# Patient Record
Sex: Male | Born: 1950
Health system: Southern US, Community
[De-identification: ages and names within clinical notes are randomized; demographics above are authoritative.]

## PROBLEM LIST (undated history)

## (undated) DIAGNOSIS — I499 Cardiac arrhythmia, unspecified: Secondary | ICD-10-CM

## (undated) DIAGNOSIS — E7800 Pure hypercholesterolemia, unspecified: Secondary | ICD-10-CM

## (undated) DIAGNOSIS — Z87442 Personal history of urinary calculi: Secondary | ICD-10-CM

## (undated) DIAGNOSIS — R112 Nausea with vomiting, unspecified: Secondary | ICD-10-CM

## (undated) DIAGNOSIS — I10 Essential (primary) hypertension: Secondary | ICD-10-CM

## (undated) DIAGNOSIS — K219 Gastro-esophageal reflux disease without esophagitis: Secondary | ICD-10-CM

## (undated) DIAGNOSIS — Z9889 Other specified postprocedural states: Secondary | ICD-10-CM

## (undated) DIAGNOSIS — K759 Inflammatory liver disease, unspecified: Secondary | ICD-10-CM

## (undated) DIAGNOSIS — I4891 Unspecified atrial fibrillation: Secondary | ICD-10-CM

## (undated) HISTORY — PX: LITHOTRIPSY: SUR834

## (undated) HISTORY — PX: HYDROCELE EXCISION / REPAIR: SUR1145

## (undated) HISTORY — PX: STONE EXTRACTION WITH BASKET: SHX5318

## (undated) HISTORY — PX: CHOLECYSTECTOMY: SHX55

## (undated) HISTORY — DX: Unspecified atrial fibrillation: I48.91

## (undated) HISTORY — PX: TONSILLECTOMY: SUR1361

## (undated) HISTORY — DX: Essential (primary) hypertension: I10

## (undated) HISTORY — PX: APPENDECTOMY: SHX54

## (undated) HISTORY — DX: Pure hypercholesterolemia, unspecified: E78.00

---

## 1999-11-13 DIAGNOSIS — I499 Cardiac arrhythmia, unspecified: Secondary | ICD-10-CM

## 1999-11-13 HISTORY — DX: Cardiac arrhythmia, unspecified: I49.9

## 2009-03-28 ENCOUNTER — Encounter: Admission: RE | Admit: 2009-03-28 | Discharge: 2009-03-28 | Payer: Self-pay | Admitting: Family Medicine

## 2009-10-19 ENCOUNTER — Encounter
Admission: RE | Admit: 2009-10-19 | Discharge: 2009-10-19 | Payer: Self-pay | Admitting: Physical Medicine and Rehabilitation

## 2010-12-03 ENCOUNTER — Encounter: Payer: Self-pay | Admitting: Physical Medicine and Rehabilitation

## 2014-12-20 DIAGNOSIS — E782 Mixed hyperlipidemia: Secondary | ICD-10-CM | POA: Diagnosis not present

## 2014-12-31 DIAGNOSIS — Z6827 Body mass index (BMI) 27.0-27.9, adult: Secondary | ICD-10-CM | POA: Diagnosis not present

## 2015-01-05 DIAGNOSIS — Z6827 Body mass index (BMI) 27.0-27.9, adult: Secondary | ICD-10-CM | POA: Diagnosis not present

## 2015-01-05 DIAGNOSIS — R05 Cough: Secondary | ICD-10-CM | POA: Diagnosis not present

## 2015-01-05 DIAGNOSIS — J0110 Acute frontal sinusitis, unspecified: Secondary | ICD-10-CM | POA: Diagnosis not present

## 2015-08-03 ENCOUNTER — Ambulatory Visit: Payer: Self-pay | Admitting: Orthopedic Surgery

## 2015-08-03 NOTE — Patient Instructions (Addendum)
Bernie Ransford  08/03/2015   Your procedure is scheduled on: 08-05-15 Friday  Report to [facility]  Entrance take [facility]  elevators to 3rd floor to  [place] at    0830 AM.  Call this number if you have problems the morning of surgery [phone]   Remember: ONLY 1 PERSON MAY GO WITH YOU TO SHORT STAY TO GET  READY MORNING OF [place].  Do not eat food or drink liquids :After Midnight.     Take these medicines the morning of surgery with A SIP OF WATER: none  DO NOT TAKE ANY DIABETIC MEDICATIONS DAY OF YOUR SURGERY                               You may not have any metal on your body including hair pins and              piercings  Do not wear jewelry, make-up, lotions, powders or perfumes, deodorant             Do not wear nail polish.  Do not shave  48 hours prior to surgery.              Men may shave face and neck.   Do not bring valuables to the hospital. [place].  Contacts, dentures or bridgework may not be worn into surgery.  Leave suitcase in the car. After surgery it may be brought to your room.     Patients discharged the day of surgery will not be allowed to drive home.  Name and phone number of your driver:  Special Instructions: N/[facility] - Preparing for Surgery Before surgery, you can play an important role.  Because skin is not sterile, your skin needs to be as free of germs as possible.  You can reduce the number of germs on your skin by washing with CHG (chlorahexidine gluconate) soap before surgery.  CHG is an antiseptic cleaner which kills germs and bonds with the skin to continue killing germs even after washing. Please DO NOT use if you have an allergy to CHG or antibacterial soaps.  If your skin becomes reddened/irritated stop using the CHG and inform your nurse when you arrive at Short Stay. Do not shave (including legs and underarms) for at least 48 hours prior to  the first CHG shower.  You may shave your face/neck. Please follow these instructions carefully:  1.  Shower with CHG Soap the night before surgery and the  morning of Surgery.  2.  If you choose to wash your hair, wash your hair first as usual with your  normal  shampoo.  3.  After you shampoo, rinse your hair and body thoroughly to remove the  shampoo.                           4.  Use CHG as you would any other liquid soap.  You can apply chg directly  to the skin and wash                       Gently with a scrungie or clean washcloth.  5.  Apply the CHG  Soap to your body ONLY FROM THE NECK DOWN.   Do not use on face/ open                           Wound or open sores. Avoid contact with eyes, ears mouth and genitals (private parts).                       Wash face,  Genitals (private parts) with your normal soap.             6.  Wash thoroughly, paying special attention to the area where your surgery  will be performed.  7.  Thoroughly rinse your body with warm water from the neck down.  8.  DO NOT shower/wash with your normal soap after using and rinsing off  the CHG Soap.                9.  Pat yourself dry with a clean towel.            10.  Wear clean pajamas.            11.  Place clean sheets on your bed the night of your first shower and do not  sleep with pets. Day of Surgery : Do not apply any lotions/deodorants the morning of surgery.  Please wear clean clothes to the hospital/surgery center.  FAILURE TO FOLLOW THESE INSTRUCTIONS MAY RESULT IN THE CANCELLATION OF YOUR SURGERY PATIENT SIGNATURE_________________________________  NURSE SIGNATURE__________________________________  ________________________________________________________________________               Please read over the following fact sheets you were given: _____________________________________________________________________

## 2015-08-03 NOTE — Progress Notes (Signed)
Please put orders in Epic surgery 08-05-15 pre op 08-04-15 Thanks

## 2015-08-03 NOTE — H&P (Signed)
Alexander Bowen is an 64 y.o. male.   Chief Complaint: left knee pain HPI: The patient is a 64 year old male who presents to the practice today for a transition into care. The patient is transitioning into care from another physician and a summary of care was reviewed .  The patient reports left knee symptoms including: swelling which began 8 day(s) ago following a specific injury. The injury occurred 07/20/2015 due to a fall while the patient was at work. The patient describes the severity of the symptoms as 0 / 10 on an analog pain scale. Prior to being seen today the patient was previously evaluated in urgent care [facility] Previous work-up for this problem has included knee x-rays. Past treatment for this problem has included application of ice, knee immobilizer and nonsteroidal anti-inflammatory drugs (Ibuprofen). Current treatment includes application of ice and knee immobilizer (and crutches). Note for "Knee pain": The patient is out of work.  No past medical history on file.  No past surgical history on file.  No family history on file. Social History:  has no tobacco, alcohol, and drug history on file.  Allergies: Allergies not on file   (Not in a hospital admission)  No results found for this or any previous visit (from the past 48 hour(s)). No results found.  Review of Systems  Constitutional: Negative.   HENT: Negative.   Eyes: Negative.   Respiratory: Negative.   Cardiovascular: Negative.   Gastrointestinal: Negative.   Genitourinary: Negative.   Musculoskeletal: Positive for joint pain.  Skin: Negative.   Neurological: Negative.   Psychiatric/Behavioral: Negative.     There were no vitals taken for this visit. Physical Exam  Constitutional: He is oriented to person, place, and time. He appears well-developed. He appears distressed.  HENT:  Head: Normocephalic.  Eyes: Pupils are equal, round, and reactive to light.  Neck: Normal range of motion.  Cardiovascular:  Normal rate.   Respiratory: Effort normal.  GI: Soft.  Musculoskeletal:  On examination of the knee, he has moderate effusion, prepatellar swelling is noted as well. There is a healing abrasion over the anterior aspect of the knee. He is neurovascularly intact without evidence of DVT. The ipsilateral hip and ankle exam is unremarkable.  Outside medical records were reviewed. He had a fall onto his right hand, he had fallen at work onto his knee and then fracture comminuted of the patella was noted. He has been placed in a knee immobilizer.  He is tender to palpation over the patella, nontender posteriorly. No evidence of DVT.  Compartments are soft.  Neurological: He is alert and oriented to person, place, and time.    AP and lateral radiographs today demonstrate a comminuted displaced patella fracture, minimum of four part with incongruity within the joint itself.  Assessment/Plan One week status post fall at work with a closed patella fracture with the residual soft tissue swelling, anterior abrasion, neurologically intact.  Extensive discussion with Mr. Baetz concerning his current pathology, relevant anatomy and treatment options. At this point in time, we discussed the patellar fracture and displacement and the requirement for a surgical reconstruction, to reapproximate the patella, to reconstitute the quadriceps tendon mechanism. He does have residual soft tissue swelling, we discussed elevation, isometric quads. We will have him set up for an open reduction and internal fixation next week to get some continued reduction into the soft tissue swelling and healing of the abrasion, which is right over the anterior aspect of the knee in the region  of the proposed incision. Out of work. He is having minimal pain, he is taking minimal analgesics. Aspirin for DVT prophylaxis. He is otherwise healthy. No chest pain, shortness of breath, history of heart attack, MRSA infection, clots, diabetes or  stroke. He is here with a friend of his. We discussed obtaining a brace afterwards. He will be weightbearing in an extended brace. We will begin protected range of motion. We did discuss the probable three to four months until the maximum medical improvement and the probable residual restriction in flexion, the possibility of manipulation. 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 I would recommend tobacco cessation indicating deleterious side effects upon healing. He understands. Any change in the interim, he is to call, we will get approval from his workmen's compensation facility.  Plan left patella ORIF  BISSELL, JACLYN M. PA-C for Dr. Tonita Cong 08/03/2015, 5:05 PM

## 2015-08-04 ENCOUNTER — Ambulatory Visit (HOSPITAL_COMMUNITY)
Admission: RE | Admit: 2015-08-04 | Discharge: 2015-08-04 | Disposition: A | Discharge status: Home / Self Care | Payer: Worker's Compensation | Source: Ambulatory Visit | Attending: Orthopedic Surgery | Admitting: Orthopedic Surgery

## 2015-08-04 ENCOUNTER — Encounter (HOSPITAL_COMMUNITY): Payer: Self-pay

## 2015-08-04 ENCOUNTER — Encounter (HOSPITAL_COMMUNITY)
Admission: RE | Admit: 2015-08-04 | Discharge: 2015-08-04 | Disposition: A | Discharge status: Home / Self Care | Payer: Worker's Compensation | Source: Ambulatory Visit | Attending: Specialist | Admitting: Specialist

## 2015-08-04 DIAGNOSIS — Y990 Civilian activity done for income or pay: Secondary | ICD-10-CM | POA: Diagnosis not present

## 2015-08-04 DIAGNOSIS — K219 Gastro-esophageal reflux disease without esophagitis: Secondary | ICD-10-CM | POA: Diagnosis not present

## 2015-08-04 DIAGNOSIS — Z01811 Encounter for preprocedural respiratory examination: Secondary | ICD-10-CM

## 2015-08-04 DIAGNOSIS — M25562 Pain in left knee: Secondary | ICD-10-CM | POA: Diagnosis present

## 2015-08-04 DIAGNOSIS — S82042A Displaced comminuted fracture of left patella, initial encounter for closed fracture: Secondary | ICD-10-CM | POA: Diagnosis not present

## 2015-08-04 DIAGNOSIS — W19XXXA Unspecified fall, initial encounter: Secondary | ICD-10-CM | POA: Diagnosis not present

## 2015-08-04 HISTORY — DX: Gastro-esophageal reflux disease without esophagitis: K21.9

## 2015-08-04 HISTORY — DX: Other specified postprocedural states: Z98.890

## 2015-08-04 HISTORY — DX: Nausea with vomiting, unspecified: R11.2

## 2015-08-04 HISTORY — DX: Cardiac arrhythmia, unspecified: I49.9

## 2015-08-04 LAB — BASIC METABOLIC PANEL
Anion gap: 7 (ref 5–15)
BUN: 15 mg/dL (ref 6–20)
CO2: 27 mmol/L (ref 22–32)
Calcium: 9.3 mg/dL (ref 8.9–10.3)
Chloride: 104 mmol/L (ref 101–111)
Creatinine, Ser: 0.91 mg/dL (ref 0.61–1.24)
GFR calc Af Amer: >60 mL/min (ref >60)
GFR calc non Af Amer: >60 mL/min (ref >60)
Glucose, Bld: 92 mg/dL (ref 65–99)
Potassium: 4.3 mmol/L (ref 3.5–5.1)
Sodium: 138 mmol/L (ref 135–145)

## 2015-08-04 LAB — CBC
HCT: 43.4 % (ref 39.0–52.0)
Hemoglobin: 15.3 g/dL (ref 13.0–17.0)
MCH: 31.5 pg (ref 26.0–34.0)
MCHC: 35.3 g/dL (ref 30.0–36.0)
MCV: 89.5 fL (ref 78.0–100.0)
Platelets: 266 10*3/uL (ref 150–400)
RBC: 4.85 MIL/uL (ref 4.22–5.81)
RDW: 13.8 % (ref 11.5–15.5)
WBC: 8.6 10*3/uL (ref 4.0–10.5)

## 2015-08-05 ENCOUNTER — Encounter (HOSPITAL_COMMUNITY)
Admission: RE | Disposition: A | Discharge status: Home / Self Care | Payer: Self-pay | Source: Ambulatory Visit | Attending: Specialist

## 2015-08-05 ENCOUNTER — Ambulatory Visit (HOSPITAL_COMMUNITY)
Admission: RE | Admit: 2015-08-05 | Discharge: 2015-08-06 | Disposition: A | Discharge status: Home / Self Care | Payer: Worker's Compensation | Source: Ambulatory Visit | Attending: Specialist | Admitting: Specialist

## 2015-08-05 ENCOUNTER — Ambulatory Visit (HOSPITAL_COMMUNITY): Payer: Worker's Compensation | Admitting: Anesthesiology

## 2015-08-05 ENCOUNTER — Encounter (HOSPITAL_COMMUNITY): Payer: Self-pay | Admitting: *Deleted

## 2015-08-05 ENCOUNTER — Ambulatory Visit (HOSPITAL_COMMUNITY): Payer: Worker's Compensation

## 2015-08-05 DIAGNOSIS — Y990 Civilian activity done for income or pay: Secondary | ICD-10-CM | POA: Insufficient documentation

## 2015-08-05 DIAGNOSIS — K219 Gastro-esophageal reflux disease without esophagitis: Secondary | ICD-10-CM | POA: Diagnosis not present

## 2015-08-05 DIAGNOSIS — S82009A Unspecified fracture of unspecified patella, initial encounter for closed fracture: Secondary | ICD-10-CM | POA: Diagnosis present

## 2015-08-05 DIAGNOSIS — W19XXXA Unspecified fall, initial encounter: Secondary | ICD-10-CM | POA: Insufficient documentation

## 2015-08-05 DIAGNOSIS — T148XXA Other injury of unspecified body region, initial encounter: Secondary | ICD-10-CM

## 2015-08-05 DIAGNOSIS — S82042A Displaced comminuted fracture of left patella, initial encounter for closed fracture: Secondary | ICD-10-CM | POA: Diagnosis not present

## 2015-08-05 HISTORY — DX: Unspecified fracture of unspecified patella, initial encounter for closed fracture: S82.009A

## 2015-08-05 HISTORY — PX: ORIF PATELLA: SHX5033

## 2015-08-05 SURGERY — OPEN REDUCTION INTERNAL FIXATION (ORIF) PATELLA
Anesthesia: General | Site: Knee | Laterality: Left

## 2015-08-05 MED ORDER — ONDANSETRON HCL 4 MG PO TABS: 4.0000 mg | ORAL_TABLET | Freq: Four times a day (QID) | ORAL | Status: DC | PRN

## 2015-08-05 MED ORDER — ACETAMINOPHEN 325 MG PO TABS: 650.0000 mg | ORAL_TABLET | Freq: Four times a day (QID) | ORAL | Status: DC | PRN

## 2015-08-05 MED ORDER — DEXAMETHASONE SODIUM PHOSPHATE 10 MG/ML IJ SOLN
INTRAMUSCULAR | Status: DC | PRN
  Administered 2015-08-05: 10 mg via INTRAVENOUS

## 2015-08-05 MED ORDER — FENTANYL CITRATE (PF) 250 MCG/5ML IJ SOLN
INTRAMUSCULAR | Status: AC
  Filled 2015-08-05: qty 25

## 2015-08-05 MED ORDER — METOCLOPRAMIDE HCL 5 MG PO TABS: 5.0000 mg | ORAL_TABLET | Freq: Three times a day (TID) | ORAL | Status: DC | PRN

## 2015-08-05 MED ORDER — BISACODYL 5 MG PO TBEC: 5.0000 mg | DELAYED_RELEASE_TABLET | Freq: Every day | ORAL | Status: DC | PRN

## 2015-08-05 MED ORDER — BUPIVACAINE-EPINEPHRINE (PF) 0.5% -1:200000 IJ SOLN
INTRAMUSCULAR | Status: AC
  Filled 2015-08-05: qty 30

## 2015-08-05 MED ORDER — MIDAZOLAM HCL 5 MG/5ML IJ SOLN
INTRAMUSCULAR | Status: DC | PRN
Start: 2015-08-05 — End: 2015-08-05
  Administered 2015-08-05: 2 mg via INTRAVENOUS

## 2015-08-05 MED ORDER — PROPOFOL 10 MG/ML IV BOLUS
INTRAVENOUS | Status: DC | PRN
  Administered 2015-08-05: 200 mg via INTRAVENOUS

## 2015-08-05 MED ORDER — LACTATED RINGERS IV SOLN: INTRAVENOUS | Status: DC

## 2015-08-05 MED ORDER — ONDANSETRON HCL 4 MG/2ML IJ SOLN: 4.0000 mg | Freq: Four times a day (QID) | INTRAMUSCULAR | Status: DC | PRN

## 2015-08-05 MED ORDER — HYDROMORPHONE HCL 1 MG/ML IJ SOLN
INTRAMUSCULAR | Status: AC
  Filled 2015-08-05: qty 1

## 2015-08-05 MED ORDER — EPHEDRINE SULFATE 50 MG/ML IJ SOLN
INTRAMUSCULAR | Status: DC | PRN
  Administered 2015-08-05: 10 mg via INTRAVENOUS

## 2015-08-05 MED ORDER — SODIUM CHLORIDE 0.9 % IR SOLN
Status: AC
  Filled 2015-08-05: qty 1

## 2015-08-05 MED ORDER — OXYCODONE HCL 5 MG PO TABS
5.0000 mg | ORAL_TABLET | ORAL | Status: DC | PRN
  Administered 2015-08-05 – 2015-08-06 (×4): 10 mg via ORAL
  Filled 2015-08-05 (×4): qty 2

## 2015-08-05 MED ORDER — ONDANSETRON HCL 4 MG/2ML IJ SOLN
INTRAMUSCULAR | Status: AC
  Filled 2015-08-05: qty 2

## 2015-08-05 MED ORDER — POTASSIUM CHLORIDE IN NACL 20-0.45 MEQ/L-% IV SOLN
INTRAVENOUS | Status: DC
  Administered 2015-08-05: 17:00:00 via INTRAVENOUS
  Filled 2015-08-05 (×2): qty 1000

## 2015-08-05 MED ORDER — BUPIVACAINE-EPINEPHRINE (PF) 0.25% -1:200000 IJ SOLN
INTRAMUSCULAR | Status: AC
  Filled 2015-08-05: qty 30

## 2015-08-05 MED ORDER — LACTATED RINGERS IV SOLN
INTRAVENOUS | Status: DC
  Administered 2015-08-05: 1000 mL via INTRAVENOUS
  Administered 2015-08-05: 13:00:00 via INTRAVENOUS

## 2015-08-05 MED ORDER — METOCLOPRAMIDE HCL 5 MG/ML IJ SOLN: 5.0000 mg | Freq: Three times a day (TID) | INTRAMUSCULAR | Status: DC | PRN

## 2015-08-05 MED ORDER — CEFAZOLIN SODIUM-DEXTROSE 2-3 GM-% IV SOLR
INTRAVENOUS | Status: AC
  Filled 2015-08-05: qty 50

## 2015-08-05 MED ORDER — MIDAZOLAM HCL 2 MG/2ML IJ SOLN
INTRAMUSCULAR | Status: AC
  Filled 2015-08-05: qty 4

## 2015-08-05 MED ORDER — LIDOCAINE HCL (CARDIAC) 20 MG/ML IV SOLN
INTRAVENOUS | Status: DC | PRN
  Administered 2015-08-05: 100 mg via INTRAVENOUS

## 2015-08-05 MED ORDER — DEXAMETHASONE SODIUM PHOSPHATE 10 MG/ML IJ SOLN
INTRAMUSCULAR | Status: AC
  Filled 2015-08-05: qty 1

## 2015-08-05 MED ORDER — ASPIRIN EC 81 MG PO TBEC: 325.0000 mg | DELAYED_RELEASE_TABLET | Freq: Two times a day (BID) | ORAL | Status: DC

## 2015-08-05 MED ORDER — SENNOSIDES-DOCUSATE SODIUM 8.6-50 MG PO TABS: 1.0000 | ORAL_TABLET | Freq: Every evening | ORAL | Status: DC | PRN

## 2015-08-05 MED ORDER — ONDANSETRON HCL 4 MG/2ML IJ SOLN
INTRAMUSCULAR | Status: DC | PRN
  Administered 2015-08-05: 4 mg via INTRAVENOUS

## 2015-08-05 MED ORDER — HYDROMORPHONE HCL 1 MG/ML IJ SOLN
0.5000 mg | INTRAMUSCULAR | Status: DC | PRN
  Administered 2015-08-05 (×3): 0.5 mg via INTRAVENOUS

## 2015-08-05 MED ORDER — ACETAMINOPHEN 650 MG RE SUPP: 650.0000 mg | Freq: Four times a day (QID) | RECTAL | Status: DC | PRN

## 2015-08-05 MED ORDER — CEFAZOLIN SODIUM-DEXTROSE 2-3 GM-% IV SOLR
2.0000 g | Freq: Four times a day (QID) | INTRAVENOUS | Status: AC
  Administered 2015-08-05 – 2015-08-06 (×3): 2 g via INTRAVENOUS
  Filled 2015-08-05 (×3): qty 50

## 2015-08-05 MED ORDER — OXYCODONE-ACETAMINOPHEN 5-325 MG PO TABS: 1.0000 | ORAL_TABLET | ORAL | Status: DC | PRN

## 2015-08-05 MED ORDER — ENOXAPARIN SODIUM 40 MG/0.4ML ~~LOC~~ SOLN
40.0000 mg | SUBCUTANEOUS | Status: DC
  Administered 2015-08-06: 40 mg via SUBCUTANEOUS
  Filled 2015-08-05 (×2): qty 0.4

## 2015-08-05 MED ORDER — HYDROMORPHONE HCL 1 MG/ML IJ SOLN: 1.0000 mg | INTRAMUSCULAR | Status: DC | PRN

## 2015-08-05 MED ORDER — MAGNESIUM CITRATE PO SOLN: 1.0000 | Freq: Once | ORAL | Status: DC | PRN

## 2015-08-05 MED ORDER — CEFAZOLIN SODIUM-DEXTROSE 2-3 GM-% IV SOLR
2.0000 g | INTRAVENOUS | Status: AC
  Administered 2015-08-05: 2 g via INTRAVENOUS

## 2015-08-05 MED ORDER — DOCUSATE SODIUM 100 MG PO CAPS
100.0000 mg | ORAL_CAPSULE | Freq: Two times a day (BID) | ORAL | Status: DC
  Administered 2015-08-05 – 2015-08-06 (×2): 100 mg via ORAL

## 2015-08-05 MED ORDER — PROPOFOL 10 MG/ML IV BOLUS
INTRAVENOUS | Status: AC
  Filled 2015-08-05: qty 20

## 2015-08-05 MED ORDER — DOCUSATE SODIUM 100 MG PO CAPS: 100.0000 mg | ORAL_CAPSULE | Freq: Two times a day (BID) | ORAL | Status: DC | PRN

## 2015-08-05 MED ORDER — FENTANYL CITRATE (PF) 100 MCG/2ML IJ SOLN
INTRAMUSCULAR | Status: DC | PRN
  Administered 2015-08-05 (×5): 50 ug via INTRAVENOUS

## 2015-08-05 MED ORDER — RISAQUAD PO CAPS
1.0000 | ORAL_CAPSULE | Freq: Every day | ORAL | Status: DC
  Filled 2015-08-05 (×2): qty 1

## 2015-08-05 MED ORDER — SODIUM CHLORIDE 0.9 % IR SOLN
Status: DC | PRN
  Administered 2015-08-05: 500 mL

## 2015-08-05 MED ORDER — LIDOCAINE HCL (CARDIAC) 20 MG/ML IV SOLN
INTRAVENOUS | Status: AC
  Filled 2015-08-05: qty 5

## 2015-08-05 MED ORDER — BUPIVACAINE-EPINEPHRINE (PF) 0.5% -1:200000 IJ SOLN
INTRAMUSCULAR | Status: DC | PRN
Start: 2015-08-05 — End: 2015-08-05
  Administered 2015-08-05: 20 mL

## 2015-08-05 SURGICAL SUPPLY — 54 items
ANCHOR SUT KEITH ABD SZ2 STR (Anchor) IMPLANT
BAG ZIPLOCK 12X15 (MISCELLANEOUS) IMPLANT
BANDAGE ELASTIC 6 VELCRO ST LF (GAUZE/BANDAGES/DRESSINGS) ×2 IMPLANT
BANDAGE ESMARK 6X9 LF (GAUZE/BANDAGES/DRESSINGS) ×1 IMPLANT
BIT DRILL 2.8X128 (BIT) ×2 IMPLANT
BIT DRILL GOLD JC 2.5X95 (BIT) ×2 IMPLANT
BNDG ESMARK 6X9 LF (GAUZE/BANDAGES/DRESSINGS) ×2
CLOTH 2% CHLOROHEXIDINE 3PK (PERSONAL CARE ITEMS) ×2 IMPLANT
CUFF TOURN SGL QUICK 34 (TOURNIQUET CUFF) ×1
CUFF TRNQT CYL 34X4X40X1 (TOURNIQUET CUFF) ×1 IMPLANT
DRAPE C-ARM 42X120 X-RAY (DRAPES) IMPLANT
DRAPE ORTHO SPLIT 77X108 STRL (DRAPES) ×2
DRAPE SURG ORHT 6 SPLT 77X108 (DRAPES) ×2 IMPLANT
DRAPE U-SHAPE 47X51 STRL (DRAPES) ×2 IMPLANT
DRSG AQUACEL AG ADV 3.5X10 (GAUZE/BANDAGES/DRESSINGS) ×2 IMPLANT
DRSG PAD ABDOMINAL 8X10 ST (GAUZE/BANDAGES/DRESSINGS) IMPLANT
DURAPREP 26ML APPLICATOR (WOUND CARE) ×2 IMPLANT
ELECT REM PT RETURN 9FT ADLT (ELECTROSURGICAL) ×2
ELECTRODE REM PT RTRN 9FT ADLT (ELECTROSURGICAL) ×1 IMPLANT
GAUZE SPONGE 4X4 12PLY STRL (GAUZE/BANDAGES/DRESSINGS) IMPLANT
GLOVE BIOGEL PI IND STRL 7.0 (GLOVE) ×1 IMPLANT
GLOVE BIOGEL PI IND STRL 8 (GLOVE) ×1 IMPLANT
GLOVE BIOGEL PI INDICATOR 7.0 (GLOVE) ×1
GLOVE BIOGEL PI INDICATOR 8 (GLOVE) ×1
GLOVE SURG SS PI 7.0 STRL IVOR (GLOVE) ×2 IMPLANT
GLOVE SURG SS PI 7.5 STRL IVOR (GLOVE) ×2 IMPLANT
GLOVE SURG SS PI 8.0 STRL IVOR (GLOVE) ×2 IMPLANT
GOWN STRL REUS W/TWL XL LVL3 (GOWN DISPOSABLE) ×4 IMPLANT
KIT BASIN OR (CUSTOM PROCEDURE TRAY) ×2 IMPLANT
MANIFOLD NEPTUNE II (INSTRUMENTS) ×2 IMPLANT
NEEDLE HYPO 22GX1.5 SAFETY (NEEDLE) ×2 IMPLANT
NS IRRIG 1000ML POUR BTL (IV SOLUTION) ×2 IMPLANT
PACK TOTAL JOINT (CUSTOM PROCEDURE TRAY) ×2 IMPLANT
PAD ABD 8X10 STRL (GAUZE/BANDAGES/DRESSINGS) ×2 IMPLANT
PADDING CAST COTTON 6X4 STRL (CAST SUPPLIES) IMPLANT
PASSER SUT SWANSON 36MM LOOP (INSTRUMENTS) ×2 IMPLANT
POSITIONER SURGICAL ARM (MISCELLANEOUS) ×2 IMPLANT
SCREW CANC PT 4.0X35 (Screw) ×2 IMPLANT
SCREW CANC PT 4.0X40 (Screw) ×1 IMPLANT
SCREW CANC PT 4.0X50 (Screw) ×4 IMPLANT
SCREW CANC PT 40X14X4X6X (Screw) ×1 IMPLANT
SUT BONE WAX W31G (SUTURE) ×2 IMPLANT
SUT ETHIBOND NAB CT1 #1 30IN (SUTURE) IMPLANT
SUT FIBERWIRE #2 38 T-5 BLUE (SUTURE) ×4
SUT VIC AB 0 CT1 27 (SUTURE)
SUT VIC AB 0 CT1 27XBRD ANTBC (SUTURE) IMPLANT
SUT VIC AB 1 CT1 27 (SUTURE) ×2
SUT VIC AB 1 CT1 27XBRD ANTBC (SUTURE) ×2 IMPLANT
SUT VIC AB 2-0 CT1 27 (SUTURE) ×2
SUT VIC AB 2-0 CT1 TAPERPNT 27 (SUTURE) ×2 IMPLANT
SUTURE FIBERWR #2 38 T-5 BLUE (SUTURE) ×2 IMPLANT
SYRINGE 20CC LL (MISCELLANEOUS) ×2 IMPLANT
TOWEL OR 17X26 10 PK STRL BLUE (TOWEL DISPOSABLE) ×2 IMPLANT
TOWEL OR NON WOVEN STRL DISP B (DISPOSABLE) IMPLANT

## 2015-08-05 NOTE — Op Note (Signed)
NAMEMAHDI, FRYE NO.:  [id number]  MEDICAL RECORD NO.:  [id number]  LOCATION:  [facility]                         FACILITY:  [facility]  PHYSICIAN:  Susa Day, M.D.    DATE OF BIRTH:  May 27, 1951  DATE OF PROCEDURE:  08/05/2015 DATE OF DISCHARGE:                              OPERATIVE REPORT   PREOPERATIVE DIAGNOSIS:  Comminuted left patella fracture.  POSTOPERATIVE DIAGNOSIS:  Comminuted left patella fracture.  PROCEDURE PERFORMED: 1. Open reduction and internal fixation left patella. 2. Allograft and autograft bone graft.  ANESTHESIA:  General.  ASSISTANT:  Cleophas Dunker, PA.  HISTORY:  This is a pleasant 64 year old male fell on his knee sustaining comminuted fracture, displaced, indicated for open reduction and internal fixation.  Risks and benefits were discussed including bleeding, infection, damage to vascular structures, malunion, nonunion, suboptimal range of motion, DVT, PE, anesthetic complications, etc.  TECHNIQUE:  The patient in supine position, after induction of adequate anesthesia, 2 g Kefzol, left lower extremity was prepped and draped and exsanguinated in usual sterile fashion.  Thigh tourniquet inflated to 250 mmHg.  Midline incision was made over the patella.  Subcutaneous tissue was dissected.  Electrocautery utilized to achieve hemostasis. We incised from an inferior to the tibial tubercle to the suprapatellar region.  Noted was comminution of the patella, breaching of the anterior cortex defect centrally.  We copiously irrigated the wound and curetted hematoma.  I then used a tenaculum to reduce the fracture in the horizontal and vertical planes.  Given the comminution, we performed multiple configurations to obtain optimal reconfiguration.  Then, placed a cannulated guide pin from cephalad to caudad.  For fixation, I also used a #1 Vicryl for ligamentotaxis through the periosteum anteriorly to bring together and provide  indirect reduction.  There was still a defect centrally with cortical disruption.  Comminution of the anterior course as I removed that and morselized this.  Then, we placed 2 horizontal screws, lateral to medial to hold the longitudinal fragments.  There was a quadrant for generalized fracture fragments.  The partially threaded cancellous screws were drilled with a 2.5 and measured the length and inserted the appropriate length screws.  We placed 40s, appropriate length partially threaded cancellous screws with excellent purchase noted.  Then placed a second guidewire from inferior to superior parallel with the patellofemoral articular surface, then the patella itself further adding fixation.  Following this, with the knee in extension, we do the defect centrally.  We bone grafted with [employer] bone graft putty, impacted into the defect centrally then we placed some cortical fragments on top of that.  I then took FiberWire and in a figure-of-eight tension band technique, threaded underneath the quadriceps tendon and patellar tendon, and then crossed it over the anterior cortex and then secured it with sliding surgical knot with excellent reinforcement and holding of the anterior cortical fragments. Copiously irrigated the wound in the AP and lateral plane, we had an optimal reduction of the fracture fragments in the AP and lateral plane. I was able to flex and extend to 45 degrees with continuity of the fixation.  Therefore, placed him back in extension, copiously irrigated the wound.  Tourniquet was deflated.  We cauterized any bleeders.  I then repaired some of the retinaculum that was disrupted as well and small incisions that were used to thread the guidewire inferiorly and superiorly.  We closed the small incisions with 2-0 Vicryl, subcu with 2- 0, and skin with staples.  Wound was dressed sterilely, secured with an Ace.  Placed a knee immobilizer, good capillary refill  distally, extubated and transported to the recovery room in satisfactory condition.  The patient tolerated the procedure well.  No complications. Minimal blood loss.  Tourniquet time 70 minutes.     Susa Day, M.D.     Geralynn Rile  D:  08/05/2015  T:  08/05/2015  Job:  [id number]

## 2015-08-05 NOTE — Anesthesia Postprocedure Evaluation (Signed)
  Anesthesia Post-op Note  Patient: Alexander Bowen  Procedure(s) Performed: Procedure(s): OPEN REDUCTION INTERNAL (ORIF) LEFT  FIXATION PATELLA (Left)  Patient Location: PACU  Anesthesia Type:General  Level of Consciousness: awake and alert   Airway and Oxygen Therapy: Patient Spontanous Breathing  Post-op Pain: moderate  Post-op Assessment: Post-op Vital signs reviewed and Patient's Cardiovascular Status Stable LLE Motor Response: Purposeful movement LLE Sensation: Full sensation          Post-op Vital Signs: Reviewed and stable  Last Vitals:  Filed Vitals:   08/05/15 1521  BP: 145/74  Pulse: 80  Temp: 37.3 C  Resp: 12    Complications: No apparent anesthesia complications

## 2015-08-05 NOTE — Brief Op Note (Signed)
08/05/2015  1:07 PM  PATIENT:  Alexander Bowen  64 y.o. male  PRE-OPERATIVE DIAGNOSIS:  PATELLA FRACTURE ON LEFT  POST-OPERATIVE DIAGNOSIS:  PATELLA FRACTURE ON LEFT  PROCEDURE:  Procedure(s): OPEN REDUCTION INTERNAL (ORIF) LEFT  FIXATION PATELLA (Left)  SURGEON:  Surgeon(s) and Role:    * Susa Day, MD - Primary  PHYSICIAN ASSISTANT:   ASSISTANTS: Bissell   ANESTHESIA:   general  EBL:  Total I/O In: 1000 [I.V.:1000] Out: -   BLOOD ADMINISTERED:none  DRAINS: none   LOCAL MEDICATIONS USED:  NONE  SPECIMEN:  No Specimen  DISPOSITION OF SPECIMEN:  N/A  COUNTS:  YES  TOURNIQUET:   Total Tourniquet Time Documented: Thigh (Left) - 71 minutes Total: Thigh (Left) - 71 minutes   DICTATION: .Other Dictation: Dictation Number  [id number]  PLAN OF CARE: Admit for overnight observation  PATIENT DISPOSITION:  PACU - hemodynamically stable.   Delay start of Pharmacological VTE agent (>24hrs) due to surgical blood loss or risk of bleeding: no

## 2015-08-05 NOTE — Anesthesia Procedure Notes (Signed)
Procedure Name: LMA Insertion Date/Time: 08/05/2015 11:26 AM Performed by: Lind Covert Pre-anesthesia Checklist: Patient identified, Emergency Drugs available, Suction available, Patient being monitored and Timeout performed Patient Re-evaluated:Patient Re-evaluated prior to inductionOxygen Delivery Method: Circle system utilized Preoxygenation: Pre-oxygenation with 100% oxygen Intubation Type: IV induction LMA: LMA inserted LMA Size: 5.0 Number of attempts: 1 Placement Confirmation: positive ETCO2 and breath sounds checked- equal and bilateral Tube secured with: Tape Dental Injury: Teeth and Oropharynx as per pre-operative assessment

## 2015-08-05 NOTE — Transfer of Care (Signed)
Immediate Anesthesia Transfer of Care Note  Patient: Alexander Bowen  Procedure(s) Performed: Procedure(s): OPEN REDUCTION INTERNAL (ORIF) LEFT  FIXATION PATELLA (Left)  Patient Location: PACU  Anesthesia Type:General  Level of Consciousness: sedated  Airway & Oxygen Therapy: Patient Spontanous Breathing and Patient connected to face mask oxygen  Post-op Assessment: Report given to RN and Post -op Vital signs reviewed and stable  Post vital signs: Reviewed and stable  Last Vitals:  Filed Vitals:   08/05/15 0825  BP: 156/86  Pulse: 73  Temp: 36.7 C  Resp: 16    Complications: No apparent anesthesia complications

## 2015-08-05 NOTE — Anesthesia Preprocedure Evaluation (Addendum)
Anesthesia Evaluation  Patient identified by MRN, date of birth, ID band Patient awake    Reviewed: Allergy & Precautions, H&P , Patient's Chart, lab work & pertinent test results, reviewed documented beta blocker date and time   Airway Mallampati: II  TM Distance: >3 FB Neck ROM: full    Dental no notable dental hx.    Pulmonary former smoker,    Pulmonary exam normal breath sounds clear to auscultation       Cardiovascular  Rhythm:regular Rate:Normal     Neuro/Psych    GI/Hepatic   Endo/Other    Renal/GU      Musculoskeletal   Abdominal   Peds  Hematology   Anesthesia Other Findings PONV Hx  Reproductive/Obstetrics                            Anesthesia Physical Anesthesia Plan  ASA: II  Anesthesia Plan: General   Post-op Pain Management:    Induction: Intravenous  Airway Management Planned: LMA  Additional Equipment:   Intra-op Plan:   Post-operative Plan: Extubation in OR  Informed Consent: I have reviewed the patients History and Physical, chart, labs and discussed the procedure including the risks, benefits and alternatives for the proposed anesthesia with the patient or authorized representative who has indicated his/her understanding and acceptance.   Dental Advisory Given and Dental advisory given  Plan Discussed with: CRNA and Surgeon  Anesthesia Plan Comments: (  Discussed general anesthesia, including possible nausea, instrumentation of airway, sore throat,pulmonary aspiration, etc. I asked if the were any outstanding questions, or  concerns before we proceeded. )       Anesthesia Quick Evaluation

## 2015-08-06 DIAGNOSIS — S82042A Displaced comminuted fracture of left patella, initial encounter for closed fracture: Secondary | ICD-10-CM | POA: Diagnosis not present

## 2015-08-06 LAB — BASIC METABOLIC PANEL
Anion gap: 9 (ref 5–15)
BUN: 13 mg/dL (ref 6–20)
CO2: 23 mmol/L (ref 22–32)
Calcium: 9 mg/dL (ref 8.9–10.3)
Chloride: 104 mmol/L (ref 101–111)
Creatinine, Ser: 0.98 mg/dL (ref 0.61–1.24)
GFR calc Af Amer: >60 mL/min (ref >60)
GFR calc non Af Amer: >60 mL/min (ref >60)
Glucose, Bld: 120 mg/dL — ABNORMAL HIGH (ref 65–99)
Potassium: 4.6 mmol/L (ref 3.5–5.1)
Sodium: 136 mmol/L (ref 135–145)

## 2015-08-06 NOTE — Evaluation (Signed)
Physical Therapy Evaluation Patient Details Name: Alexander Bowen MRN: [id number] DOB: [birth year] Today's Date: 08/06/2015   History of Present Illness  s/p ORIF patella fx;  Clinical Impression  Pt doing well; will need second session for stair training/safety; called [employer] regarding Bledsoe brace    Follow Up Recommendations No PT follow up    Equipment Recommendations       Recommendations for Other Services       Precautions / Restrictions Precautions Precautions: Other (comment) Required Braces or Orthoses: Knee Immobilizer - Left Restrictions Weight Bearing Restrictions: Yes LLE Weight Bearing: Partial weight bearing      Mobility  Bed Mobility Overal bed mobility: Needs Assistance Bed Mobility: Supine to Sit     Supine to sit: Min assist     General bed mobility comments: assist with LLE  Transfers Overall transfer level: Needs assistance Equipment used: Rolling walker (2 wheeled) Transfers: Sit to/from Stand Sit to Stand: Modified independent (Device/Increase time)            Ambulation/Gait Ambulation/Gait assistance: Supervision Ambulation Distance (Feet): 100 Feet Assistive device: Rolling walker (2 wheeled) Gait Pattern/deviations: Step-to pattern     General Gait Details: PWB L heel per orders, pt maintains well, occasional cue for RW safety  Stairs            Wheelchair Mobility    Modified Rankin (Stroke Patients Only)       Balance Overall balance assessment: Needs assistance   Sitting balance-Leahy Scale: Good       Standing balance-Leahy Scale: Fair Standing balance comment: briefly able to maintain static stand without walker/UE support                             Pertinent Vitals/Pain Pain Assessment: 0-10 Pain Location: L knee incision Pain Intervention(s): Limited activity within patient's tolerance;Monitored during session;Premedicated before session    Home Living Family/patient expects  to be discharged to:: Private residence Living Arrangements: Spouse/significant other   Type of Home: House Home Access: Stairs to enter   Technical brewer of Steps: 4 Home Layout: One level Home Equipment: Crutches;Walker - 2 wheels      Prior Function Level of Independence: Independent;Independent with assistive device(s)         Comments: crutches for 2 wks     Hand Dominance        Extremity/Trunk Assessment   Upper Extremity Assessment: Defer to OT evaluation           Lower Extremity Assessment: LLE deficits/detail   LLE Deficits / Details: ankle WFL; able to do SLR with brace in place     Communication   Communication: No difficulties  Cognition Arousal/Alertness: Awake/alert Behavior During Therapy: WFL for tasks assessed/performed Overall Cognitive Status: Within Functional Limits for tasks assessed                      General Comments      Exercises        Assessment/Plan    PT Assessment Patient needs continued PT services  PT Diagnosis Difficulty walking   PT Problem List Decreased mobility;Decreased knowledge of use of DME;Decreased knowledge of precautions  PT Treatment Interventions DME instruction;Gait training;Stair training   PT Goals (Current goals can be found in the Care Plan section) Acute Rehab PT Goals Patient Stated Goal: home soon PT Goal Formulation: With patient Time For Goal Achievement: 08/07/15 Potential to Achieve Goals: Good  Frequency Min 6X/week   Barriers to discharge        Co-evaluation               End of Session Equipment Utilized During Treatment: Gait belt Activity Tolerance: No increased pain;Patient tolerated treatment well Patient left: in chair;with call bell/phone within reach;with family/visitor present Nurse Communication: Mobility status    Functional Assessment Tool Used: clinical judgement Functional Limitation: Mobility: Walking and moving around Mobility:  Walking and Moving Around Current Status (E2800): At least 1 percent but less than 20 percent impaired, limited or restricted Mobility: Walking and Moving Around Goal Status [phone]): At least 1 percent but less than 20 percent impaired, limited or restricted    Time: 0935-1007 PT Time Calculation (min) (ACUTE ONLY): 32 min   Charges:   PT Evaluation $Initial PT Evaluation Tier I: 1 Procedure PT Treatments $Gait Training: 8-22 mins   PT G Codes:   PT G-Codes **NOT FOR INPATIENT CLASS** Functional Assessment Tool Used: clinical judgement Functional Limitation: Mobility: Walking and moving around Mobility: Walking and Moving Around Current Status (H1505): At least 1 percent but less than 20 percent impaired, limited or restricted Mobility: Walking and Moving Around Goal Status [phone]): At least 1 percent but less than 20 percent impaired, limited or restricted    [facility] 08/06/2015, 10:10 AM

## 2015-08-06 NOTE — Progress Notes (Signed)
Subjective: 1 Day Post-Op Procedure(s) (LRB): OPEN REDUCTION INTERNAL (ORIF) LEFT  FIXATION PATELLA (Left) Patient reports pain as 2 on 0-10 scale.    Objective: Vital signs in last 24 hours: Temp:  [98.1 F (36.7 C)-99.2 F (37.3 C)] 98.3 F (36.8 C) (09/24 0658) Pulse Rate:  [69-84] 69 (09/24 0658) Resp:  [12-19] 14 (09/24 0658) BP: (121-173)/(56-88) 123/56 mmHg (09/24 0658) SpO2:  [94 %-100 %] 100 % (09/24 0658) Weight:  [83.717 kg (184 lb 9 oz)-84 kg (185 lb 3 oz)] 84 kg (185 lb 3 oz) (09/23 1521)  Intake/Output from previous day: 09/23 0701 - 09/24 0700 In: 1980 [P.O.:480; I.V.:1500] Out: 1400 [Urine:1400] Intake/Output this shift:     Recent Labs  08/04/15 1135  HGB 15.3    Recent Labs  08/04/15 1135  WBC 8.6  RBC 4.85  HCT 43.4  PLT 266    Recent Labs  08/04/15 1135 08/06/15 0500  NA 138 136  K 4.3 4.6  CL 104 104  CO2 27 23  BUN 15 13  CREATININE 0.91 0.98  GLUCOSE 92 120*  CALCIUM 9.3 9.0   No results for input(s): LABPT, INR in the last 72 hours.  Neurologically intact Intact pulses distally Dorsiflexion/Plantar flexion intact Compartment soft  Assessment/Plan: 1 Day Post-Op Procedure(s) (LRB): OPEN REDUCTION INTERNAL (ORIF) LEFT  FIXATION PATELLA (Left) Advance diet Up with therapy Discharge home with home health  Brace. Discussed with patient.  Roanna Reaves C 08/06/2015, 8:19 AM

## 2015-08-06 NOTE — Progress Notes (Signed)
   08/06/15 1200  PT Visit Information  Last PT Received On 08/06/15  Assistance Needed +1  PT Time Calculation  PT Start Time (ACUTE ONLY) 1246  PT Stop Time (ACUTE ONLY) 1256  PT Time Calculation (min) (ACUTE ONLY) 10 min  Subjective Data  Patient Stated Goal home soon  Precautions  Precautions Other (comment)  Precaution Comments Bledsoe brace at all times, no ROM R knee  Required Braces or Orthoses Other Brace/Splint  Other Brace/Splint bledsoe  Restrictions  Weight Bearing Restrictions Yes  LLE Weight Bearing PWB  LLE Partial Weight Bearing Percentage or Pounds 50% on heel  Pain Assessment  Pain Assessment 0-10  Pain Score 5  Pain Location L knee  Pain Descriptors / Indicators Grimacing;Guarding;Burning  Pain Intervention(s) Limited activity within patient's tolerance;Monitored during session  Cognition  Arousal/Alertness Awake/alert  Behavior During Therapy WFL for tasks assessed/performed  Overall Cognitive Status Within Functional Limits for tasks assessed  Bed Mobility  General bed mobility comments in chair  Transfers  Overall transfer level Needs assistance  Equipment used Crutches  Transfers Sit to/from Stand  Sit to Stand Supervision  General transfer comment cues for safety and technique when using crutches  Ambulation/Gait  Ambulation/Gait assistance Min guard;Supervision  Ambulation Distance (Feet) 60 Feet  Assistive device Crutches  Gait Pattern/deviations Step-to pattern;Trunk flexed;Wide base of support  General Gait Details cues to keep both feet inside crutches  Stairs Yes  Stairs assistance Min guard  Stair Management Forwards;With crutches  Number of Stairs 5  General stair comments cues for technique  Balance  Standing balance-Leahy Scale Fair  PT - End of Session  Equipment Utilized During Treatment Gait belt  Activity Tolerance No increased pain;Patient tolerated treatment well  Patient left in chair;with call bell/phone within reach;with  family/visitor present  Nurse Communication Mobility status  PT - Assessment/Plan  PT Plan Current plan remains appropriate  Acute Rehab PT Goals  PT Goal Formulation With patient  Time For Goal Achievement 08/07/15  Potential to Achieve Goals Good  PT G-Codes **NOT FOR INPATIENT CLASS**  Functional Assessment Tool Used clinical judgement  Mobility: Walking and Moving Around Current Status (O8786) CI  Mobility: Walking and Moving Around Goal Status (V6720) CI  PT General Charges  $$ ACUTE PT VISIT 1 Procedure  PT Treatments  $Gait Training 8-22 mins

## 2015-08-06 NOTE — Discharge Instructions (Signed)
Take 325mg  aspirin twice a day with a meal. Knee immobilizer on all times with knee in full extension (straight) Partial Weight bear on heel only left leg with walker. See Dr. Tonita Cong in 2 weeks Ice and elevate left leg toes above the nose 5-6x/day 20-30 min each to reduce swelling

## 2015-08-06 NOTE — Discharge Summary (Signed)
Patient ID: Alexander Bowen MRN: [id number] DOB/AGE: Dec 23, 1950 64 y.o.  Admit date: 08/05/2015 Discharge date: 08/06/2015  Admission Diagnoses:  Active Problems:   Patella fracture   Discharge Diagnoses:  Same  Past Medical History  Diagnosis Date  . PONV (postoperative nausea and vomiting)   . Dysrhythmia 2001    palpitations -checked out by Cardiologist  . GERD (gastroesophageal reflux disease)     Surgeries: Procedure(s): OPEN REDUCTION INTERNAL (ORIF) LEFT  FIXATION PATELLA on 08/05/2015   Consultants:    Discharged Condition: Improved  Hospital Course: Alexander Bowen is an 64 y.o. male who was admitted 08/05/2015 for operative treatment of L patella fracture. Patient has severe unremitting pain that affects sleep, daily activities, and work/hobbies. After pre-op clearance the patient was taken to the operating room on 08/05/2015 and underwent  Procedure(s): OPEN REDUCTION INTERNAL (ORIF) LEFT  FIXATION PATELLA.    Patient was given perioperative antibiotics: Anti-infectives    Start     Dose/Rate Route Frequency Ordered Stop   08/05/15 1800  ceFAZolin (ANCEF) IVPB 2 g/50 mL premix     2 g 100 mL/hr over 30 Minutes Intravenous Every 6 hours 08/05/15 1526 08/06/15 0729   08/05/15 1240  polymyxin B 500,000 Units, bacitracin 50,000 Units in sodium chloride irrigation 0.9 % 500 mL irrigation  Status:  Discontinued       As needed 08/05/15 1240 08/05/15 1333   08/05/15 0845  ceFAZolin (ANCEF) IVPB 2 g/50 mL premix     2 g 100 mL/hr over 30 Minutes Intravenous On call to O.R. 08/05/15 2353 08/05/15 1127       Patient was given sequential compression devices, early ambulation, and chemoprophylaxis to prevent DVT.  Patient benefited maximally from hospital stay and there were no complications.    Recent vital signs: Patient Vitals for the past 24 hrs:  BP Temp Pulse Resp SpO2 Height Weight  08/06/15 0658 (!) 123/56 mmHg 98.3 F (36.8 C) 69 14 100 % - -  08/06/15 0155  121/63 mmHg 98.4 F (36.9 C) 75 14 98 % - -  08/05/15 2210 (!) 141/70 mmHg 98.3 F (36.8 C) 75 14 98 % - -  08/05/15 1817 138/73 mmHg 98.9 F (37.2 C) 71 14 100 % - -  08/05/15 1700 (!) 156/77 mmHg 98.7 F (37.1 C) 75 14 98 % - -  08/05/15 1616 (!) 153/78 mmHg 98.5 F (36.9 C) 75 12 98 % - -  08/05/15 1521 (!) 145/74 mmHg 99.2 F (37.3 C) 80 12 97 % 5\' 10"  (1.778 m) 84 kg (185 lb 3 oz)  08/05/15 1500 (!) 146/76 mmHg 98.9 F (37.2 C) 71 12 100 % - -  08/05/15 1445 (!) 149/72 mmHg - 71 12 99 % - -  08/05/15 1430 (!) 151/74 mmHg 99 F (37.2 C) 76 14 94 % - -  08/05/15 1420 - - 79 14 99 % - -  08/05/15 1415 (!) 158/78 mmHg - 82 19 99 % - -  08/05/15 1404 - - 81 14 99 % - -  08/05/15 1400 (!) 157/80 mmHg - 79 17 100 % - -  08/05/15 1349 - - 81 16 100 % - -  08/05/15 1345 (!) 162/79 mmHg - 84 13 100 % - -  08/05/15 1330 (!) 170/88 mmHg - 78 16 100 % - -  08/05/15 1326 (!) 173/64 mmHg 99.2 F (37.3 C) 78 12 98 % - -  08/05/15 6144 - - - - - 5\' 10"  (  1.778 m) 83.717 kg (184 lb 9 oz)     Recent laboratory studies:  Recent Labs  08/04/15 1135 08/06/15 0500  WBC 8.6  --   HGB 15.3  --   HCT 43.4  --   PLT 266  --   NA 138 136  K 4.3 4.6  CL 104 104  CO2 27 23  BUN 15 13  CREATININE 0.91 0.98  GLUCOSE 92 120*  CALCIUM 9.3 9.0     Discharge Medications:     Medication List    TAKE these medications        aspirin EC 81 MG tablet  Take 4 tablets (325 mg total) by mouth 2 (two) times daily.     docusate sodium 100 MG capsule  Commonly known as:  COLACE  Take 1 capsule (100 mg total) by mouth 2 (two) times daily as needed for mild constipation.     oxyCODONE-acetaminophen 5-325 MG per tablet  Commonly known as:  PERCOCET  Take 1 tablet by mouth every 4 (four) hours as needed.     simvastatin 40 MG tablet  Commonly known as:  ZOCOR  Take 40 mg by mouth at bedtime.        Diagnostic Studies: Dg Chest 2 View  08/04/2015   CLINICAL DATA:  Patellar surgery.  EXAM:  CHEST  2 VIEW  COMPARISON:  None.  FINDINGS: Mediastinum and hilar structures normal. Lungs are clear. Heart size normal. No pleural effusion or pneumothorax.  IMPRESSION: No acute cardiopulmonary disease.   Electronically Signed   By: Marcello Moores  Register   On: 08/04/2015 14:04   Dg Knee 4 Views W/patella Left  08/05/2015   CLINICAL DATA:  Fracture fixation.  EXAM: DG C-ARM 1-60 MIN-NO REPORT; LEFT KNEE - COMPLETE 4+ VIEW  COMPARISON:  None.  FINDINGS: Fluoroscopy time is recorded as 1 minutes 29 seconds.  To fluoroscopy images of the left knee demonstrate multiple screw fixation of patellar fracture. The alignment is anatomic.  IMPRESSION: Intraoperative fluoroscopic images from patellar fracture fixation.   Electronically Signed   By: Fidela Salisbury M.D.   On: 08/05/2015 13:31   Dg C-arm 1-60 Min-no Report  08/05/2015   CLINICAL DATA: surgery   C-ARM 1-60 MINUTES  Fluoroscopy was utilized by the requesting physician.  No radiographic  interpretation.     Disposition: Final discharge disposition not confirmed      Discharge Instructions    Call MD / Call 911    Complete by:  As directed   If you experience chest pain or shortness of breath, CALL 911 and be transported to the hospital emergency room.  If you develope a fever above 101 F, pus (white drainage) or increased drainage or redness at the wound, or calf pain, call your surgeon's office.     Constipation Prevention    Complete by:  As directed   Drink plenty of fluids.  Prune juice may be helpful.  You may use a stool softener, such as Colace (over the counter) 100 mg twice a day.  Use MiraLax (over the counter) for constipation as needed.     Diet - low sodium heart healthy    Complete by:  As directed      Increase activity slowly as tolerated    Complete by:  As directed            Follow-up Information    Follow up with BEANE,JEFFREY C, MD In 2 weeks.   Specialty:  Orthopedic Surgery  Why:  For suture removal   Contact  information:   [address] [place] [address] [phone]        Signed: Cecilie Kicks. 08/06/2015, 8:47 AM

## 2015-09-27 DIAGNOSIS — H6590 Unspecified nonsuppurative otitis media, unspecified ear: Secondary | ICD-10-CM | POA: Diagnosis not present

## 2015-09-27 DIAGNOSIS — Z6826 Body mass index (BMI) 26.0-26.9, adult: Secondary | ICD-10-CM | POA: Diagnosis not present

## 2015-10-26 DIAGNOSIS — E782 Mixed hyperlipidemia: Secondary | ICD-10-CM | POA: Diagnosis not present

## 2015-11-03 DIAGNOSIS — E782 Mixed hyperlipidemia: Secondary | ICD-10-CM | POA: Diagnosis not present

## 2015-11-03 DIAGNOSIS — Z23 Encounter for immunization: Secondary | ICD-10-CM | POA: Diagnosis not present

## 2015-11-03 DIAGNOSIS — Z1389 Encounter for screening for other disorder: Secondary | ICD-10-CM | POA: Diagnosis not present

## 2015-11-03 DIAGNOSIS — M67439 Ganglion, unspecified wrist: Secondary | ICD-10-CM | POA: Diagnosis not present

## 2015-12-27 DIAGNOSIS — K1120 Sialoadenitis, unspecified: Secondary | ICD-10-CM | POA: Diagnosis not present

## 2016-02-14 DIAGNOSIS — M1812 Unilateral primary osteoarthritis of first carpometacarpal joint, left hand: Secondary | ICD-10-CM | POA: Diagnosis not present

## 2016-02-14 DIAGNOSIS — M67432 Ganglion, left wrist: Secondary | ICD-10-CM | POA: Diagnosis not present

## 2016-04-25 DIAGNOSIS — Z125 Encounter for screening for malignant neoplasm of prostate: Secondary | ICD-10-CM | POA: Diagnosis not present

## 2016-04-25 DIAGNOSIS — E782 Mixed hyperlipidemia: Secondary | ICD-10-CM | POA: Diagnosis not present

## 2016-05-03 DIAGNOSIS — Z1211 Encounter for screening for malignant neoplasm of colon: Secondary | ICD-10-CM | POA: Diagnosis not present

## 2016-05-03 DIAGNOSIS — Z6826 Body mass index (BMI) 26.0-26.9, adult: Secondary | ICD-10-CM | POA: Diagnosis not present

## 2016-05-03 DIAGNOSIS — Z1389 Encounter for screening for other disorder: Secondary | ICD-10-CM | POA: Diagnosis not present

## 2016-05-03 DIAGNOSIS — Z0000 Encounter for general adult medical examination without abnormal findings: Secondary | ICD-10-CM | POA: Diagnosis not present

## 2016-07-06 ENCOUNTER — Other Ambulatory Visit: Payer: Self-pay

## 2016-07-10 DIAGNOSIS — L570 Actinic keratosis: Secondary | ICD-10-CM | POA: Diagnosis not present

## 2016-07-10 DIAGNOSIS — L821 Other seborrheic keratosis: Secondary | ICD-10-CM | POA: Diagnosis not present

## 2016-07-10 DIAGNOSIS — L820 Inflamed seborrheic keratosis: Secondary | ICD-10-CM | POA: Diagnosis not present

## 2016-07-10 DIAGNOSIS — D485 Neoplasm of uncertain behavior of skin: Secondary | ICD-10-CM | POA: Diagnosis not present

## 2016-08-22 DIAGNOSIS — I499 Cardiac arrhythmia, unspecified: Secondary | ICD-10-CM | POA: Diagnosis not present

## 2016-08-22 DIAGNOSIS — I480 Paroxysmal atrial fibrillation: Secondary | ICD-10-CM | POA: Diagnosis not present

## 2016-08-22 DIAGNOSIS — I4892 Unspecified atrial flutter: Secondary | ICD-10-CM | POA: Diagnosis not present

## 2016-08-23 DIAGNOSIS — E785 Hyperlipidemia, unspecified: Secondary | ICD-10-CM | POA: Diagnosis not present

## 2016-08-23 DIAGNOSIS — Z79899 Other long term (current) drug therapy: Secondary | ICD-10-CM | POA: Diagnosis not present

## 2016-08-23 DIAGNOSIS — I4892 Unspecified atrial flutter: Secondary | ICD-10-CM | POA: Diagnosis present

## 2016-08-23 DIAGNOSIS — G4733 Obstructive sleep apnea (adult) (pediatric): Secondary | ICD-10-CM | POA: Diagnosis not present

## 2016-08-23 DIAGNOSIS — I499 Cardiac arrhythmia, unspecified: Secondary | ICD-10-CM | POA: Diagnosis not present

## 2016-08-23 DIAGNOSIS — I480 Paroxysmal atrial fibrillation: Secondary | ICD-10-CM | POA: Diagnosis not present

## 2016-08-23 DIAGNOSIS — Z87891 Personal history of nicotine dependence: Secondary | ICD-10-CM | POA: Diagnosis not present

## 2016-08-23 DIAGNOSIS — E7800 Pure hypercholesterolemia, unspecified: Secondary | ICD-10-CM | POA: Diagnosis present

## 2016-08-23 DIAGNOSIS — Z87442 Personal history of urinary calculi: Secondary | ICD-10-CM | POA: Diagnosis not present

## 2016-08-23 DIAGNOSIS — Z7982 Long term (current) use of aspirin: Secondary | ICD-10-CM | POA: Diagnosis not present

## 2016-08-23 DIAGNOSIS — F17211 Nicotine dependence, cigarettes, in remission: Secondary | ICD-10-CM | POA: Diagnosis not present

## 2016-08-23 DIAGNOSIS — I4891 Unspecified atrial fibrillation: Secondary | ICD-10-CM | POA: Diagnosis not present

## 2016-08-23 DIAGNOSIS — R002 Palpitations: Secondary | ICD-10-CM | POA: Diagnosis not present

## 2016-08-23 DIAGNOSIS — Z8249 Family history of ischemic heart disease and other diseases of the circulatory system: Secondary | ICD-10-CM | POA: Diagnosis not present

## 2016-09-14 DIAGNOSIS — R310 Gross hematuria: Secondary | ICD-10-CM | POA: Diagnosis not present

## 2016-09-14 DIAGNOSIS — N3021 Other chronic cystitis with hematuria: Secondary | ICD-10-CM | POA: Diagnosis not present

## 2016-09-14 DIAGNOSIS — R100 Acute abdomen: Secondary | ICD-10-CM | POA: Diagnosis not present

## 2016-09-14 DIAGNOSIS — N400 Enlarged prostate without lower urinary tract symptoms: Secondary | ICD-10-CM | POA: Diagnosis not present

## 2016-09-20 DIAGNOSIS — R310 Gross hematuria: Secondary | ICD-10-CM | POA: Diagnosis not present

## 2016-09-20 DIAGNOSIS — N281 Cyst of kidney, acquired: Secondary | ICD-10-CM | POA: Diagnosis not present

## 2016-09-20 DIAGNOSIS — R911 Solitary pulmonary nodule: Secondary | ICD-10-CM | POA: Diagnosis not present

## 2016-09-20 DIAGNOSIS — R109 Unspecified abdominal pain: Secondary | ICD-10-CM | POA: Diagnosis not present

## 2016-09-21 DIAGNOSIS — K8010 Calculus of gallbladder with chronic cholecystitis without obstruction: Secondary | ICD-10-CM | POA: Diagnosis not present

## 2016-09-21 DIAGNOSIS — R310 Gross hematuria: Secondary | ICD-10-CM | POA: Diagnosis not present

## 2016-09-21 DIAGNOSIS — R100 Acute abdomen: Secondary | ICD-10-CM | POA: Diagnosis not present

## 2016-09-21 DIAGNOSIS — N400 Enlarged prostate without lower urinary tract symptoms: Secondary | ICD-10-CM | POA: Diagnosis not present

## 2016-09-27 DIAGNOSIS — I483 Typical atrial flutter: Secondary | ICD-10-CM | POA: Diagnosis not present

## 2016-09-27 DIAGNOSIS — I471 Supraventricular tachycardia, unspecified: Secondary | ICD-10-CM

## 2016-09-27 DIAGNOSIS — I4892 Unspecified atrial flutter: Secondary | ICD-10-CM | POA: Insufficient documentation

## 2016-09-27 DIAGNOSIS — R002 Palpitations: Secondary | ICD-10-CM | POA: Insufficient documentation

## 2016-09-27 HISTORY — DX: Supraventricular tachycardia: I47.1

## 2016-09-27 HISTORY — DX: Supraventricular tachycardia, unspecified: I47.10

## 2016-09-27 HISTORY — DX: Unspecified atrial flutter: I48.92

## 2016-09-27 HISTORY — DX: Palpitations: R00.2

## 2016-10-19 DIAGNOSIS — J302 Other seasonal allergic rhinitis: Secondary | ICD-10-CM | POA: Diagnosis not present

## 2016-10-25 DIAGNOSIS — J320 Chronic maxillary sinusitis: Secondary | ICD-10-CM | POA: Diagnosis not present

## 2016-10-25 DIAGNOSIS — Z6827 Body mass index (BMI) 27.0-27.9, adult: Secondary | ICD-10-CM | POA: Diagnosis not present

## 2016-11-23 DIAGNOSIS — J329 Chronic sinusitis, unspecified: Secondary | ICD-10-CM | POA: Diagnosis not present

## 2016-11-27 DIAGNOSIS — I471 Supraventricular tachycardia: Secondary | ICD-10-CM | POA: Diagnosis not present

## 2016-11-27 DIAGNOSIS — R002 Palpitations: Secondary | ICD-10-CM | POA: Diagnosis not present

## 2016-11-27 DIAGNOSIS — I483 Typical atrial flutter: Secondary | ICD-10-CM | POA: Diagnosis not present

## 2016-12-28 DIAGNOSIS — N522 Drug-induced erectile dysfunction: Secondary | ICD-10-CM | POA: Insufficient documentation

## 2016-12-28 DIAGNOSIS — I483 Typical atrial flutter: Secondary | ICD-10-CM | POA: Diagnosis not present

## 2016-12-28 DIAGNOSIS — I471 Supraventricular tachycardia: Secondary | ICD-10-CM | POA: Diagnosis not present

## 2016-12-28 HISTORY — DX: Drug-induced erectile dysfunction: N52.2

## 2017-01-01 DIAGNOSIS — E782 Mixed hyperlipidemia: Secondary | ICD-10-CM | POA: Diagnosis not present

## 2017-01-01 DIAGNOSIS — Z0000 Encounter for general adult medical examination without abnormal findings: Secondary | ICD-10-CM | POA: Diagnosis not present

## 2017-01-01 DIAGNOSIS — I4891 Unspecified atrial fibrillation: Secondary | ICD-10-CM | POA: Diagnosis not present

## 2017-01-01 DIAGNOSIS — R911 Solitary pulmonary nodule: Secondary | ICD-10-CM | POA: Diagnosis not present

## 2017-01-31 DIAGNOSIS — I4891 Unspecified atrial fibrillation: Secondary | ICD-10-CM | POA: Diagnosis not present

## 2017-01-31 DIAGNOSIS — M791 Myalgia: Secondary | ICD-10-CM | POA: Diagnosis not present

## 2017-01-31 DIAGNOSIS — E782 Mixed hyperlipidemia: Secondary | ICD-10-CM | POA: Diagnosis not present

## 2017-01-31 DIAGNOSIS — R911 Solitary pulmonary nodule: Secondary | ICD-10-CM | POA: Diagnosis not present

## 2017-02-06 DIAGNOSIS — N522 Drug-induced erectile dysfunction: Secondary | ICD-10-CM | POA: Diagnosis not present

## 2017-02-06 DIAGNOSIS — I483 Typical atrial flutter: Secondary | ICD-10-CM | POA: Diagnosis not present

## 2017-02-06 DIAGNOSIS — R002 Palpitations: Secondary | ICD-10-CM | POA: Diagnosis not present

## 2017-02-06 DIAGNOSIS — I471 Supraventricular tachycardia: Secondary | ICD-10-CM | POA: Diagnosis not present

## 2017-06-11 ENCOUNTER — Ambulatory Visit (INDEPENDENT_AMBULATORY_CARE_PROVIDER_SITE_OTHER): Payer: Medicare Other | Admitting: Cardiology

## 2017-06-11 ENCOUNTER — Encounter: Payer: Self-pay | Admitting: Cardiology

## 2017-06-11 VITALS — BP 134/72 | HR 60 | Resp 10 | Ht 70.0 in | Wt 191.8 lb

## 2017-06-11 DIAGNOSIS — E785 Hyperlipidemia, unspecified: Secondary | ICD-10-CM | POA: Insufficient documentation

## 2017-06-11 DIAGNOSIS — I483 Typical atrial flutter: Secondary | ICD-10-CM | POA: Diagnosis not present

## 2017-06-11 DIAGNOSIS — I471 Supraventricular tachycardia: Secondary | ICD-10-CM | POA: Diagnosis not present

## 2017-06-11 DIAGNOSIS — R002 Palpitations: Secondary | ICD-10-CM

## 2017-06-11 HISTORY — DX: Hyperlipidemia, unspecified: E78.5

## 2017-06-11 NOTE — Progress Notes (Signed)
Cardiology Office Note:    Date:  06/11/2017   ID:  Alexander Bowen, DOB 1951/09/30, MRN [id number]  PCP:  Maryella Shivers, MD  Cardiologist:  Jenne Campus, MD    Referring MD: Maryella Shivers, MD   Chief Complaint  Patient presents with  . Follow-up  Palpitations  History of Present Illness:    Alexander Bowen is a 66 y.o. male  report having rare palpitations, no sustained arrhythmia. Otherwise cardiac-wise doing well denies having any chest pain, tightness, squeezing, pressure, burning in the chest. He is very active he does have a placed on [place] when he walks a lot of alert, he cut grass and work in the garden have no difficulty doing this.  Past Medical History:  Diagnosis Date  . Dysrhythmia 2001   palpitations -checked out by Cardiologist  . GERD (gastroesophageal reflux disease)   . PONV (postoperative nausea and vomiting)     Past Surgical History:  Procedure Laterality Date  . APPENDECTOMY    . LITHOTRIPSY     x 2  . ORIF PATELLA Left 08/05/2015   Procedure: OPEN REDUCTION INTERNAL (ORIF) LEFT  FIXATION PATELLA;  Surgeon: Susa Day, MD;  Location: [facility];  Service: Orthopedics;  Laterality: Left;  . STONE EXTRACTION WITH BASKET     x2  . TONSILLECTOMY      Current Medications: Current Meds  Medication Sig  . aspirin EC 325 MG tablet Take 325 mg by mouth daily.  . metoprolol tartrate (LOPRESSOR) 25 MG tablet Take 1.5 tablets by mouth 2 (two) times daily.  . simvastatin (ZOCOR) 40 MG tablet Take 40 mg by mouth at bedtime.     Allergies:   Patient has no known allergies.   Social History   Social History  . Marital status: Married    Spouse name: N/A  . Number of children: N/A  . Years of education: N/A   Social History Main Topics  . Smoking status: Former Smoker    Packs/day: 1.50    Years: 20.00    Types: Cigarettes    Quit date: 03/23/1989  . Smokeless tobacco: Never Used  . Alcohol use Yes     Comment: occassionally  .  Drug use: No  . Sexual activity: Not Asked   Other Topics Concern  . None   Social History Narrative  . None     Family History: The patient's family history includes Diabetes in his father; Heart disease in his mother; Heart failure in his mother. ROS:   Please see the history of present illness.    All 14 point review of systems negative except as described per history of present illness  EKGs/Labs/Other Studies Reviewed:      Recent Labs: No results found for requested labs within last 8760 hours.  Recent Lipid Panel No results found for: CHOL, TRIG, HDL, CHOLHDL, VLDL, LDLCALC, LDLDIRECT  Physical Exam:    VS:  BP 134/72   Pulse 60   Resp 10   Ht 5\' 10"  (1.778 m)   Wt 191 lb 12.8 oz (87 kg)   BMI 27.52 kg/m     Wt Readings from Last 3 Encounters:  06/11/17 191 lb 12.8 oz (87 kg)  08/05/15 185 lb 3 oz (84 kg)  08/04/15 184 lb 9.6 oz (83.7 kg)     GEN:  Well nourished, well developed in no acute distress HEENT: Normal NECK: No JVD; No carotid bruits LYMPHATICS: No lymphadenopathy CARDIAC: RRR, no murmurs, no rubs, no gallops  RESPIRATORY:  Clear to auscultation without rales, wheezing or rhonchi  ABDOMEN: Soft, non-tender, non-distended MUSCULOSKELETAL:  No edema; No deformity  SKIN: Warm and dry LOWER EXTREMITIES: no swelling NEUROLOGIC:  Alert and oriented x 3 PSYCHIATRIC:  Normal affect   ASSESSMENT:    1. Typical atrial flutter ([facility])   2. Supraventricular tachycardia (HCC)   3. Palpitations   4. Dyslipidemia    PLAN:    In order of problems listed above:  1. Typical atrial flutter: Denies having any episodes. Chads score is 0, child fast is only 1. There are episodes, continue with aspirin. 2. Supraventricular tachycardia: Denies having any. 3. Dyslipidemia: We'll call primary care physician Report.   Medication Adjustments/Labs and Tests Ordered: Current medicines are reviewed at length with the patient today.  Concerns regarding medicines  are outlined above.  No orders of the defined types were placed in this encounter.  Medication changes: No orders of the defined types were placed in this encounter.   Signed, Park Liter, MD, [facility] 06/11/2017 11:30 AM    [place]

## 2017-06-11 NOTE — Patient Instructions (Signed)

## 2017-06-17 ENCOUNTER — Telehealth: Payer: Self-pay

## 2017-06-17 MED ORDER — METOPROLOL TARTRATE 25 MG PO TABS: 37.5000 mg | ORAL_TABLET | Freq: Two times a day (BID) | ORAL | 2 refills | Status: DC

## 2017-06-17 NOTE — Telephone Encounter (Signed)
Patient contacted office for refill on Metoprolol. Refill sent to [facility] as requested.

## 2017-08-01 DIAGNOSIS — Z125 Encounter for screening for malignant neoplasm of prostate: Secondary | ICD-10-CM | POA: Diagnosis not present

## 2017-08-01 DIAGNOSIS — E782 Mixed hyperlipidemia: Secondary | ICD-10-CM | POA: Diagnosis not present

## 2017-08-12 DIAGNOSIS — I471 Supraventricular tachycardia: Secondary | ICD-10-CM | POA: Diagnosis not present

## 2017-08-12 DIAGNOSIS — Z23 Encounter for immunization: Secondary | ICD-10-CM | POA: Diagnosis not present

## 2017-08-12 DIAGNOSIS — Z6828 Body mass index (BMI) 28.0-28.9, adult: Secondary | ICD-10-CM | POA: Diagnosis not present

## 2017-08-12 DIAGNOSIS — E782 Mixed hyperlipidemia: Secondary | ICD-10-CM | POA: Diagnosis not present

## 2017-08-30 DIAGNOSIS — H43393 Other vitreous opacities, bilateral: Secondary | ICD-10-CM | POA: Diagnosis not present

## 2017-08-30 DIAGNOSIS — H11153 Pinguecula, bilateral: Secondary | ICD-10-CM | POA: Diagnosis not present

## 2017-08-30 DIAGNOSIS — H25813 Combined forms of age-related cataract, bilateral: Secondary | ICD-10-CM | POA: Diagnosis not present

## 2017-09-27 DIAGNOSIS — Z1211 Encounter for screening for malignant neoplasm of colon: Secondary | ICD-10-CM | POA: Diagnosis not present

## 2017-09-27 DIAGNOSIS — K219 Gastro-esophageal reflux disease without esophagitis: Secondary | ICD-10-CM | POA: Diagnosis not present

## 2017-09-27 DIAGNOSIS — K625 Hemorrhage of anus and rectum: Secondary | ICD-10-CM | POA: Diagnosis not present

## 2017-10-12 DIAGNOSIS — R040 Epistaxis: Secondary | ICD-10-CM | POA: Diagnosis not present

## 2017-10-15 DIAGNOSIS — R040 Epistaxis: Secondary | ICD-10-CM | POA: Diagnosis not present

## 2017-10-15 DIAGNOSIS — I10 Essential (primary) hypertension: Secondary | ICD-10-CM | POA: Diagnosis not present

## 2017-10-15 DIAGNOSIS — Z7901 Long term (current) use of anticoagulants: Secondary | ICD-10-CM | POA: Diagnosis not present

## 2017-10-23 DIAGNOSIS — L820 Inflamed seborrheic keratosis: Secondary | ICD-10-CM | POA: Diagnosis not present

## 2017-10-23 DIAGNOSIS — D229 Melanocytic nevi, unspecified: Secondary | ICD-10-CM | POA: Diagnosis not present

## 2017-10-23 DIAGNOSIS — L821 Other seborrheic keratosis: Secondary | ICD-10-CM | POA: Diagnosis not present

## 2017-10-23 DIAGNOSIS — D485 Neoplasm of uncertain behavior of skin: Secondary | ICD-10-CM | POA: Diagnosis not present

## 2017-10-23 DIAGNOSIS — L570 Actinic keratosis: Secondary | ICD-10-CM | POA: Diagnosis not present

## 2017-10-25 DIAGNOSIS — I10 Essential (primary) hypertension: Secondary | ICD-10-CM | POA: Diagnosis not present

## 2017-10-25 DIAGNOSIS — R040 Epistaxis: Secondary | ICD-10-CM | POA: Diagnosis not present

## 2017-10-28 DIAGNOSIS — I10 Essential (primary) hypertension: Secondary | ICD-10-CM | POA: Diagnosis not present

## 2017-10-28 DIAGNOSIS — L905 Scar conditions and fibrosis of skin: Secondary | ICD-10-CM | POA: Diagnosis not present

## 2017-11-01 DIAGNOSIS — L905 Scar conditions and fibrosis of skin: Secondary | ICD-10-CM | POA: Diagnosis not present

## 2017-11-01 DIAGNOSIS — D8989 Other specified disorders involving the immune mechanism, not elsewhere classified: Secondary | ICD-10-CM | POA: Diagnosis not present

## 2017-11-07 DIAGNOSIS — L905 Scar conditions and fibrosis of skin: Secondary | ICD-10-CM | POA: Diagnosis not present

## 2017-11-07 DIAGNOSIS — I10 Essential (primary) hypertension: Secondary | ICD-10-CM | POA: Diagnosis not present

## 2017-11-25 DIAGNOSIS — R040 Epistaxis: Secondary | ICD-10-CM | POA: Diagnosis not present

## 2017-11-25 DIAGNOSIS — I10 Essential (primary) hypertension: Secondary | ICD-10-CM | POA: Diagnosis not present

## 2017-12-11 DIAGNOSIS — E785 Hyperlipidemia, unspecified: Secondary | ICD-10-CM | POA: Diagnosis not present

## 2017-12-11 DIAGNOSIS — Z7982 Long term (current) use of aspirin: Secondary | ICD-10-CM | POA: Diagnosis not present

## 2017-12-11 DIAGNOSIS — K649 Unspecified hemorrhoids: Secondary | ICD-10-CM | POA: Diagnosis not present

## 2017-12-11 DIAGNOSIS — K5730 Diverticulosis of large intestine without perforation or abscess without bleeding: Secondary | ICD-10-CM | POA: Diagnosis not present

## 2017-12-11 DIAGNOSIS — Z79899 Other long term (current) drug therapy: Secondary | ICD-10-CM | POA: Diagnosis not present

## 2017-12-11 DIAGNOSIS — K648 Other hemorrhoids: Secondary | ICD-10-CM | POA: Diagnosis not present

## 2017-12-11 DIAGNOSIS — Z1211 Encounter for screening for malignant neoplasm of colon: Secondary | ICD-10-CM | POA: Diagnosis not present

## 2017-12-11 DIAGNOSIS — K219 Gastro-esophageal reflux disease without esophagitis: Secondary | ICD-10-CM | POA: Diagnosis not present

## 2017-12-26 DIAGNOSIS — R040 Epistaxis: Secondary | ICD-10-CM | POA: Diagnosis not present

## 2018-01-07 ENCOUNTER — Encounter: Payer: Self-pay | Admitting: Cardiology

## 2018-01-07 ENCOUNTER — Ambulatory Visit (INDEPENDENT_AMBULATORY_CARE_PROVIDER_SITE_OTHER): Payer: Medicare Other | Admitting: Cardiology

## 2018-01-07 VITALS — BP 134/74 | HR 57 | Ht 70.0 in | Wt 187.1 lb

## 2018-01-07 DIAGNOSIS — I471 Supraventricular tachycardia, unspecified: Secondary | ICD-10-CM

## 2018-01-07 DIAGNOSIS — E785 Hyperlipidemia, unspecified: Secondary | ICD-10-CM | POA: Diagnosis not present

## 2018-01-07 DIAGNOSIS — I483 Typical atrial flutter: Secondary | ICD-10-CM

## 2018-01-07 DIAGNOSIS — R002 Palpitations: Secondary | ICD-10-CM | POA: Diagnosis not present

## 2018-01-07 NOTE — Progress Notes (Signed)
Cardiology Office Note:    Date:  01/07/2018   ID:  Alexander Bowen, DOB 09/29/1951, MRN [id number]  PCP:  Maryella Shivers, MD  Cardiologist:  Jenne Campus, MD    Referring MD: Maryella Shivers, MD   Chief Complaint  Patient presents with  . Follow-up  I am doing well  History of Present Illness:    Alexander Bowen is a 67 y.o. male with palpitations atrial flutter.  Also history of SVT.  He is doing very well.  Denies have any chest pain tightness squeezing pressure burning chest.  Very rare palpitation lasting only for a few seconds no sustained arrhythmias.  Apparently when he was taking aspirin he developed significant nosebleed got scared and stop aspirin.  His chads 2 Vascor equals 1.  And I told him that we get no hard data telling us that aspirin is helpful in this situation but I will still recommend at least baby aspirin for now.  If he exercise very aggressively he goes to gym on the regular basis 4-5 times a week and have no difficulty doing exercises.  Past Medical History:  Diagnosis Date  . Dysrhythmia 2001   palpitations -checked out by Cardiologist  . GERD (gastroesophageal reflux disease)   . PONV (postoperative nausea and vomiting)     Past Surgical History:  Procedure Laterality Date  . APPENDECTOMY    . LITHOTRIPSY     x 2  . ORIF PATELLA Left 08/05/2015   Procedure: OPEN REDUCTION INTERNAL (ORIF) LEFT  FIXATION PATELLA;  Surgeon: Susa Day, MD;  Location: [facility];  Service: Orthopedics;  Laterality: Left;  . STONE EXTRACTION WITH BASKET     x2  . TONSILLECTOMY      Current Medications: Current Meds  Medication Sig  . metoprolol tartrate (LOPRESSOR) 25 MG tablet Take 1.5 tablets (37.5 mg total) by mouth 2 (two) times daily.  . simvastatin (ZOCOR) 40 MG tablet Take 40 mg by mouth at bedtime.     Allergies:   Patient has no known allergies.   Social History   Socioeconomic History  . Marital status: Married    Spouse name: None  .  Number of children: None  . Years of education: None  . Highest education level: None  Social Needs  . Financial resource strain: None  . Food insecurity - worry: None  . Food insecurity - inability: None  . Transportation needs - medical: None  . Transportation needs - non-medical: None  Occupational History  . None  Tobacco Use  . Smoking status: Former Smoker    Packs/day: 1.50    Years: 20.00    Pack years: 30.00    Types: Cigarettes    Last attempt to quit: 03/23/1989    Years since quitting: 28.8  . Smokeless tobacco: Never Used  Substance and Sexual Activity  . Alcohol use: Yes    Comment: occassionally  . Drug use: No  . Sexual activity: None  Other Topics Concern  . None  Social History Narrative  . None     Family History: The patient's family history includes Diabetes in his father; Heart disease in his mother; Heart failure in his mother. ROS:   Please see the history of present illness.    All 14 point review of systems negative except as described per history of present illness  EKGs/Labs/Other Studies Reviewed:      Recent Labs: No results found for requested labs within last 8760 hours.  Recent Lipid Panel No  results found for: CHOL, TRIG, HDL, CHOLHDL, VLDL, LDLCALC, LDLDIRECT  Physical Exam:    VS:  BP 134/74   Pulse (!) 57   Ht 5\' 10"  (1.778 m)   Wt 187 lb 1.9 oz (84.9 kg)   SpO2 98%   BMI 26.85 kg/m     Wt Readings from Last 3 Encounters:  01/07/18 187 lb 1.9 oz (84.9 kg)  06/11/17 191 lb 12.8 oz (87 kg)  08/05/15 185 lb 3 oz (84 kg)     GEN:  Well nourished, well developed in no acute distress HEENT: Normal NECK: No JVD; No carotid bruits LYMPHATICS: No lymphadenopathy CARDIAC: RRR, no murmurs, no rubs, no gallops RESPIRATORY:  Clear to auscultation without rales, wheezing or rhonchi  ABDOMEN: Soft, non-tender, non-distended MUSCULOSKELETAL:  No edema; No deformity  SKIN: Warm and dry LOWER EXTREMITIES: no  swelling NEUROLOGIC:  Alert and oriented x 3 PSYCHIATRIC:  Normal affect   ASSESSMENT:    1. Typical atrial flutter ([place])   2. Supraventricular tachycardia ([place])   3. Dyslipidemia   4. Palpitations    PLAN:    In order of problems listed above:  1. Typical atrial flutter: Denies having any sustained arrhythmia.  Continue present medications. 2. Supraventricular tachycardia: Very rare episode of short lasting palpitations. 3. Dyslipidemia: We will call primary care physician to get his fasting lipid profile 4. Palpitations: Rare continue conservative approach   Medication Adjustments/Labs and Tests Ordered: Current medicines are reviewed at length with the patient today.  Concerns regarding medicines are outlined above.  No orders of the defined types were placed in this encounter.  Medication changes: No orders of the defined types were placed in this encounter.   Signed, Park Liter, MD, [facility] 01/07/2018 10:49 AM    [place]

## 2018-01-07 NOTE — Patient Instructions (Signed)
Medication Instructions:  Your physician recommends that you continue on your current medications as directed. Please refer to the Current Medication list given to you today.  Labwork: None    Testing/Procedures: None   Follow-Up: Your physician wants you to follow-up in: 6 months. You will receive a reminder letter in the mail two months in advance. If you don't receive a letter, please call our office to schedule the follow-up appointment.  Any Other Special Instructions Will Be Listed Below (If Applicable).  Please note that any paperwork needing to be filled out by the provider will need to be addressed at the front desk prior to seeing the provider. Please note that any paperwork FMLA, Disability or other documents regarding health condition is subject to a $25.00 charge that must be received prior to completion of paperwork in the form of a money order or check.    If you need a refill on your cardiac medications before your next appointment, please call your pharmacy.  

## 2018-01-16 DIAGNOSIS — J342 Deviated nasal septum: Secondary | ICD-10-CM | POA: Diagnosis not present

## 2018-01-16 DIAGNOSIS — J310 Chronic rhinitis: Secondary | ICD-10-CM | POA: Diagnosis not present

## 2018-01-16 DIAGNOSIS — Z87898 Personal history of other specified conditions: Secondary | ICD-10-CM | POA: Diagnosis not present

## 2018-02-11 DIAGNOSIS — E782 Mixed hyperlipidemia: Secondary | ICD-10-CM | POA: Diagnosis not present

## 2018-02-17 DIAGNOSIS — Z1331 Encounter for screening for depression: Secondary | ICD-10-CM | POA: Diagnosis not present

## 2018-02-17 DIAGNOSIS — E782 Mixed hyperlipidemia: Secondary | ICD-10-CM | POA: Diagnosis not present

## 2018-02-17 DIAGNOSIS — Z9181 History of falling: Secondary | ICD-10-CM | POA: Diagnosis not present

## 2018-02-17 DIAGNOSIS — Z6827 Body mass index (BMI) 27.0-27.9, adult: Secondary | ICD-10-CM | POA: Diagnosis not present

## 2018-02-17 DIAGNOSIS — Z0000 Encounter for general adult medical examination without abnormal findings: Secondary | ICD-10-CM | POA: Diagnosis not present

## 2018-02-17 DIAGNOSIS — I471 Supraventricular tachycardia: Secondary | ICD-10-CM | POA: Diagnosis not present

## 2018-02-17 DIAGNOSIS — Z139 Encounter for screening, unspecified: Secondary | ICD-10-CM | POA: Diagnosis not present

## 2018-03-26 ENCOUNTER — Other Ambulatory Visit: Payer: Self-pay | Admitting: *Deleted

## 2018-03-26 MED ORDER — METOPROLOL TARTRATE 25 MG PO TABS: 37.5000 mg | ORAL_TABLET | Freq: Two times a day (BID) | ORAL | 2 refills | Status: DC

## 2018-03-26 NOTE — Telephone Encounter (Signed)
Refill for metoprolol tartrate sent to [facility] in [place].

## 2018-04-30 ENCOUNTER — Ambulatory Visit: Payer: Medicare Other | Admitting: Cardiology

## 2018-05-16 ENCOUNTER — Other Ambulatory Visit: Payer: Self-pay

## 2018-05-16 ENCOUNTER — Telehealth: Payer: Self-pay | Admitting: Cardiology

## 2018-05-16 DIAGNOSIS — I483 Typical atrial flutter: Secondary | ICD-10-CM

## 2018-05-16 NOTE — Telephone Encounter (Signed)
Left word with wife for the patient to call the office back.

## 2018-05-16 NOTE — Telephone Encounter (Signed)
Patient states that he has been having palpitations on and off for 30 days. Pulse has not been rapid only lowering to 49 bpm. Patient states that he becomes slightly short of breath when the palpitations occurs. Per Dr. Agustin Cree the patient is to wear 30 day monitor; message has been sent to precert and adrienne for scheduling.

## 2018-05-16 NOTE — Telephone Encounter (Signed)
Having some atrial flutter

## 2018-05-19 NOTE — Telephone Encounter (Signed)
Scheduled for 07/19.

## 2018-05-30 ENCOUNTER — Ambulatory Visit (INDEPENDENT_AMBULATORY_CARE_PROVIDER_SITE_OTHER): Payer: Medicare Other

## 2018-05-30 DIAGNOSIS — I483 Typical atrial flutter: Secondary | ICD-10-CM

## 2018-06-17 DIAGNOSIS — R079 Chest pain, unspecified: Secondary | ICD-10-CM | POA: Diagnosis not present

## 2018-06-17 DIAGNOSIS — I4891 Unspecified atrial fibrillation: Secondary | ICD-10-CM | POA: Diagnosis not present

## 2018-06-17 DIAGNOSIS — I471 Supraventricular tachycardia: Secondary | ICD-10-CM | POA: Diagnosis not present

## 2018-06-17 DIAGNOSIS — Z139 Encounter for screening, unspecified: Secondary | ICD-10-CM | POA: Diagnosis not present

## 2018-06-21 ENCOUNTER — Encounter: Payer: Self-pay | Admitting: Physician Assistant

## 2018-06-21 DIAGNOSIS — I158 Other secondary hypertension: Secondary | ICD-10-CM | POA: Diagnosis not present

## 2018-06-21 DIAGNOSIS — R0789 Other chest pain: Secondary | ICD-10-CM | POA: Diagnosis not present

## 2018-06-21 DIAGNOSIS — I10 Essential (primary) hypertension: Secondary | ICD-10-CM | POA: Diagnosis not present

## 2018-06-21 DIAGNOSIS — R0602 Shortness of breath: Secondary | ICD-10-CM | POA: Diagnosis not present

## 2018-06-21 NOTE — Progress Notes (Signed)
Received a request from Dr. Radford Pax to arrange early outpatient follow-up for this patient.  She had spoken with urgent care.  I sent a message to scheduling to arrange early outpatient follow-up this week and to call patient with appointment.

## 2018-06-30 ENCOUNTER — Encounter: Payer: Self-pay | Admitting: Cardiology

## 2018-06-30 ENCOUNTER — Ambulatory Visit (INDEPENDENT_AMBULATORY_CARE_PROVIDER_SITE_OTHER): Payer: Medicare Other | Admitting: Cardiology

## 2018-06-30 VITALS — BP 120/72 | HR 76 | Resp 10 | Ht 70.0 in | Wt 187.0 lb

## 2018-06-30 DIAGNOSIS — I471 Supraventricular tachycardia: Secondary | ICD-10-CM | POA: Diagnosis not present

## 2018-06-30 DIAGNOSIS — I483 Typical atrial flutter: Secondary | ICD-10-CM

## 2018-06-30 DIAGNOSIS — R002 Palpitations: Secondary | ICD-10-CM | POA: Diagnosis not present

## 2018-06-30 DIAGNOSIS — R0789 Other chest pain: Secondary | ICD-10-CM | POA: Insufficient documentation

## 2018-06-30 HISTORY — DX: Other chest pain: R07.89

## 2018-06-30 LAB — BASIC METABOLIC PANEL
BUN/Creatinine Ratio: 13 (ref 10–24)
BUN: 15 mg/dL (ref 8–27)
CO2: 23 mmol/L (ref 20–29)
Calcium: 9.5 mg/dL (ref 8.6–10.2)
Chloride: 100 mmol/L (ref 96–106)
Creatinine, Ser: 1.17 mg/dL (ref 0.76–1.27)
GFR calc Af Amer: 74 mL/min/{1.73_m2} (ref >59)
GFR calc non Af Amer: 64 mL/min/{1.73_m2} (ref >59)
Glucose: 105 mg/dL — ABNORMAL HIGH (ref 65–99)
Potassium: 5 mmol/L (ref 3.5–5.2)
Sodium: 140 mmol/L (ref 134–144)

## 2018-06-30 NOTE — Patient Instructions (Addendum)
Medication Instructions:  Your physician recommends that you continue on your current medications as directed. Please refer to the Current Medication list given to you today.  Labwork: Your physician recommends that you have the following labs drawn: BMP today  Testing/Procedures: Your physician has requested that you have a stress echocardiogram. For further information please visit [email or web address]. Please follow instruction sheet as given.  Follow-Up: Your physician recommends that you schedule a follow-up appointment in: 1 month  Any Other Special Instructions Will Be Listed Below (If Applicable).     If you need a refill on your cardiac medications before your next appointment, please call your pharmacy.   [place], RN, BSN   Cardiopulmonary Exercise Stress Test Cardiopulmonary exercise testing (CPET) is a test that checks how your heart and lungs react to exercise. This is called your exercise capacity. During this test, you will walk or run on a treadmill or pedal on a stationary bike while tests are done on your heart and lungs. You may have this test to:  See why you are short of breath.  Check for exercise intolerance.  See how your lungs work.  See how your heart works.  Check for how you are responding to a heart or lung rehabilitation program.  See if you have a heart or lung problem.  See if you are healthy enough to have surgery.  What happens before the procedure?  Follow instructions from your doctor about what you cannot eat or drink.  Ask your doctor about changing or stopping your normal medicines. This is important if you take diabetes medicines or blood thinners.  Wear loose, comfortable clothing and shoes.  If you use an inhaler, bring it with you to the test. What happens during the procedure?  A blood pressure cuff will be placed on your arm.  Several stick-on patches (electrodes) will be placed on your chest. They  will be attached to an electrocardiogram (EKG) machine.  A clip-on monitor that measures the amount of oxygen in your blood will be placed on your finger (pulse oximeter).  A clip will be placed on your nose and a mouthpiece will be placed in your mouth. This may be held in place with a headpiece. You will breathe through the mouthpiece during the test.  You will be asked to start exercising. You will be closely watched while you exercise.  The amount of effort for your exercise will be gradually increased.  During exercise, the test will measure: ? Your heart rate. ? Your heart rhythm. ? Your oxygen blood level. ? The amount of oxygen and carbon dioxide that you breathe out.  The test will end when: ? You have finished the test. ? You have reached your maximum ability to exercise. ? You have chest or leg pain, dizziness, or shortness of breath. The procedure may vary among doctors and hospitals. What happens after the procedure?  Your blood pressure and EKG will be checked to watch how you recover from the test. This information is not intended to replace advice given to you by your health care provider. Make sure you discuss any questions you have with your health care provider. Document Released: 10/17/2009 Document Revised: 03/20/2016 Document Reviewed: 09/12/2015 Elsevier Interactive Patient Education  2018 [employer].

## 2018-06-30 NOTE — Addendum Note (Signed)
Addended by: Mattie Marlin on: 06/30/2018 09:01 AM   Modules accepted: Orders

## 2018-06-30 NOTE — Progress Notes (Signed)
Cardiology Office Note:    Date:  06/30/2018   ID:  Alexander Bowen, DOB Nov 29, 1950, MRN [id number]  PCP:  Maryella Shivers, MD  Cardiologist:  Jenne Campus, MD    Referring MD: Maryella Shivers, MD   Chief Complaint  Patient presents with  . Follow-up  . Chest Pain  . Palpitations  . Hypertension  I have chest pain and palpitations  History of Present Illness:    Alexander Bowen is a 67 y.o. male with supraventricular arrhythmia.,  Also complaining of having some atypical chest pain that happened with no exercise lasting for couple minutes like a fullness sensation in the middle of the chest he also does have a problem with his gallbladder as well as hiatal hernia.  He goes to gym on the regular basis in the abdomen to exercise with no symptoms however very worried and concerned about his chest tightness that he had [facility] EKG was done all were normal he was sent to [place].  Past Medical History:  Diagnosis Date  . Dysrhythmia 2001   palpitations -checked out by Cardiologist  . GERD (gastroesophageal reflux disease)   . PONV (postoperative nausea and vomiting)     Past Surgical History:  Procedure Laterality Date  . APPENDECTOMY    . LITHOTRIPSY     x 2  . ORIF PATELLA Left 08/05/2015   Procedure: OPEN REDUCTION INTERNAL (ORIF) LEFT  FIXATION PATELLA;  Surgeon: Susa Day, MD;  Location: [facility];  Service: Orthopedics;  Laterality: Left;  . STONE EXTRACTION WITH BASKET     x2  . TONSILLECTOMY      Current Medications: Current Meds  Medication Sig  . aspirin EC 325 MG tablet Take 325 mg by mouth daily.  Marland Kitchen lisinopril (PRINIVIL,ZESTRIL) 5 MG tablet Take 1 tablet by mouth daily.  . metoprolol tartrate (LOPRESSOR) 25 MG tablet Take 1.5 tablets (37.5 mg total) by mouth 2 (two) times daily. (Patient taking differently: Take 50 mg by mouth 2 (two) times daily. )  . simvastatin (ZOCOR) 40 MG tablet Take 40 mg by mouth at  bedtime.     Allergies:   Patient has no known allergies.   Social History   Socioeconomic History  . Marital status: Married    Spouse name: Not on file  . Number of children: Not on file  . Years of education: Not on file  . Highest education level: Not on file  Occupational History  . Not on file  Social Needs  . Financial resource strain: Not on file  . Food insecurity:    Worry: Not on file    Inability: Not on file  . Transportation needs:    Medical: Not on file    Non-medical: Not on file  Tobacco Use  . Smoking status: Former Smoker    Packs/day: 1.50    Years: 20.00    Pack years: 30.00    Types: Cigarettes    Last attempt to quit: 03/23/1989    Years since quitting: 29.2  . Smokeless tobacco: Never Used  Substance and Sexual Activity  . Alcohol use: Yes    Comment: occassionally  . Drug use: No  . Sexual activity: Not on file  Lifestyle  . Physical activity:    Days per week: Not on file    Minutes per session: Not on file  . Stress: Not on file  Relationships  . Social connections:    Talks on  phone: Not on file    Gets together: Not on file    Attends religious service: Not on file    Active member of club or organization: Not on file    Attends meetings of clubs or organizations: Not on file    Relationship status: Not on file  Other Topics Concern  . Not on file  Social History Narrative  . Not on file     Family History: The patient's family history includes Diabetes in his father; Heart disease in his mother; Heart failure in his mother. ROS:   Please see the history of present illness.    All 14 point review of systems negative except as described per history of present illness  EKGs/Labs/Other Studies Reviewed:      Recent Labs: No results found for requested labs within last 8760 hours.  Recent Lipid Panel No results found for: CHOL, TRIG, HDL, CHOLHDL, VLDL, LDLCALC, LDLDIRECT  Physical Exam:    VS:  BP 120/72   Pulse 76    Resp 10   Ht 5\' 10"  (1.778 m)   Wt 187 lb (84.8 kg)   BMI 26.83 kg/m     Wt Readings from Last 3 Encounters:  06/30/18 187 lb (84.8 kg)  01/07/18 187 lb 1.9 oz (84.9 kg)  06/11/17 191 lb 12.8 oz (87 kg)     GEN:  Well nourished, well developed in no acute distress HEENT: Normal NECK: No JVD; No carotid bruits LYMPHATICS: No lymphadenopathy CARDIAC: RRR, no murmurs, no rubs, no gallops RESPIRATORY:  Clear to auscultation without rales, wheezing or rhonchi  ABDOMEN: Soft, non-tender, non-distended MUSCULOSKELETAL:  No edema; No deformity  SKIN: Warm and dry LOWER EXTREMITIES: no swelling NEUROLOGIC:  Alert and oriented x 3 PSYCHIATRIC:  Normal affect   ASSESSMENT:    1. Palpitations   2. Atypical chest pain   3. Supraventricular tachycardia ([place])   4. Typical atrial flutter (H[facility])    PLAN:    In order of problems listed above:  1. Palpitations he just finished wearing event recorder in the matter-of-fact he returns to [place] today via mail.  Will wait for results of it.  [facility] dose of metoprolol to 50 twice daily and he is feeling better with it however noted his heart rate being slow also slow while exercising. 2. Atypical chest pain denies having any.  I will ask him to have a stress test we will do stress echocardiogram.  I want him to take beta-blocker the day of the test since I would like to see we will see chronotropic response will be. 3. Supraventricular tachycardia that seems to be controlled with some limited success with beta-blocker however beta-blocker has been recently increased.  Will wait for results of his event recorder. 4. Typical atrial flutter his chads 2 Vascor equals 1 he is not anticoagulated yet but will wait for results of his event recorder to decide about therapy.   Medication Adjustments/Labs and Tests Ordered: Current medicines are reviewed at length with the patient today.  Concerns regarding medicines are  outlined above.  No orders of the defined types were placed in this encounter.  Medication changes: No orders of the defined types were placed in this encounter.   Signed, Park Liter, MD, [facility] 06/30/2018 8:50 AM    [place]

## 2018-07-02 ENCOUNTER — Ambulatory Visit (INDEPENDENT_AMBULATORY_CARE_PROVIDER_SITE_OTHER): Payer: Medicare Other

## 2018-07-02 DIAGNOSIS — R002 Palpitations: Secondary | ICD-10-CM

## 2018-07-02 DIAGNOSIS — R0789 Other chest pain: Secondary | ICD-10-CM

## 2018-07-02 NOTE — Progress Notes (Signed)
Stress echocardiogram with limited exam has been performed.  [place]

## 2018-07-07 ENCOUNTER — Ambulatory Visit: Payer: Medicare Other | Admitting: Cardiology

## 2018-08-05 ENCOUNTER — Ambulatory Visit (INDEPENDENT_AMBULATORY_CARE_PROVIDER_SITE_OTHER): Payer: Medicare Other | Admitting: Cardiology

## 2018-08-05 ENCOUNTER — Encounter: Payer: Self-pay | Admitting: Cardiology

## 2018-08-05 VITALS — BP 140/70 | HR 71 | Ht 70.0 in | Wt 189.4 lb

## 2018-08-05 DIAGNOSIS — E785 Hyperlipidemia, unspecified: Secondary | ICD-10-CM

## 2018-08-05 DIAGNOSIS — I471 Supraventricular tachycardia: Secondary | ICD-10-CM

## 2018-08-05 DIAGNOSIS — R002 Palpitations: Secondary | ICD-10-CM | POA: Diagnosis not present

## 2018-08-05 DIAGNOSIS — R0789 Other chest pain: Secondary | ICD-10-CM

## 2018-08-05 NOTE — Patient Instructions (Signed)
Medication Instructions:  Your physician recommends that you continue on your current medications as directed. Please refer to the Current Medication list given to you today.  Labwork: None  Testing/Procedures: None  Follow-Up: Your physician wants you to follow-up in: 5 months. You will receive a reminder letter in the mail two months in advance. If you don't receive a letter, please call our office to schedule the follow-up appointment.  Any Other Special Instructions Will Be Listed Below (If Applicable).     If you need a refill on your cardiac medications before your next appointment, please call your pharmacy.   

## 2018-08-05 NOTE — Progress Notes (Signed)
Cardiology Office Note:    Date:  08/05/2018   ID:  Alexander Bowen, DOB [birth year], MRN [id number]  PCP:  Maryella Shivers, MD  Cardiologist:  Jenne Campus, MD    Referring MD: Maryella Shivers, MD   No chief complaint on file. Doing well  History of Present Illness:    Alexander Bowen is a 67 y.o. male with palpitations as well as atypical chest pain.  He did have a stress test which showed no evidence of ischemia ventricular ectopy were shown some evidence of PVCs and APCs that were symptomatic he is on beta-blocker and seems to be doing well denies have any chest pain tightness squeezing pressure burning chest now.  Past Medical History:  Diagnosis Date  . Dysrhythmia 2001   palpitations -checked out by Cardiologist  . GERD (gastroesophageal reflux disease)   . PONV (postoperative nausea and vomiting)     Past Surgical History:  Procedure Laterality Date  . APPENDECTOMY    . LITHOTRIPSY     x 2  . ORIF PATELLA Left 08/05/2015   Procedure: OPEN REDUCTION INTERNAL (ORIF) LEFT  FIXATION PATELLA;  Surgeon: Susa Day, MD;  Location: [facility];  Service: Orthopedics;  Laterality: Left;  . STONE EXTRACTION WITH BASKET     x2  . TONSILLECTOMY      Current Medications: Current Meds  Medication Sig  . aspirin EC 325 MG tablet Take 325 mg by mouth daily.  . metoprolol tartrate (LOPRESSOR) 25 MG tablet Take 1.5 tablets (37.5 mg total) by mouth 2 (two) times daily. (Patient taking differently: Take 50 mg by mouth 2 (two) times daily. )  . simvastatin (ZOCOR) 40 MG tablet Take 40 mg by mouth at bedtime.     Allergies:   Patient has no known allergies.   Social History   Socioeconomic History  . Marital status: Married    Spouse name: Not on file  . Number of children: Not on file  . Years of education: Not on file  . Highest education level: Not on file  Occupational History  . Not on file  Social Needs  . Financial resource strain: Not on file  . Food  insecurity:    Worry: Not on file    Inability: Not on file  . Transportation needs:    Medical: Not on file    Non-medical: Not on file  Tobacco Use  . Smoking status: Former Smoker    Packs/day: 1.50    Years: 20.00    Pack years: 30.00    Types: Cigarettes    Last attempt to quit: 03/23/1989    Years since quitting: 29.3  . Smokeless tobacco: Never Used  Substance and Sexual Activity  . Alcohol use: Yes    Comment: occassionally  . Drug use: No  . Sexual activity: Not on file  Lifestyle  . Physical activity:    Days per week: Not on file    Minutes per session: Not on file  . Stress: Not on file  Relationships  . Social connections:    Talks on phone: Not on file    Gets together: Not on file    Attends religious service: Not on file    Active member of club or organization: Not on file    Attends meetings of clubs or organizations: Not on file    Relationship status: Not on file  Other Topics Concern  . Not on file  Social History Narrative  . Not on file  Family History: The patient's family history includes Diabetes in his father; Heart disease in his mother; Heart failure in his mother. ROS:   Please see the history of present illness.    All 14 point review of systems negative except as described per history of present illness  EKGs/Labs/Other Studies Reviewed:      Recent Labs: 06/30/2018: BUN 15; Creatinine, Ser 1.17; Potassium 5.0; Sodium 140  Recent Lipid Panel No results found for: CHOL, TRIG, HDL, CHOLHDL, VLDL, LDLCALC, LDLDIRECT  Physical Exam:    VS:  BP 140/70   Pulse 71   Ht 5\' 10"  (1.778 m)   Wt 189 lb 6.4 oz (85.9 kg)   SpO2 97%   BMI 27.18 kg/m     Wt Readings from Last 3 Encounters:  08/05/18 189 lb 6.4 oz (85.9 kg)  06/30/18 187 lb (84.8 kg)  01/07/18 187 lb 1.9 oz (84.9 kg)     GEN:  Well nourished, well developed in no acute distress HEENT: Normal NECK: No JVD; No carotid bruits LYMPHATICS: No  lymphadenopathy CARDIAC: RRR, no murmurs, no rubs, no gallops RESPIRATORY:  Clear to auscultation without rales, wheezing or rhonchi  ABDOMEN: Soft, non-tender, non-distended MUSCULOSKELETAL:  No edema; No deformity  SKIN: Warm and dry LOWER EXTREMITIES: no swelling NEUROLOGIC:  Alert and oriented x 3 PSYCHIATRIC:  Normal affect   ASSESSMENT:    1. Supraventricular tachycardia ([place])   2. Dyslipidemia   3. Palpitations   4. Atypical chest pain    PLAN:    In order of problems listed above:  1. Supraventricular tachycardia denies having any on beta-blocker which I will continue. 2. Dyslipidemia on statin which I will continue for now. 3. Palpitations very mild and do not bother him at all. 4. Atypical chest pain: Recent stress test negative.  We will continue present management.   Medication Adjustments/Labs and Tests Ordered: Current medicines are reviewed at length with the patient today.  Concerns regarding medicines are outlined above.  No orders of the defined types were placed in this encounter.  Medication changes: No orders of the defined types were placed in this encounter.   Signed, Park Liter, MD, [facility] [address] 08/05/2018 3:41 PM    [place]

## 2018-08-12 DIAGNOSIS — N433 Hydrocele, unspecified: Secondary | ICD-10-CM | POA: Diagnosis not present

## 2018-08-12 DIAGNOSIS — N3000 Acute cystitis without hematuria: Secondary | ICD-10-CM | POA: Diagnosis not present

## 2018-09-09 DIAGNOSIS — E782 Mixed hyperlipidemia: Secondary | ICD-10-CM | POA: Diagnosis not present

## 2018-09-11 DIAGNOSIS — N433 Hydrocele, unspecified: Secondary | ICD-10-CM | POA: Diagnosis not present

## 2018-09-11 DIAGNOSIS — N3020 Other chronic cystitis without hematuria: Secondary | ICD-10-CM | POA: Diagnosis not present

## 2018-09-15 DIAGNOSIS — R1013 Epigastric pain: Secondary | ICD-10-CM | POA: Diagnosis not present

## 2018-09-15 DIAGNOSIS — Z6827 Body mass index (BMI) 27.0-27.9, adult: Secondary | ICD-10-CM | POA: Diagnosis not present

## 2018-09-15 DIAGNOSIS — Z23 Encounter for immunization: Secondary | ICD-10-CM | POA: Diagnosis not present

## 2018-09-15 DIAGNOSIS — R002 Palpitations: Secondary | ICD-10-CM | POA: Diagnosis not present

## 2018-09-15 DIAGNOSIS — E782 Mixed hyperlipidemia: Secondary | ICD-10-CM | POA: Diagnosis not present

## 2018-10-13 DIAGNOSIS — N3020 Other chronic cystitis without hematuria: Secondary | ICD-10-CM | POA: Diagnosis not present

## 2018-10-13 DIAGNOSIS — N433 Hydrocele, unspecified: Secondary | ICD-10-CM | POA: Diagnosis not present

## 2018-10-13 DIAGNOSIS — N401 Enlarged prostate with lower urinary tract symptoms: Secondary | ICD-10-CM | POA: Diagnosis not present

## 2018-10-16 DIAGNOSIS — K219 Gastro-esophageal reflux disease without esophagitis: Secondary | ICD-10-CM | POA: Diagnosis not present

## 2018-10-16 DIAGNOSIS — N433 Hydrocele, unspecified: Secondary | ICD-10-CM | POA: Diagnosis not present

## 2018-10-16 DIAGNOSIS — Z01818 Encounter for other preprocedural examination: Secondary | ICD-10-CM | POA: Diagnosis not present

## 2018-10-16 DIAGNOSIS — N503 Cyst of epididymis: Secondary | ICD-10-CM | POA: Diagnosis not present

## 2018-10-16 DIAGNOSIS — Z87891 Personal history of nicotine dependence: Secondary | ICD-10-CM | POA: Diagnosis not present

## 2018-10-16 DIAGNOSIS — R001 Bradycardia, unspecified: Secondary | ICD-10-CM | POA: Diagnosis not present

## 2018-10-16 DIAGNOSIS — Z01810 Encounter for preprocedural cardiovascular examination: Secondary | ICD-10-CM | POA: Diagnosis not present

## 2018-10-16 DIAGNOSIS — Z7982 Long term (current) use of aspirin: Secondary | ICD-10-CM | POA: Diagnosis not present

## 2018-10-16 DIAGNOSIS — Z79899 Other long term (current) drug therapy: Secondary | ICD-10-CM | POA: Diagnosis not present

## 2018-10-16 DIAGNOSIS — I10 Essential (primary) hypertension: Secondary | ICD-10-CM | POA: Diagnosis not present

## 2018-10-16 DIAGNOSIS — E785 Hyperlipidemia, unspecified: Secondary | ICD-10-CM | POA: Diagnosis not present

## 2018-10-23 DIAGNOSIS — N433 Hydrocele, unspecified: Secondary | ICD-10-CM | POA: Diagnosis not present

## 2018-10-23 DIAGNOSIS — N3020 Other chronic cystitis without hematuria: Secondary | ICD-10-CM | POA: Diagnosis not present

## 2018-11-02 DIAGNOSIS — J0140 Acute pansinusitis, unspecified: Secondary | ICD-10-CM | POA: Diagnosis not present

## 2018-11-24 DIAGNOSIS — N433 Hydrocele, unspecified: Secondary | ICD-10-CM | POA: Diagnosis not present

## 2018-12-05 ENCOUNTER — Other Ambulatory Visit: Payer: Self-pay | Admitting: Cardiology

## 2019-01-05 DIAGNOSIS — R1011 Right upper quadrant pain: Secondary | ICD-10-CM | POA: Diagnosis not present

## 2019-01-12 DIAGNOSIS — R1013 Epigastric pain: Secondary | ICD-10-CM | POA: Diagnosis not present

## 2019-01-12 DIAGNOSIS — R1011 Right upper quadrant pain: Secondary | ICD-10-CM | POA: Diagnosis not present

## 2019-01-13 ENCOUNTER — Ambulatory Visit (INDEPENDENT_AMBULATORY_CARE_PROVIDER_SITE_OTHER): Payer: Medicare Other | Admitting: Cardiology

## 2019-01-13 ENCOUNTER — Encounter: Payer: Self-pay | Admitting: Cardiology

## 2019-01-13 VITALS — BP 150/62 | HR 64 | Ht 70.0 in | Wt 191.8 lb

## 2019-01-13 DIAGNOSIS — R002 Palpitations: Secondary | ICD-10-CM | POA: Diagnosis not present

## 2019-01-13 DIAGNOSIS — I471 Supraventricular tachycardia: Secondary | ICD-10-CM | POA: Diagnosis not present

## 2019-01-13 DIAGNOSIS — R0789 Other chest pain: Secondary | ICD-10-CM

## 2019-01-13 DIAGNOSIS — E785 Hyperlipidemia, unspecified: Secondary | ICD-10-CM | POA: Diagnosis not present

## 2019-01-13 MED ORDER — ASPIRIN EC 81 MG PO TBEC: 81.0000 mg | DELAYED_RELEASE_TABLET | Freq: Every day | ORAL | 3 refills | Status: DC

## 2019-01-13 MED ORDER — METOPROLOL TARTRATE 50 MG PO TABS: ORAL_TABLET | ORAL | 3 refills | Status: DC

## 2019-01-13 NOTE — Patient Instructions (Addendum)
Medication Instructions:  START taking 50 mg of metoprolol (1 tablet) twice daily  STOP taking aspirin 325 mg START  Taking aspirin 81 mg once daily  If you need a refill on your cardiac medications before your next appointment, please call your pharmacy.   Lab work: NONE If you have labs (blood work) drawn today and your tests are completely normal, you will receive your results only by: Marland Kitchen MyChart Message (if you have MyChart) OR . A paper copy in the mail If you have any lab test that is abnormal or we need to change your treatment, we will call you to review the results.  Testing/Procedures: An EKG was performed on you today  Follow-Up: At [facility], you and your health needs are our priority.  As part of our continuing mission to provide you with exceptional heart care, we have created designated Provider Care Teams.  These Care Teams include your primary Cardiologist (physician) and Advanced Practice Providers (APPs -  Physician Assistants and Nurse Practitioners) who all work together to provide you with the care you need, when you need it. You will need a follow up appointment in 1 months.

## 2019-01-13 NOTE — Progress Notes (Signed)
Cardiology Office Note:    Date:  01/13/2019   ID:  Alexander Bowen, DOB [birth year], MRN [id number]  PCP:  Maryella Shivers, MD  Cardiologist:  Jenne Campus, MD    Referring MD: Maryella Shivers, MD   Chief Complaint  Patient presents with  . Follow-up  I have palpitations again  History of Present Illness:    Alexander Bowen is a 68 y.o. male with supraventricular tachycardia comes today to my office in follow-up for last few weeks he reports to have more palpitations he feels his heart rate skipping there is no sustained arrhythmia he described also to have episode of one epigastric pain that happened few days ago he was awakened by the sensation in the middle of the night has been followed by his primary care physician and plans are already made to schedule him to have ultrasounds of his gallbladder which I think is an excellent idea.  He goes to gym on the regular basis he stay and therefore about hour he does elliptical stationary bike and weightlifting.  Have no difficulty doing it noticed that his heart rate does not increase as much as previously usually stays below 100 bpm.  We had a long discussion about what to do with his palpitations advised him to temporarily increase dose of metoprolol from 37.5 twice daily to 50 twice a day on as-needed basis meaning if he get a good day he need to take 37.5 in bad days it is fine for me to take 50 mg twice daily morning obviously a bit but his bradycardia his EKG today show heart rate 52 which is sinus bradycardia normal QS complex duration morphology otherwise everything looks fine.  I would like to see him back in my office within a month to revisit dose issue Concern is the fact that he will may need to start him on different antiarrhythmic medication to separate extrasystole.  Flecainide comes to mind.  Past Medical History:  Diagnosis Date  . Dysrhythmia 2001   palpitations -checked out by Cardiologist  . GERD (gastroesophageal  reflux disease)   . PONV (postoperative nausea and vomiting)     Past Surgical History:  Procedure Laterality Date  . APPENDECTOMY    . LITHOTRIPSY     x 2  . ORIF PATELLA Left 08/05/2015   Procedure: OPEN REDUCTION INTERNAL (ORIF) LEFT  FIXATION PATELLA;  Surgeon: Susa Day, MD;  Location: [facility];  Service: Orthopedics;  Laterality: Left;  . STONE EXTRACTION WITH BASKET     x2  . TONSILLECTOMY      Current Medications: Current Meds  Medication Sig  . aspirin EC 325 MG tablet Take 325 mg by mouth daily.  . metoprolol tartrate (LOPRESSOR) 25 MG tablet TAKE 1 AND 1/2 TABLETS(37.5 MG) BY MOUTH TWICE DAILY  . omeprazole (PRILOSEC) 20 MG capsule Take 1 capsule by mouth as needed.  . simvastatin (ZOCOR) 40 MG tablet Take 40 mg by mouth at bedtime.     Allergies:   Patient has no known allergies.   Social History   Socioeconomic History  . Marital status: Married    Spouse name: Not on file  . Number of children: Not on file  . Years of education: Not on file  . Highest education level: Not on file  Occupational History  . Not on file  Social Needs  . Financial resource strain: Not on file  . Food insecurity:    Worry: Not on file    Inability: Not on  file  . Transportation needs:    Medical: Not on file    Non-medical: Not on file  Tobacco Use  . Smoking status: Former Smoker    Packs/day: 1.50    Years: 20.00    Pack years: 30.00    Types: Cigarettes    Last attempt to quit: 03/23/1989    Years since quitting: 29.8  . Smokeless tobacco: Never Used  Substance and Sexual Activity  . Alcohol use: Yes    Comment: occassionally  . Drug use: No  . Sexual activity: Not on file  Lifestyle  . Physical activity:    Days per week: Not on file    Minutes per session: Not on file  . Stress: Not on file  Relationships  . Social connections:    Talks on phone: Not on file    Gets together: Not on file    Attends religious service: Not on file    Active member of  club or organization: Not on file    Attends meetings of clubs or organizations: Not on file    Relationship status: Not on file  Other Topics Concern  . Not on file  Social History Narrative  . Not on file     Family History: The patient's family history includes Diabetes in his father; Heart disease in his mother; Heart failure in his mother. ROS:   Please see the history of present illness.    All 14 point review of systems negative except as described per history of present illness  EKGs/Labs/Other Studies Reviewed:      Recent Labs: 06/30/2018: BUN 15; Creatinine, Ser 1.17; Potassium 5.0; Sodium 140  Recent Lipid Panel No results found for: CHOL, TRIG, HDL, CHOLHDL, VLDL, LDLCALC, LDLDIRECT  Physical Exam:    VS:  BP (!) 150/62   Pulse 64   Ht 5\' 10"  (1.778 m)   Wt 191 lb 12.8 oz (87 kg)   SpO2 98%   BMI 27.52 kg/m     Wt Readings from Last 3 Encounters:  01/13/19 191 lb 12.8 oz (87 kg)  08/05/18 189 lb 6.4 oz (85.9 kg)  06/30/18 187 lb (84.8 kg)     GEN:  Well nourished, well developed in no acute distress HEENT: Normal NECK: No JVD; No carotid bruits LYMPHATICS: No lymphadenopathy CARDIAC: RRR, no murmurs, no rubs, no gallops RESPIRATORY:  Clear to auscultation without rales, wheezing or rhonchi  ABDOMEN: Soft, non-tender, non-distended MUSCULOSKELETAL:  No edema; No deformity  SKIN: Warm and dry LOWER EXTREMITIES: no swelling NEUROLOGIC:  Alert and oriented x 3 PSYCHIATRIC:  Normal affect   ASSESSMENT:    1. Supraventricular tachycardia (H[facility])   2. Palpitations   3. Dyslipidemia   4. Atypical chest pain    PLAN:    In order of problems listed above:  1. Supraventricular tachycardia seems to be suppressed. 2. Palpitations.  Plan as outlined above we will try to increase metoprolol 50 twice daily on as-needed basis.  If that will be unsuccessful consider starting flecainide.  I see him back in 1 month to revisit that issue. 3. Dyslipidemia  followed by primary care physician apparently well controlled. 4. Atypical chest pain last week he woke up in the middle of the night with epigastric pain.  Lasted for about 3 hours that being evaluated by primary care physician and plans already made to schedule him to have abdominal ultrasound to look at the gallbladder.   Medication Adjustments/Labs and Tests Ordered: Current medicines are reviewed at  length with the patient today.  Concerns regarding medicines are outlined above.  No orders of the defined types were placed in this encounter.  Medication changes: No orders of the defined types were placed in this encounter.   Signed, Park Liter, MD, [facility] 01/13/2019 10:01 AM    [place]

## 2019-01-14 DIAGNOSIS — K8020 Calculus of gallbladder without cholecystitis without obstruction: Secondary | ICD-10-CM | POA: Diagnosis not present

## 2019-01-14 DIAGNOSIS — R1011 Right upper quadrant pain: Secondary | ICD-10-CM | POA: Diagnosis not present

## 2019-01-14 DIAGNOSIS — K760 Fatty (change of) liver, not elsewhere classified: Secondary | ICD-10-CM | POA: Diagnosis not present

## 2019-01-16 ENCOUNTER — Telehealth: Payer: Self-pay | Admitting: Cardiology

## 2019-01-16 NOTE — Telephone Encounter (Signed)
Has been having SOB for 2 to 3 hours

## 2019-01-16 NOTE — Telephone Encounter (Signed)
Difficulty breathing at times while sitting all day today. Current BP 153/80, HR 65. No chest pain or symptoms other wise. RN consulted with Dr. Raliegh Ip and he advised patient to go to ED if symptoms persist. Patient has no further questions at this time but RN advised that she would check in on him on Monday morning.

## 2019-01-19 ENCOUNTER — Encounter: Payer: Self-pay | Admitting: Cardiology

## 2019-01-19 DIAGNOSIS — Z139 Encounter for screening, unspecified: Secondary | ICD-10-CM | POA: Diagnosis not present

## 2019-01-19 DIAGNOSIS — Z6827 Body mass index (BMI) 27.0-27.9, adult: Secondary | ICD-10-CM | POA: Diagnosis not present

## 2019-01-19 DIAGNOSIS — K8020 Calculus of gallbladder without cholecystitis without obstruction: Secondary | ICD-10-CM | POA: Diagnosis not present

## 2019-01-19 NOTE — Telephone Encounter (Signed)
RN called patient to check in on his current status. He states he is feeling better but still SOB and palpitations intermittently. Patient already scheduled for 02/17/19 appt but wants an earlier if available. RN consulted with L.Welch to pull patient in earlier.

## 2019-01-20 ENCOUNTER — Ambulatory Visit: Payer: Medicare Other | Admitting: Cardiology

## 2019-01-23 DIAGNOSIS — K8010 Calculus of gallbladder with chronic cholecystitis without obstruction: Secondary | ICD-10-CM | POA: Diagnosis not present

## 2019-02-10 DIAGNOSIS — I471 Supraventricular tachycardia: Secondary | ICD-10-CM | POA: Diagnosis not present

## 2019-02-10 DIAGNOSIS — I4891 Unspecified atrial fibrillation: Secondary | ICD-10-CM | POA: Diagnosis not present

## 2019-02-10 DIAGNOSIS — E782 Mixed hyperlipidemia: Secondary | ICD-10-CM | POA: Diagnosis not present

## 2019-02-10 DIAGNOSIS — R911 Solitary pulmonary nodule: Secondary | ICD-10-CM | POA: Diagnosis not present

## 2019-02-12 ENCOUNTER — Telehealth: Payer: Self-pay | Admitting: Emergency Medicine

## 2019-02-12 ENCOUNTER — Telehealth: Payer: Self-pay | Admitting: Cardiology

## 2019-02-12 NOTE — Telephone Encounter (Signed)
Left message for patient to return call regarding needed consent. Consent sent to mychart patient just needs to respond for upcoming televisit.

## 2019-02-17 ENCOUNTER — Encounter: Payer: Self-pay | Admitting: Cardiology

## 2019-02-17 ENCOUNTER — Ambulatory Visit: Payer: Medicare Other | Admitting: Cardiology

## 2019-02-17 ENCOUNTER — Other Ambulatory Visit: Payer: Self-pay

## 2019-02-17 ENCOUNTER — Telehealth (INDEPENDENT_AMBULATORY_CARE_PROVIDER_SITE_OTHER): Payer: Medicare Other | Admitting: Cardiology

## 2019-02-17 VITALS — BP 116/68 | HR 51 | Wt 183.0 lb

## 2019-02-17 DIAGNOSIS — I483 Typical atrial flutter: Secondary | ICD-10-CM

## 2019-02-17 DIAGNOSIS — K8020 Calculus of gallbladder without cholecystitis without obstruction: Secondary | ICD-10-CM

## 2019-02-17 DIAGNOSIS — E785 Hyperlipidemia, unspecified: Secondary | ICD-10-CM

## 2019-02-17 DIAGNOSIS — I471 Supraventricular tachycardia: Secondary | ICD-10-CM

## 2019-02-17 DIAGNOSIS — R0789 Other chest pain: Secondary | ICD-10-CM

## 2019-02-17 DIAGNOSIS — R002 Palpitations: Secondary | ICD-10-CM | POA: Diagnosis not present

## 2019-02-17 HISTORY — DX: Calculus of gallbladder without cholecystitis without obstruction: K80.20

## 2019-02-17 NOTE — Progress Notes (Signed)
Virtual Visit via Video Note   This visit type was conducted due to national recommendations for restrictions regarding the COVID-19 Pandemic (e.g. social distancing) in an effort to limit this patient's exposure and mitigate transmission in our community.  Due to his co-morbid illnesses, this patient is at least at moderate risk for complications without adequate follow up.  This format is felt to be most appropriate for this patient at this time.  All issues noted in this document were discussed and addressed.  A limited physical exam was performed with this format.  Please refer to the patient's chart for his consent to telehealth for [facility].  Evaluation Performed:  Follow-up visit  This visit type was conducted due to national recommendations for restrictions regarding the COVID-19 Pandemic (e.g. social distancing).  This format is felt to be most appropriate for this patient at this time.  All issues noted in this document were discussed and addressed.  No physical exam was performed (except for noted visual exam findings with Video Visits).  Please refer to the patient's chart (MyChart message for video visits and phone note for telephone visits) for the patient's consent to telehealth for [facility].  Date:  02/17/2019  ID: Alexander Bowen, DOB 01-Nov-1951, MRN [id number]   Patient Location:  [address] [place] [address]   Provider location:   [place] Office  PCP:  Maryella Shivers, MD  Cardiologist:  Jenne Campus, MD     Chief Complaint: I am doing better  History of Present Illness:    Alexander Bowen is a 68 y.o. male  who presents via audio/video conferencing for a telehealth visit today.  With history of supraventricular tachycardia, paroxysmal atrial flutter, palpitations.  Recently started having more palpitations but no sustained arrhythmia we elected to increase dose of metoprolol to 50 mg in the morning and 37.5 at evening time.  He  seems to be doing better.  Actually he told that this palpitation went completely away however lately he started having some few extra beats that happen typically at rest.  Another concern that the heart was his low heart rate when he was in the office was 51 now he tells me when he check his blood pressure he can see heart rate of 45-46.  There is no dizziness no passing out he still try to exercise on a regular basis.  He does not go to gym because it is closed because of coronavirus situation however he walks in a truck around [facility] and has no difficulty doing it.  He did not notice any decrease in his ability to exercise. He was also complained of having some abdominal epigastric pain ultrasound of his gallbladder has been done which showed gallstones.  He was scheduled to have surgery however it was canceled because of coronavirus.  He described to have rare sensation in abdomen.  And surgery still on the waiting list.   The patient does not have symptoms concerning for COVID-19 infection (fever, chills, cough, or new SHORTNESS OF BREATH).    Prior CV studies:   The following studies were reviewed today:  I reviewed stress test from August 2019 which showed no evidence of ischemia good exercise tolerance he walked more than 7 minutes on the treadmill     Past Medical History:  Diagnosis Date  . Dysrhythmia 2001   palpitations -checked out by Cardiologist  . GERD (gastroesophageal reflux disease)   . PONV (postoperative nausea and vomiting)  Past Surgical History:  Procedure Laterality Date  . APPENDECTOMY    . LITHOTRIPSY     x 2  . ORIF PATELLA Left 08/05/2015   Procedure: OPEN REDUCTION INTERNAL (ORIF) LEFT  FIXATION PATELLA;  Surgeon: Susa Day, MD;  Location: [facility];  Service: Orthopedics;  Laterality: Left;  . STONE EXTRACTION WITH BASKET     x2  . TONSILLECTOMY       Current Meds  Medication Sig  . aspirin EC 81 MG tablet Take 1 tablet (81 mg total) by mouth  daily.  . metoprolol tartrate (LOPRESSOR) 50 MG tablet Please take (1 tablet) 50 mg two times per day  . omeprazole (PRILOSEC) 20 MG capsule Take 1 capsule by mouth as needed.  . simvastatin (ZOCOR) 40 MG tablet Take 40 mg by mouth at bedtime.      Family History: The patient's family history includes Diabetes in his father; Heart disease in his mother; Heart failure in his mother.   ROS:   Please see the history of present illness.     All other systems reviewed and are negative.   Labs/Other Tests and Data Reviewed:     Recent Labs: 06/30/2018: BUN 15; Creatinine, Ser 1.17; Potassium 5.0; Sodium 140  Recent Lipid Panel No results found for: CHOL, TRIG, HDL, CHOLHDL, VLDL, LDLCALC, LDLDIRECT    Exam:    Vital Signs:  BP 116/68   Pulse (!) 51   Wt 183 lb (83 kg)   BMI 26.26 kg/m     Wt Readings from Last 3 Encounters:  02/17/19 183 lb (83 kg)  01/13/19 191 lb 12.8 oz (87 kg)  08/05/18 189 lb 6.4 oz (85.9 kg)     Well nourished, well developed male in no acute distress. We talk over a video link however he did have some difficulty with establishing audio link therefore we get hybrid connection I had a video link established with [email or web address] and at the same time we talked on different line.  He is alert awake oriented x3 not in any distress there is no JVD no swelling of lower extremities  Diagnosis for this visit:   1. Palpitations   2. Supraventricular tachycardia ([place])   3. Typical atrial flutter ([place])   4. Dyslipidemia   5. Atypical chest pain   6. Calculus of gallbladder without cholecystitis without obstruction      ASSESSMENT & PLAN:    1.  Palpitations improved with higher dose of beta-blocker which I will continue.  The plan was if that does not work we may be forced to go on renal antiarrhythmic like flecainide however lately he is doing well and he does not have any consequences of slow ventricular rate.  Therefore we will continue present management.  2.  Supraventricular tachycardia denies having any. 3.  Typical atrial flutter.  Denies having any sustained arrhythmia.  He used to be taking aspirin however stopped it because of nosebleed.  His chads 2 Vascor equals only 1. 4.  Dyslipidemia continue present management. 5.  Atypical chest pain most likely related to his gallbladder problem.  He is scheduled to have gallbladder surgery after coronavirus situation will improved  COVID-19 Education: The signs and symptoms of COVID-19 were discussed with the patient and how to seek care for testing (follow up with PCP or arrange E-visit).  The importance of social distancing was discussed today.  Patient Risk:   After full review of this patients clinical status, I feel that they are at least moderate  risk at this time.  Time:   Today, I have spent 18 minutes with the patient with telehealth technology discussing pt health issues. Visit was finished at 10:41 AM.  I spent 10 minutes reviewing his chart before the visit.    Medication Adjustments/Labs and Tests Ordered: Current medicines are reviewed at length with the patient today.  Concerns regarding medicines are outlined above.  No orders of the defined types were placed in this encounter.  Medication changes: No orders of the defined types were placed in this encounter.    Disposition: 3 months follow-up  Signed, Park Liter, MD, [facility] 02/17/2019 10:37 AM    [place]

## 2019-02-17 NOTE — Patient Instructions (Signed)
Medication Instructions:  Your physician recommends that you continue on your current medications as directed. Please refer to the Current Medication list given to you today.  If you need a refill on your cardiac medications before your next appointment, please call your pharmacy.   Lab work: None  If you have labs (blood work) drawn today and your tests are completely normal, you will receive your results only by: Marland Kitchen MyChart Message (if you have MyChart) OR . A paper copy in the mail If you have any lab test that is abnormal or we need to change your treatment, we will call you to review the results.  Testing/Procedures: none  Follow-Up: At [facility], you and your health needs are our priority.  As part of our continuing mission to provide you with exceptional heart care, we have created designated Provider Care Teams.  These Care Teams include your primary Cardiologist (physician) and Advanced Practice Providers (APPs -  Physician Assistants and Nurse Practitioners) who all work together to provide you with the care you need, when you need it. You will need a follow up appointment in 3 months.  Please call our office 2 months in advance to schedule this appointment.  You may see No primary care provider on file. or another member of our [employer] Provider Team in [place]: Shirlee More, MD . Jyl Heinz, MD  Any Other Special Instructions Will Be Listed Below (If Applicable).

## 2019-02-17 NOTE — Telephone Encounter (Signed)
done

## 2019-03-03 DIAGNOSIS — Z0000 Encounter for general adult medical examination without abnormal findings: Secondary | ICD-10-CM | POA: Diagnosis not present

## 2019-03-03 DIAGNOSIS — Z139 Encounter for screening, unspecified: Secondary | ICD-10-CM | POA: Diagnosis not present

## 2019-03-03 DIAGNOSIS — Z1331 Encounter for screening for depression: Secondary | ICD-10-CM | POA: Diagnosis not present

## 2019-03-12 DIAGNOSIS — I471 Supraventricular tachycardia: Secondary | ICD-10-CM | POA: Diagnosis not present

## 2019-03-12 DIAGNOSIS — I4891 Unspecified atrial fibrillation: Secondary | ICD-10-CM | POA: Diagnosis not present

## 2019-03-12 DIAGNOSIS — E782 Mixed hyperlipidemia: Secondary | ICD-10-CM | POA: Diagnosis not present

## 2019-03-24 DIAGNOSIS — K8010 Calculus of gallbladder with chronic cholecystitis without obstruction: Secondary | ICD-10-CM | POA: Diagnosis not present

## 2019-03-25 ENCOUNTER — Telehealth: Payer: Self-pay | Admitting: Family

## 2019-03-25 DIAGNOSIS — R002 Palpitations: Secondary | ICD-10-CM

## 2019-03-25 DIAGNOSIS — R0789 Other chest pain: Secondary | ICD-10-CM

## 2019-03-25 DIAGNOSIS — I471 Supraventricular tachycardia: Secondary | ICD-10-CM

## 2019-03-25 DIAGNOSIS — I483 Typical atrial flutter: Secondary | ICD-10-CM

## 2019-03-25 DIAGNOSIS — E785 Hyperlipidemia, unspecified: Secondary | ICD-10-CM

## 2019-03-25 NOTE — Telephone Encounter (Signed)
Received request from Dr. Orrin Brigham for cardiac clearance for Mr. Alexander Bowen for a laparoscopic cholecystectomy on 04/09/19. Spoke with Mr. Alexander Bowen on the phone.   Mr. Alexander Bowen is a 68yo male last seen by Dr. Agustin Cree on 02/17/2019. PMH SVT, paroxysmal atrial flutter, palpitations, DLD, and GERD. He denies shortness of breath and chest pain. He exercises by walking a couple of miles daily with his wife, no DOE. Reports no change in his palpitations. They occur most days right before he takes his morning medications, are intermittent, last 30 minutes, and do not require him to stop his activity. They are not associated with SOB or angina. They resolve about an hour after he takes his morning medications. He states this is unchanged since his last office visit. His last episode of atrial flutter was in 2017 during a hospitalization, no evidence of recurrence.   BMP August 2019 unremarkable.   Stress echo 07/02/2018: No evidence of stress-induced ischemia. Isolated ventricular ectopy which did not increase during exercise or recovery. Normal functional capacity.   EKG 01/13/19: Sinus bradycardia  CHAsVASC score of 1. Reports compliance with his Aspirin 81mg  daily. He was asked to stop prior to procedure by Dr. Lilia Pro. Dr. Lilia Pro will tell the patient when it is safe to resume aspirin after the procedure.   Patient is cleared for laparoscopic cholecystectomy by this office. Requested records and cardiac clearance will be faxed to [name]'s office. Cardiac clearance to be signed by Dr. Geraldo Pitter.   Loel Dubonnet, NP

## 2019-03-26 NOTE — Telephone Encounter (Signed)
Thank you Caitlin. Good job!

## 2019-04-09 DIAGNOSIS — J309 Allergic rhinitis, unspecified: Secondary | ICD-10-CM | POA: Diagnosis not present

## 2019-04-09 DIAGNOSIS — K8020 Calculus of gallbladder without cholecystitis without obstruction: Secondary | ICD-10-CM | POA: Diagnosis not present

## 2019-04-09 DIAGNOSIS — M359 Systemic involvement of connective tissue, unspecified: Secondary | ICD-10-CM | POA: Diagnosis not present

## 2019-04-09 DIAGNOSIS — R0683 Snoring: Secondary | ICD-10-CM | POA: Diagnosis not present

## 2019-04-09 DIAGNOSIS — Z87891 Personal history of nicotine dependence: Secondary | ICD-10-CM | POA: Diagnosis not present

## 2019-04-09 DIAGNOSIS — E785 Hyperlipidemia, unspecified: Secondary | ICD-10-CM | POA: Diagnosis not present

## 2019-04-09 DIAGNOSIS — Z79899 Other long term (current) drug therapy: Secondary | ICD-10-CM | POA: Diagnosis not present

## 2019-04-09 DIAGNOSIS — Z7982 Long term (current) use of aspirin: Secondary | ICD-10-CM | POA: Diagnosis not present

## 2019-04-09 DIAGNOSIS — K219 Gastro-esophageal reflux disease without esophagitis: Secondary | ICD-10-CM | POA: Diagnosis not present

## 2019-04-09 DIAGNOSIS — K8010 Calculus of gallbladder with chronic cholecystitis without obstruction: Secondary | ICD-10-CM | POA: Diagnosis not present

## 2019-04-09 DIAGNOSIS — I10 Essential (primary) hypertension: Secondary | ICD-10-CM | POA: Diagnosis not present

## 2019-04-10 DIAGNOSIS — E782 Mixed hyperlipidemia: Secondary | ICD-10-CM | POA: Diagnosis not present

## 2019-04-10 DIAGNOSIS — I4891 Unspecified atrial fibrillation: Secondary | ICD-10-CM | POA: Diagnosis not present

## 2019-04-10 DIAGNOSIS — I471 Supraventricular tachycardia: Secondary | ICD-10-CM | POA: Diagnosis not present

## 2019-04-14 ENCOUNTER — Other Ambulatory Visit: Payer: Self-pay

## 2019-04-14 ENCOUNTER — Telehealth: Payer: Self-pay | Admitting: Cardiology

## 2019-04-14 ENCOUNTER — Encounter: Payer: Self-pay | Admitting: Cardiology

## 2019-04-14 ENCOUNTER — Telehealth (INDEPENDENT_AMBULATORY_CARE_PROVIDER_SITE_OTHER): Payer: Medicare Other | Admitting: Cardiology

## 2019-04-14 VITALS — BP 168/85 | HR 61 | Wt 183.0 lb

## 2019-04-14 DIAGNOSIS — R002 Palpitations: Secondary | ICD-10-CM | POA: Diagnosis not present

## 2019-04-14 DIAGNOSIS — I471 Supraventricular tachycardia, unspecified: Secondary | ICD-10-CM

## 2019-04-14 DIAGNOSIS — E785 Hyperlipidemia, unspecified: Secondary | ICD-10-CM

## 2019-04-14 NOTE — Progress Notes (Signed)
Virtual Visit via Video Note   This visit type was conducted due to national recommendations for restrictions regarding the COVID-19 Pandemic (e.g. social distancing) in an effort to limit this patient's exposure and mitigate transmission in our community.  Due to his co-morbid illnesses, this patient is at least at moderate risk for complications without adequate follow up.  This format is felt to be most appropriate for this patient at this time.  All issues noted in this document were discussed and addressed.  A limited physical exam was performed with this format.  Please refer to the patient's chart for his consent to telehealth for [facility].  Evaluation Performed:  Follow-up visit  This visit type was conducted due to national recommendations for restrictions regarding the COVID-19 Pandemic (e.g. social distancing).  This format is felt to be most appropriate for this patient at this time.  All issues noted in this document were discussed and addressed.  No physical exam was performed (except for noted visual exam findings with Video Visits).  Please refer to the patient's chart (MyChart message for video visits and phone note for telephone visits) for the patient's consent to telehealth for [facility].  Date:  04/14/2019  ID: Alexander Bowen, DOB 03/16/1951, MRN [id number]   Patient Location: [address] [place] [address]   Provider location:   [place] Office  PCP:  Maryella Shivers, MD  Cardiologist:  Jenne Campus, MD     Chief Complaint: I have no palpitations  History of Present Illness:    Alexander Bowen is a 68 y.o. male  who presents via audio/video conferencing for a telehealth visit today.  With history of supraventricular tachycardia, extrasystole, palpitations.  He is being successfully managed so far with beta-blocker but things are getting out of control for simple he does have resting bradycardia which is exaggerated by  beta-blocker and if he continue with beta-blocker he got more palpitations.  We talked in length about what to do with the situation and we elected to switch him to antiarrhythmic therapy.  He did have stress test done last year which was negative.  He looks very nice 7 minutes.  He can walk exercise with no difficulty have no problem doing it.  The plan will be to bring him tomorrow to the office to do EKG if EKG is fine I will start him on flecainide.  I will schedule him also to have echocardiogram to assess left ventricular ejection fraction however last time his ejection fraction was normal.  I also will discontinue his metoprolol and initiate small dose of calcium channel blocker like diltiazem.   The patient does not have symptoms concerning for COVID-19 infection (fever, chills, cough, or new SHORTNESS OF BREATH).    Prior CV studies:   The following studies were reviewed today:  Stress test done 1 last year was negative     Past Medical History:  Diagnosis Date  . Dysrhythmia 2001   palpitations -checked out by Cardiologist  . GERD (gastroesophageal reflux disease)   . PONV (postoperative nausea and vomiting)     Past Surgical History:  Procedure Laterality Date  . APPENDECTOMY    . LITHOTRIPSY     x 2  . ORIF PATELLA Left 08/05/2015   Procedure: OPEN REDUCTION INTERNAL (ORIF) LEFT  FIXATION PATELLA;  Surgeon: Susa Day, MD;  Location: [facility];  Service: Orthopedics;  Laterality: Left;  . STONE EXTRACTION WITH BASKET     x2  .  TONSILLECTOMY       Current Meds  Medication Sig  . aspirin EC 81 MG tablet Take 1 tablet (81 mg total) by mouth daily.  . metoprolol tartrate (LOPRESSOR) 50 MG tablet Please take (1 tablet) 50 mg two times per day  . omeprazole (PRILOSEC) 20 MG capsule Take 1 capsule by mouth as needed.  . simvastatin (ZOCOR) 40 MG tablet Take 40 mg by mouth at bedtime.      Family History: The patient's family history includes Diabetes in his father;  Heart disease in his mother; Heart failure in his mother.   ROS:   Please see the history of present illness.     All other systems reviewed and are negative.   Labs/Other Tests and Data Reviewed:     Recent Labs: 06/30/2018: BUN 15; Creatinine, Ser 1.17; Potassium 5.0; Sodium 140  Recent Lipid Panel No results found for: CHOL, TRIG, HDL, CHOLHDL, VLDL, LDLCALC, LDLDIRECT    Exam:    Vital Signs:  BP (!) 168/85   Pulse 61   Wt 183 lb (83 kg)   BMI 26.26 kg/m     Wt Readings from Last 3 Encounters:  04/14/19 183 lb (83 kg)  02/17/19 183 lb (83 kg)  01/13/19 191 lb 12.8 oz (87 kg)     Well nourished, well developed in no acute distress. Alert awake and attentive 3 happy to be able to talk to me over the phone he had gallbladder surgery done a week ago.  Recovering very nicely after that.  Diagnosis for this visit:   1. Palpitations   2. Dyslipidemia   3. Supraventricular tachycardia ([place])      ASSESSMENT & PLAN:    1.  Palpitations plan as outlined above. 2.  Dyslipidemia continue present medications. 3.  Supraventricular tachycardia will initiate flecainide and diltiazem tomorrow  COVID-19 Education: The signs and symptoms of COVID-19 were discussed with the patient and how to seek care for testing (follow up with PCP or arrange E-visit).  The importance of social distancing was discussed today.  Patient Risk:   After full review of this patients clinical status, I feel that they are at least moderate risk at this time.  Time:   Today, I have spent 16 minutes with the patient with telehealth technology discussing pt health issues.  I spent 10 minutes reviewing her chart before the visit.  Visit was finished at 4:30 PM.    Medication Adjustments/Labs and Tests Ordered: Current medicines are reviewed at length with the patient today.  Concerns regarding medicines are outlined above.  No orders of the defined types were placed in this encounter.  Medication  changes: No orders of the defined types were placed in this encounter.    Disposition: Follow-up 2 weeks  Signed, Park Liter, MD, [facility] 04/14/2019 4:30 PM    [place]

## 2019-04-14 NOTE — Patient Instructions (Signed)
Medication Instructions:  Your physician recommends that you continue on your current medications as directed. Please refer to the Current Medication list given to you today.  If you need a refill on your cardiac medications before your next appointment, please call your pharmacy.   Lab work: None.  If you have labs (blood work) drawn today and your tests are completely normal, you will receive your results only by: Marland Kitchen MyChart Message (if you have MyChart) OR . A paper copy in the mail If you have any lab test that is abnormal or we need to change your treatment, we will call you to review the results.  Testing/Procedures: Your physician has requested that you have an echocardiogram. Echocardiography is a painless test that uses sound waves to create images of your heart. It provides your doctor with information about the size and shape of your heart and how well your heart's chambers and valves are working. This procedure takes approximately one hour. There are no restrictions for this procedure.    Follow-Up: At [facility], you and your health needs are our priority.  As part of our continuing mission to provide you with exceptional heart care, we have created designated Provider Care Teams.  These Care Teams include your primary Cardiologist (physician) and Advanced Practice Providers (APPs -  Physician Assistants and Nurse Practitioners) who all work together to provide you with the care you need, when you need it. You will need a follow up appointment in 2 weeks.  Please call our office 2 months in advance to schedule this appointment.  You may see No primary care provider on file. or another member of our [employer] Provider Team in [place]: Shirlee More, MD . Jyl Heinz, MD  Any Other Special Instructions Will Be Listed Below (If Applicable).   Echocardiogram An echocardiogram is a procedure that uses painless sound waves (ultrasound) to produce an image of the heart.  Images from an echocardiogram can provide important information about:  Signs of coronary artery disease (CAD).  Aneurysm detection. An aneurysm is a weak or damaged part of an artery wall that bulges out from the normal force of blood pumping through the body.  Heart size and shape. Changes in the size or shape of the heart can be associated with certain conditions, including heart failure, aneurysm, and CAD.  Heart muscle function.  Heart valve function.  Signs of a past heart attack.  Fluid buildup around the heart.  Thickening of the heart muscle.  A tumor or infectious growth around the heart valves. Tell a health care provider about:  Any allergies you have.  All medicines you are taking, including vitamins, herbs, eye drops, creams, and over-the-counter medicines.  Any blood disorders you have.  Any surgeries you have had.  Any medical conditions you have.  Whether you are pregnant or may be pregnant. What are the risks? Generally, this is a safe procedure. However, problems may occur, including:  Allergic reaction to dye (contrast) that may be used during the procedure. What happens before the procedure? No specific preparation is needed. You may eat and drink normally. What happens during the procedure?   An IV tube may be inserted into one of your veins.  You may receive contrast through this tube. A contrast is an injection that improves the quality of the pictures from your heart.  A gel will be applied to your chest.  A wand-like tool (transducer) will be moved over your chest. The gel will help to  transmit the sound waves from the transducer.  The sound waves will harmlessly bounce off of your heart to allow the heart images to be captured in real-time motion. The images will be recorded on a computer. The procedure may vary among health care providers and hospitals. What happens after the procedure?  You may return to your normal, everyday life,  including diet, activities, and medicines, unless your health care provider tells you not to do that. Summary  An echocardiogram is a procedure that uses painless sound waves (ultrasound) to produce an image of the heart.  Images from an echocardiogram can provide important information about the size and shape of your heart, heart muscle function, heart valve function, and fluid buildup around your heart.  You do not need to do anything to prepare before this procedure. You may eat and drink normally.  After the echocardiogram is completed, you may return to your normal, everyday life, unless your health care provider tells you not to do that. This information is not intended to replace advice given to you by your health care provider. Make sure you discuss any questions you have with your health care provider. Document Released: 10/26/2000 Document Revised: 12/01/2016 Document Reviewed: 12/01/2016 Elsevier Interactive Patient Education  2019 [employer]    

## 2019-04-14 NOTE — Telephone Encounter (Signed)
Scheduled appt for video visit at 3:40 today

## 2019-04-14 NOTE — Telephone Encounter (Signed)
His palpitations are getting worse

## 2019-04-15 ENCOUNTER — Encounter: Payer: Self-pay | Admitting: Cardiology

## 2019-04-15 ENCOUNTER — Ambulatory Visit (INDEPENDENT_AMBULATORY_CARE_PROVIDER_SITE_OTHER): Payer: Medicare Other | Admitting: Cardiology

## 2019-04-15 VITALS — BP 126/74 | Ht 70.0 in | Wt 185.0 lb

## 2019-04-15 DIAGNOSIS — R002 Palpitations: Secondary | ICD-10-CM | POA: Diagnosis not present

## 2019-04-15 DIAGNOSIS — E785 Hyperlipidemia, unspecified: Secondary | ICD-10-CM

## 2019-04-15 DIAGNOSIS — I483 Typical atrial flutter: Secondary | ICD-10-CM | POA: Diagnosis not present

## 2019-04-15 MED ORDER — FLECAINIDE ACETATE 50 MG PO TABS: 50.0000 mg | ORAL_TABLET | Freq: Two times a day (BID) | ORAL | 1 refills | Status: DC

## 2019-04-15 NOTE — Patient Instructions (Signed)
Medication Instructions:  Your physician has recommended you make the following change in your medication:  STOP: Metoprolol  START: Flecainide 50 mg twice daily.   If you need a refill on your cardiac medications before your next appointment, please call your pharmacy.   Lab work: None.  If you have labs (blood work) drawn today and your tests are completely normal, you will receive your results only by: Marland Kitchen MyChart Message (if you have MyChart) OR . A paper copy in the mail If you have any lab test that is abnormal or we need to change your treatment, we will call you to review the results.  Testing/Procedures: None.   Follow-Up: Follow up as previously advised   Any Other Special Instructions Will Be Listed Below (If Applicable).  You will need to come to the office Friday for another ekg.   Flecainide tablets What is this medicine? FLECAINIDE (FLEK a nide) is an antiarrhythmic drug. This medicine is used to prevent irregular heart rhythm. It can also slow down fast heartbeats called tachycardia. This medicine may be used for other purposes; ask your health care provider or pharmacist if you have questions. COMMON BRAND NAME(S): Tambocor What should I tell my health care provider before I take this medicine? They need to know if you have any of these conditions: -abnormal levels of potassium in the blood -heart disease including heart rhythm and heart rate problems -kidney or liver disease -recent heart attack -an unusual or allergic reaction to flecainide, local anesthetics, other medicines, foods, dyes, or preservatives -pregnant or trying to get pregnant -breast-feeding How should I use this medicine? Take this medicine by mouth with a glass of water. Follow the directions on the prescription label. You can take this medicine with or without food. Take your doses at regular intervals. Do not take your medicine more often than directed. Do not stop taking this medicine  suddenly. This may cause serious, heart-related side effects. If your doctor wants you to stop the medicine, the dose may be slowly lowered over time to avoid any side effects. Talk to your pediatrician regarding the use of this medicine in children. While this drug may be prescribed for children as young as 1 year of age for selected conditions, precautions do apply. Overdosage: If you think you have taken too much of this medicine contact a poison control center or emergency room at once. NOTE: This medicine is only for you. Do not share this medicine with others. What if I miss a dose? If you miss a dose, take it as soon as you can. If it is almost time for your next dose, take only that dose. Do not take double or extra doses. What may interact with this medicine? Do not take this medicine with any of the following medications: -amoxapine -arsenic trioxide -certain antibiotics like clarithromycin, erythromycin, gatifloxacin, gemifloxacin, levofloxacin, moxifloxacin, sparfloxacin, or troleandomycin -certain antidepressants called tricyclic antidepressants like amitriptyline, imipramine, or nortriptyline -certain medicines to control heart rhythm like disopyramide, dofetilide, encainide, moricizine, procainamide, propafenone, and quinidine -cisapride -cyclobenzaprine -delavirdine -droperidol -haloperidol -hawthorn -imatinib -levomethadyl -maprotiline -medicines for malaria like chloroquine and halofantrine -pentamidine -phenothiazines like chlorpromazine, mesoridazine, prochlorperazine, thioridazine -pimozide -quinine -ranolazine -ritonavir -sertindole -ziprasidone This medicine may also interact with the following medications: -cimetidine -medicines for angina or high blood pressure -medicines to control heart rhythm like amiodarone and digoxin This list may not describe all possible interactions. Give your health care provider a list of all the medicines, herbs,  non-prescription drugs, or dietary  supplements you use. Also tell them if you smoke, drink alcohol, or use illegal drugs. Some items may interact with your medicine. What should I watch for while using this medicine? Visit your doctor or health care professional for regular checks on your progress. Because your condition and the use of this medicine carries some risk, it is a good idea to carry an identification card, necklace or bracelet with details of your condition, medications and doctor or health care professional. Check your blood pressure and pulse rate regularly. Ask your health care professional what your blood pressure and pulse rate should be, and when you should contact him or her. Your doctor or health care professional also may schedule regular blood tests and electrocardiograms to check your progress. You may get drowsy or dizzy. Do not drive, use machinery, or do anything that needs mental alertness until you know how this medicine affects you. Do not stand or sit up quickly, especially if you are an older patient. This reduces the risk of dizzy or fainting spells. Alcohol can make you more dizzy, increase flushing and rapid heartbeats. Avoid alcoholic drinks. What side effects may I notice from receiving this medicine? Side effects that you should report to your doctor or health care professional as soon as possible: -chest pain, continued irregular heartbeats -difficulty breathing -swelling of the legs or feet -trembling, shaking -unusually weak or tired Side effects that usually do not require medical attention (report to your doctor or health care professional if they continue or are bothersome): -blurred vision -constipation -headache -nausea, vomiting -stomach pain This list may not describe all possible side effects. Call your doctor for medical advice about side effects. You may report side effects to FDA at 1-800-FDA-1088. Where should I keep my medicine? Keep out of the  reach of children. Store at room temperature between 15 and 30 degrees C (59 and 86 degrees F). Protect from light. Keep container tightly closed. Throw away any unused medicine after the expiration date. NOTE: This sheet is a summary. It may not cover all possible information. If you have questions about this medicine, talk to your doctor, pharmacist, or health care provider.  2019 Elsevier/Gold Standard (2008-03-03 16:46:09)

## 2019-04-15 NOTE — Progress Notes (Signed)
Cardiology Office Note:    Date:  04/15/2019   ID:  Alexander Bowen, DOB Jan 14, 1951, MRN [id number]  PCP:  Maryella Shivers, MD  Cardiologist:  Jenne Campus, MD    Referring MD: Maryella Shivers, MD   Chief Complaint  Patient presents with  . Here for EKG  Doing fine I am just here for EKG  History of Present Illness:    Alexander Bowen is a 68 y.o. male with recurrent palpitations I did see him yesterday and plan has been created I brought him today to have EKG done EKG looks fine QT interval corrected is acceptable therefore we will discontinue his metoprolol and will start him on flecainide 50 twice daily.  I bring him back on Friday and then probably will add diltiazem 120 mg daily.  I warned him about the potential side effects of flecainide.  In the future we will do EKG treadmill stress test on him.  Past Medical History:  Diagnosis Date  . Dysrhythmia 2001   palpitations -checked out by Cardiologist  . GERD (gastroesophageal reflux disease)   . PONV (postoperative nausea and vomiting)     Past Surgical History:  Procedure Laterality Date  . APPENDECTOMY    . LITHOTRIPSY     x 2  . ORIF PATELLA Left 08/05/2015   Procedure: OPEN REDUCTION INTERNAL (ORIF) LEFT  FIXATION PATELLA;  Surgeon: Susa Day, MD;  Location: [facility];  Service: Orthopedics;  Laterality: Left;  . STONE EXTRACTION WITH BASKET     x2  . TONSILLECTOMY      Current Medications: Current Meds  Medication Sig  . aspirin EC 81 MG tablet Take 1 tablet (81 mg total) by mouth daily.  Marland Kitchen omeprazole (PRILOSEC) 20 MG capsule Take 1 capsule by mouth as needed.  . simvastatin (ZOCOR) 40 MG tablet Take 40 mg by mouth at bedtime.  . [DISCONTINUED] metoprolol tartrate (LOPRESSOR) 50 MG tablet Please take (1 tablet) 50 mg two times per day     Allergies:   Patient has no known allergies.   Social History   Socioeconomic History  . Marital status: Married    Spouse name: Not on file  . Number of  children: Not on file  . Years of education: Not on file  . Highest education level: Not on file  Occupational History  . Not on file  Social Needs  . Financial resource strain: Not on file  . Food insecurity:    Worry: Not on file    Inability: Not on file  . Transportation needs:    Medical: Not on file    Non-medical: Not on file  Tobacco Use  . Smoking status: Former Smoker    Packs/day: 1.50    Years: 20.00    Pack years: 30.00    Types: Cigarettes    Last attempt to quit: 03/23/1989    Years since quitting: 30.0  . Smokeless tobacco: Never Used  Substance and Sexual Activity  . Alcohol use: Yes    Comment: occassionally  . Drug use: No  . Sexual activity: Not on file  Lifestyle  . Physical activity:    Days per week: Not on file    Minutes per session: Not on file  . Stress: Not on file  Relationships  . Social connections:    Talks on phone: Not on file    Gets together: Not on file    Attends religious service: Not on file    Active member of club  or organization: Not on file    Attends meetings of clubs or organizations: Not on file    Relationship status: Not on file  Other Topics Concern  . Not on file  Social History Narrative  . Not on file     Family History: The patient's family history includes Diabetes in his father; Heart disease in his mother; Heart failure in his mother. ROS:   Please see the history of present illness.    All 14 point review of systems negative except as described per history of present illness  EKGs/Labs/Other Studies Reviewed:      Recent Labs: 06/30/2018: BUN 15; Creatinine, Ser 1.17; Potassium 5.0; Sodium 140  Recent Lipid Panel No results found for: CHOL, TRIG, HDL, CHOLHDL, VLDL, LDLCALC, LDLDIRECT  Physical Exam:    VS:  BP 126/74   Ht 5\' 10"  (1.778 m)   Wt 185 lb (83.9 kg)   SpO2 98%   BMI 26.54 kg/m     Wt Readings from Last 3 Encounters:  04/15/19 185 lb (83.9 kg)  04/14/19 183 lb (83 kg)  02/17/19  183 lb (83 kg)     GEN:  Well nourished, well developed in no acute distress HEENT: Normal NECK: No JVD; No carotid bruits LYMPHATICS: No lymphadenopathy CARDIAC: RRR, no murmurs, no rubs, no gallops RESPIRATORY:  Clear to auscultation without rales, wheezing or rhonchi  ABDOMEN: Soft, non-tender, non-distended MUSCULOSKELETAL:  No edema; No deformity  SKIN: Warm and dry LOWER EXTREMITIES: no swelling NEUROLOGIC:  Alert and oriented x 3 PSYCHIATRIC:  Normal affect   ASSESSMENT:    1. Typical atrial flutter (HCC)   2. Palpitations   3. Dyslipidemia    PLAN:    In order of problems listed above:  1. History of flutter denies having any. 2. 2.  Palpitations plan as outlined above. 3. Dyslipidemia continue present management   Medication Adjustments/Labs and Tests Ordered: Current medicines are reviewed at length with the patient today.  Concerns regarding medicines are outlined above.  Orders Placed This Encounter  Procedures  . EKG 12-Lead   Medication changes:  Meds ordered this encounter  Medications  . flecainide (TAMBOCOR) 50 MG tablet    Sig: Take 1 tablet (50 mg total) by mouth 2 (two) times daily.    Dispense:  180 tablet    Refill:  1    Signed, Park Liter, MD, [facility] 04/15/2019 4:29 PM    [place]

## 2019-04-17 ENCOUNTER — Other Ambulatory Visit: Payer: Self-pay

## 2019-04-17 ENCOUNTER — Ambulatory Visit (INDEPENDENT_AMBULATORY_CARE_PROVIDER_SITE_OTHER): Payer: Medicare Other | Admitting: Cardiology

## 2019-04-17 ENCOUNTER — Encounter: Payer: Self-pay | Admitting: Cardiology

## 2019-04-17 VITALS — BP 140/82 | HR 69 | Wt 184.0 lb

## 2019-04-17 DIAGNOSIS — I471 Supraventricular tachycardia: Secondary | ICD-10-CM | POA: Diagnosis not present

## 2019-04-17 DIAGNOSIS — Z6827 Body mass index (BMI) 27.0-27.9, adult: Secondary | ICD-10-CM | POA: Diagnosis not present

## 2019-04-17 DIAGNOSIS — Z09 Encounter for follow-up examination after completed treatment for conditions other than malignant neoplasm: Secondary | ICD-10-CM | POA: Diagnosis not present

## 2019-04-17 DIAGNOSIS — R002 Palpitations: Secondary | ICD-10-CM

## 2019-04-17 NOTE — Progress Notes (Signed)
Cardiology Office Note:    Date:  04/17/2019   ID:  Alexander Bowen, DOB [birth year], MRN [id number]  PCP:  Alexander Shivers, MD  Cardiologist:  Alexander Campus, MD    Referring MD: Alexander Shivers, MD   Chief Complaint  Patient presents with  . Follow up EKG  I am not doing well  History of Present Illness:    [name] is a 68 y.o. male frequent ventricular ectopy with a lot of palpitations.  Initially treated with metoprolol however with not much response I eventually decided to switch him to flecainide I started small dose 50 mg twice daily with dual beta-blocker however today he comes to my office for EKG EKG looks fine with QT corrected still acceptable however he describes shakiness and nervousness I suspect he experienced effect of beta-blocker withdrawal he was taking 100 mg before daily.  I wanted him to go back on half of the dose that he was taking before.  25 mg twice a Bowen.  Past Medical History:  Diagnosis Date  . Dysrhythmia 2001   palpitations -checked out by Cardiologist  . GERD (gastroesophageal reflux disease)   . PONV (postoperative nausea and vomiting)     Past Surgical History:  Procedure Laterality Date  . APPENDECTOMY    . LITHOTRIPSY     x 2  . ORIF PATELLA Left 08/05/2015   Procedure: OPEN REDUCTION INTERNAL (ORIF) LEFT  FIXATION PATELLA;  Surgeon: Alexander Day, MD;  Location: [facility];  Service: Orthopedics;  Laterality: Left;  . STONE EXTRACTION WITH BASKET     x2  . TONSILLECTOMY      Current Medications: Current Meds  Medication Sig  . aspirin EC 81 MG tablet Take 1 tablet (81 mg total) by mouth daily.  . flecainide (TAMBOCOR) 50 MG tablet Take 1 tablet (50 mg total) by mouth 2 (two) times daily.  Marland Kitchen omeprazole (PRILOSEC) 20 MG capsule Take 1 capsule by mouth as needed.  . simvastatin (ZOCOR) 40 MG tablet Take 40 mg by mouth at bedtime.     Allergies:   Patient has no known allergies.   Social History   Socioeconomic History   . Marital status: Married    Spouse name: Not on file  . Number of children: Not on file  . Years of education: Not on file  . Highest education level: Not on file  Occupational History  . Not on file  Social Needs  . Financial resource strain: Not on file  . Food insecurity:    Worry: Not on file    Inability: Not on file  . Transportation needs:    Medical: Not on file    Non-medical: Not on file  Tobacco Use  . Smoking status: Former Smoker    Packs/Bowen: 1.50    Years: 20.00    Pack years: 30.00    Types: Cigarettes    Last attempt to quit: 03/23/1989    Years since quitting: 30.0  . Smokeless tobacco: Never Used  Substance and Sexual Activity  . Alcohol use: Yes    Comment: occassionally  . Drug use: No  . Sexual activity: Not on file  Lifestyle  . Physical activity:    Days per week: Not on file    Minutes per session: Not on file  . Stress: Not on file  Relationships  . Social connections:    Talks on phone: Not on file    Gets together: Not on file    Attends religious  service: Not on file    Active member of club or organization: Not on file    Attends meetings of clubs or organizations: Not on file    Relationship status: Not on file  Other Topics Concern  . Not on file  Social History Narrative  . Not on file     Family History: The patient's family history includes Diabetes in his father; Heart disease in his mother; Heart failure in his mother. ROS:   Please see the history of present illness.    All 14 point review of systems negative except as described per history of present illness  EKGs/Labs/Other Studies Reviewed:    Sinus rhythm right axis deviation, premature ventricular beats.  QT corrected is 461 ms  Recent Labs: 06/30/2018: BUN 15; Creatinine, Ser 1.17; Potassium 5.0; Sodium 140  Recent Lipid Panel No results found for: CHOL, TRIG, HDL, CHOLHDL, VLDL, LDLCALC, LDLDIRECT  Physical Exam:    VS:  BP 140/82   Pulse 69   Wt 184 lb  (83.5 kg)   SpO2 98%   BMI 26.40 kg/m     Wt Readings from Last 3 Encounters:  04/17/19 184 lb (83.5 kg)  04/15/19 185 lb (83.9 kg)  04/14/19 183 lb (83 kg)     GEN:  Well nourished, well developed in no acute distress HEENT: Normal NECK: No JVD; No carotid bruits LYMPHATICS: No lymphadenopathy CARDIAC: RRR, no murmurs, no rubs, no gallops RESPIRATORY:  Clear to auscultation without rales, wheezing or rhonchi  ABDOMEN: Soft, non-tender, non-distended MUSCULOSKELETAL:  No edema; No deformity  SKIN: Warm and dry LOWER EXTREMITIES: no swelling NEUROLOGIC:  Alert and oriented x 3 PSYCHIATRIC:  Normal affect   ASSESSMENT:    1. Supraventricular tachycardia (HCC)   2. Palpitations    PLAN:    In order of problems listed above:  1. SVT denies having any. 2. Palpitations denies having any.  How she doing 3. Shakiness and nervousness I suspect this is the effect of beta-blocker withdrawal.  We will put him back on half of the previous dose.   Medication Adjustments/Labs and Tests Ordered: Current medicines are reviewed at length with the patient today.  Concerns regarding medicines are outlined above.  No orders of the defined types were placed in this encounter.  Medication changes: No orders of the defined types were placed in this encounter.   Signed, Alexander Liter, MD, [facility] 04/17/2019 8:44 AM    [place]

## 2019-04-17 NOTE — Patient Instructions (Signed)
Medication Instructions:  Your physician recommends that you continue on your current medications as directed. Please refer to the Current Medication list given to you today.  If you need a refill on your cardiac medications before your next appointment, please call your pharmacy.   Lab work: None.  If you have labs (blood work) drawn today and your tests are completely normal, you will receive your results only by: Marland Kitchen MyChart Message (if you have MyChart) OR . A paper copy in the mail If you have any lab test that is abnormal or we need to change your treatment, we will call you to review the results.  Testing/Procedures: None.  Follow-Up: At [facility], you and your health needs are our priority.  As part of our continuing mission to provide you with exceptional heart care, we have created designated Provider Care Teams.  These Care Teams include your primary Cardiologist (physician) and Advanced Practice Providers (APPs -  Physician Assistants and Nurse Practitioners) who all work together to provide you with the care you need, when you need it. You will need a follow up appointment in 1 weeks.  Please call our office 2 months in advance to schedule this appointment.  You may see No primary care provider on file. or another member of our [employer] Provider Team in [place]: Shirlee More, MD . Jyl Heinz, MD  Any Other Special Instructions Will Be Listed Below (If Applicable).

## 2019-04-24 ENCOUNTER — Telehealth: Payer: Medicare Other | Admitting: Cardiology

## 2019-04-29 ENCOUNTER — Telehealth: Payer: Medicare Other | Admitting: Cardiology

## 2019-05-06 DIAGNOSIS — Z6826 Body mass index (BMI) 26.0-26.9, adult: Secondary | ICD-10-CM | POA: Diagnosis not present

## 2019-05-06 DIAGNOSIS — R109 Unspecified abdominal pain: Secondary | ICD-10-CM | POA: Diagnosis not present

## 2019-05-11 ENCOUNTER — Other Ambulatory Visit: Payer: Self-pay

## 2019-05-11 ENCOUNTER — Ambulatory Visit (INDEPENDENT_AMBULATORY_CARE_PROVIDER_SITE_OTHER): Payer: Medicare Other

## 2019-05-11 DIAGNOSIS — R002 Palpitations: Secondary | ICD-10-CM

## 2019-05-11 DIAGNOSIS — E785 Hyperlipidemia, unspecified: Secondary | ICD-10-CM | POA: Diagnosis not present

## 2019-05-11 DIAGNOSIS — I471 Supraventricular tachycardia: Secondary | ICD-10-CM | POA: Diagnosis not present

## 2019-05-11 NOTE — Progress Notes (Signed)
2D Echocardiogram performed   05/11/19 Cardell Peach RDCS,RVT

## 2019-05-12 DIAGNOSIS — I471 Supraventricular tachycardia: Secondary | ICD-10-CM | POA: Diagnosis not present

## 2019-05-12 DIAGNOSIS — I4891 Unspecified atrial fibrillation: Secondary | ICD-10-CM | POA: Diagnosis not present

## 2019-05-12 DIAGNOSIS — E782 Mixed hyperlipidemia: Secondary | ICD-10-CM | POA: Diagnosis not present

## 2019-05-12 DIAGNOSIS — R911 Solitary pulmonary nodule: Secondary | ICD-10-CM | POA: Diagnosis not present

## 2019-05-13 ENCOUNTER — Other Ambulatory Visit: Payer: Self-pay

## 2019-05-13 ENCOUNTER — Encounter: Payer: Self-pay | Admitting: Cardiology

## 2019-05-13 ENCOUNTER — Ambulatory Visit (INDEPENDENT_AMBULATORY_CARE_PROVIDER_SITE_OTHER): Payer: Medicare Other | Admitting: Cardiology

## 2019-05-13 VITALS — BP 140/68 | HR 51 | Ht 70.0 in | Wt 185.6 lb

## 2019-05-13 DIAGNOSIS — R002 Palpitations: Secondary | ICD-10-CM

## 2019-05-13 DIAGNOSIS — E785 Hyperlipidemia, unspecified: Secondary | ICD-10-CM | POA: Diagnosis not present

## 2019-05-13 DIAGNOSIS — I471 Supraventricular tachycardia: Secondary | ICD-10-CM

## 2019-05-13 DIAGNOSIS — R0789 Other chest pain: Secondary | ICD-10-CM | POA: Diagnosis not present

## 2019-05-13 NOTE — Progress Notes (Signed)
Cardiology Office Note:    Date:  05/13/2019   ID:  Alexander Bowen, DOB 10-Mar-1951, MRN [id number]  PCP:  Maryella Shivers, MD  Cardiologist:  Jenne Campus, MD    Referring MD: Maryella Shivers, MD   Chief Complaint  Patient presents with  . Follow up Echo  Still have some palpitations  History of Present Illness:    Alexander Bowen is a 69 y.o. male palpitations, dyslipidemia, SVT, atypical chest pain.  We been trying to manage his palpitations with limited success beta-blocker was not sufficient to control it therefore I switch him to flecainide however he did end up having problems with beta-blocker withdrawal in spite of the fact that the dose was very small I did put him back on small dose of beta-blocker and shakiness that he developed after stopping beta-blocker distant.  Still described to have some episode of palpitations especially in the evening time.  He will feel his heart flip flopping.  There is no sustained arrhythmias.  He did have echocardiogram done which showed preserved left ventricular ejection fraction.  I think we reached the point at that EP expertise will be beneficial.  I will ask him to wear monitor for about 2 weeks to see exactly what kind of palpitations with dealing with and he will be referred to EP team.  Past Medical History:  Diagnosis Date  . Dysrhythmia 2001   palpitations -checked out by Cardiologist  . GERD (gastroesophageal reflux disease)   . PONV (postoperative nausea and vomiting)     Past Surgical History:  Procedure Laterality Date  . APPENDECTOMY    . LITHOTRIPSY     x 2  . ORIF PATELLA Left 08/05/2015   Procedure: OPEN REDUCTION INTERNAL (ORIF) LEFT  FIXATION PATELLA;  Surgeon: Susa Day, MD;  Location: [facility];  Service: Orthopedics;  Laterality: Left;  . STONE EXTRACTION WITH BASKET     x2  . TONSILLECTOMY      Current Medications: Current Meds  Medication Sig  . aspirin EC 81 MG tablet Take 1 tablet (81 mg total)  by mouth daily.  . flecainide (TAMBOCOR) 50 MG tablet Take 1 tablet (50 mg total) by mouth 2 (two) times daily.  . metoprolol tartrate (LOPRESSOR) 25 MG tablet Take 12.5 mg by mouth 2 (two) times daily.  Marland Kitchen omeprazole (PRILOSEC) 20 MG capsule Take 1 capsule by mouth as needed.  . simvastatin (ZOCOR) 40 MG tablet Take 40 mg by mouth at bedtime.     Allergies:   Patient has no known allergies.   Social History   Socioeconomic History  . Marital status: Married    Spouse name: Not on file  . Number of children: Not on file  . Years of education: Not on file  . Highest education level: Not on file  Occupational History  . Not on file  Social Needs  . Financial resource strain: Not on file  . Food insecurity    Worry: Not on file    Inability: Not on file  . Transportation needs    Medical: Not on file    Non-medical: Not on file  Tobacco Use  . Smoking status: Former Smoker    Packs/day: 1.50    Years: 20.00    Pack years: 30.00    Types: Cigarettes    Quit date: 03/23/1989    Years since quitting: 30.1  . Smokeless tobacco: Never Used  Substance and Sexual Activity  . Alcohol use: Yes    Comment:  occassionally  . Drug use: No  . Sexual activity: Not on file  Lifestyle  . Physical activity    Days per week: Not on file    Minutes per session: Not on file  . Stress: Not on file  Relationships  . Social Herbalist on phone: Not on file    Gets together: Not on file    Attends religious service: Not on file    Active member of club or organization: Not on file    Attends meetings of clubs or organizations: Not on file    Relationship status: Not on file  Other Topics Concern  . Not on file  Social History Narrative  . Not on file     Family History: The patient's family history includes Diabetes in his father; Heart disease in his mother; Heart failure in his mother. ROS:   Please see the history of present illness.    All 14 point review of systems  negative except as described per history of present illness  EKGs/Labs/Other Studies Reviewed:      Recent Labs: 06/30/2018: BUN 15; Creatinine, Ser 1.17; Potassium 5.0; Sodium 140  Recent Lipid Panel No results found for: CHOL, TRIG, HDL, CHOLHDL, VLDL, LDLCALC, LDLDIRECT  Physical Exam:    VS:  BP 140/68   Pulse (!) 51   Ht 5\' 10"  (1.778 m)   Wt 185 lb 9.6 oz (84.2 kg)   SpO2 98%   BMI 26.63 kg/m     Wt Readings from Last 3 Encounters:  05/13/19 185 lb 9.6 oz (84.2 kg)  04/17/19 184 lb (83.5 kg)  04/15/19 185 lb (83.9 kg)     GEN:  Well nourished, well developed in no acute distress HEENT: Normal NECK: No JVD; No carotid bruits LYMPHATICS: No lymphadenopathy CARDIAC: RRR, no murmurs, no rubs, no gallops RESPIRATORY:  Clear to auscultation without rales, wheezing or rhonchi  ABDOMEN: Soft, non-tender, non-distended MUSCULOSKELETAL:  No edema; No deformity  SKIN: Warm and dry LOWER EXTREMITIES: no swelling NEUROLOGIC:  Alert and oriented x 3 PSYCHIATRIC:  Normal affect   ASSESSMENT:    1. Palpitations   2. Dyslipidemia   3. Atypical chest pain   4. Supraventricular tachycardia ([place])    PLAN:    In order of problems listed above:  1. Palpitations plan as outlined above we will discontinue beta-blocker to see if that make him feel any better.  We will continue with flecainide.  No dizziness no passing out 2. Dyslipidemia on simvastatin which I will continue 3. Atypical chest pain denies having any 4. History of SVT no sustained arrhythmias.  Monitor will be placed   Medication Adjustments/Labs and Tests Ordered: Current medicines are reviewed at length with the patient today.  Concerns regarding medicines are outlined above.  No orders of the defined types were placed in this encounter.  Medication changes: No orders of the defined types were placed in this encounter.   Signed, Park Liter, MD, [facility] 05/13/2019 9:48 AM    [place]

## 2019-05-13 NOTE — Addendum Note (Signed)
Addended by: Ashok Norris on: 05/13/2019 09:59 AM   Modules accepted: Orders

## 2019-05-13 NOTE — Patient Instructions (Signed)
Medication Instructions:  Your physician recommends that you continue on your current medications as directed. Please refer to the Current Medication list given to you today.  If you need a refill on your cardiac medications before your next appointment, please call your pharmacy.   Lab work: None.  If you have labs (blood work) drawn today and your tests are completely normal, you will receive your results only by: Marland Kitchen MyChart Message (if you have MyChart) OR . A paper copy in the mail If you have any lab test that is abnormal or we need to change your treatment, we will call you to review the results.  Testing/Procedures: Your physician has recommended that you wear a holter monitor. Holter monitors are medical devices that record the heart's electrical activity. Doctors most often use these monitors to diagnose arrhythmias. Arrhythmias are problems with the speed or rhythm of the heartbeat. The monitor is a small, portable device. You can wear one while you do your normal daily activities. This is usually used to diagnose what is causing palpitations/syncope (passing out). Wear for 14 days.     Follow-Up: At [facility], you and your health needs are our priority.  As part of our continuing mission to provide you with exceptional heart care, we have created designated Provider Care Teams.  These Care Teams include your primary Cardiologist (physician) and Advanced Practice Providers (APPs -  Physician Assistants and Nurse Practitioners) who all work together to provide you with the care you need, when you need it. You will need a follow up appointment in 3 months.  Please call our office 2 months in advance to schedule this appointment.  You may see No primary care provider on file. or another member of our [employer] Provider Team in [place]: Shirlee More, MD . Jyl Heinz, MD  Any Other Special Instructions Will Be Listed Below (If Applicable).  Dr. Agustin Cree has referred you  to see electrophysiology, they should call you within one week for and appointment. Please call our office if you haven't heard from them in 1 week.

## 2019-05-19 ENCOUNTER — Other Ambulatory Visit (INDEPENDENT_AMBULATORY_CARE_PROVIDER_SITE_OTHER): Payer: Medicare Other

## 2019-05-19 ENCOUNTER — Telehealth: Payer: Medicare Other | Admitting: Cardiology

## 2019-05-19 DIAGNOSIS — R002 Palpitations: Secondary | ICD-10-CM

## 2019-05-19 DIAGNOSIS — R0789 Other chest pain: Secondary | ICD-10-CM | POA: Diagnosis not present

## 2019-06-05 DIAGNOSIS — R002 Palpitations: Secondary | ICD-10-CM | POA: Diagnosis not present

## 2019-06-12 DIAGNOSIS — I471 Supraventricular tachycardia: Secondary | ICD-10-CM | POA: Diagnosis not present

## 2019-06-12 DIAGNOSIS — I4891 Unspecified atrial fibrillation: Secondary | ICD-10-CM | POA: Diagnosis not present

## 2019-06-12 DIAGNOSIS — E782 Mixed hyperlipidemia: Secondary | ICD-10-CM | POA: Diagnosis not present

## 2019-06-12 DIAGNOSIS — R911 Solitary pulmonary nodule: Secondary | ICD-10-CM | POA: Diagnosis not present

## 2019-06-23 ENCOUNTER — Telehealth: Payer: Self-pay | Admitting: Emergency Medicine

## 2019-06-23 NOTE — Telephone Encounter (Signed)
Left message for patient to return call.

## 2019-06-29 ENCOUNTER — Encounter: Payer: Self-pay | Admitting: Cardiology

## 2019-06-29 ENCOUNTER — Ambulatory Visit (INDEPENDENT_AMBULATORY_CARE_PROVIDER_SITE_OTHER): Payer: Medicare Other | Admitting: Cardiology

## 2019-06-29 ENCOUNTER — Other Ambulatory Visit: Payer: Self-pay

## 2019-06-29 VITALS — BP 128/68 | HR 47 | Ht 70.0 in | Wt 188.6 lb

## 2019-06-29 DIAGNOSIS — I493 Ventricular premature depolarization: Secondary | ICD-10-CM

## 2019-06-29 MED ORDER — PROPAFENONE HCL ER 225 MG PO CP12: 225.0000 mg | ORAL_CAPSULE | Freq: Two times a day (BID) | ORAL | 3 refills | Status: DC

## 2019-06-29 NOTE — Progress Notes (Signed)
Electrophysiology Office Note   Date:  06/29/2019   ID:  [place], Mullett 05-05-1951, MRN [id number]  PCP:  Maryella Shivers, MD  Cardiologist: Jenne Campus Primary Electrophysiologist:  London Nonaka Meredith Leeds, MD    No chief complaint on file.    History of Present Illness: Alexander Bowen is a 68 y.o. male who is being seen today for the evaluation of SVT, PVCs at the request of Park Liter, MD. Presenting today for electrophysiology evaluation.  He has a history significant for hyperlipidemia, SVT, atypical chest pain.  He also has 4% PVCs as noted on cardiac monitoring.  He was put on flecainide, but he had problems with beta-blocker withdrawal.  His palpitations mainly occur in the evenings.  He has had no sustained arrhythmias.  Today, he denies symptoms of chest pain, shortness of breath, orthopnea, PND, lower extremity edema, claudication, dizziness, presyncope, syncope, bleeding, or neurologic sequela. The patient is tolerating medications without difficulties.  Since starting flecainide, he has felt cloudy.  Otherwise he is done well.  He is continued to have palpitations.  Does not appear that flecainide has changed his symptoms.   Past Medical History:  Diagnosis Date  . Dysrhythmia 2001   palpitations -checked out by Cardiologist  . GERD (gastroesophageal reflux disease)   . PONV (postoperative nausea and vomiting)    Past Surgical History:  Procedure Laterality Date  . APPENDECTOMY    . LITHOTRIPSY     x 2  . ORIF PATELLA Left 08/05/2015   Procedure: OPEN REDUCTION INTERNAL (ORIF) LEFT  FIXATION PATELLA;  Surgeon: Susa Day, MD;  Location: [facility];  Service: Orthopedics;  Laterality: Left;  . STONE EXTRACTION WITH BASKET     x2  . TONSILLECTOMY       Current Outpatient Medications  Medication Sig Dispense Refill  . aspirin EC 81 MG tablet Take 1 tablet (81 mg total) by mouth daily. 90 tablet 3  . flecainide (TAMBOCOR) 50 MG tablet Take 1  tablet (50 mg total) by mouth 2 (two) times daily. 180 tablet 1  . metoprolol tartrate (LOPRESSOR) 25 MG tablet Take 12.5 mg by mouth 2 (two) times daily.    Marland Kitchen omeprazole (PRILOSEC) 20 MG capsule Take 1 capsule by mouth as needed.    . simvastatin (ZOCOR) 40 MG tablet Take 40 mg by mouth at bedtime.     No current facility-administered medications for this visit.     Allergies:   Patient has no known allergies.   Social History:  The patient  reports that he quit smoking about 30 years ago. His smoking use included cigarettes. He has a 30.00 pack-year smoking history. He has never used smokeless tobacco. He reports current alcohol use. He reports that he does not use drugs.   Family History:  The patient's family history includes Diabetes in his father; Heart disease in his mother; Heart failure in his mother.    ROS:  Please see the history of present illness.   Otherwise, review of systems is positive for none.   All other systems are reviewed and negative.    PHYSICAL EXAM: VS:  BP 128/68   Pulse (!) 47   Ht 5\' 10"  (1.778 m)   Wt 188 lb 9.6 oz (85.5 kg)   SpO2 96%   BMI 27.06 kg/m  , BMI Body mass index is 27.06 kg/m. GEN: Well nourished, well developed, in no acute distress  HEENT: normal  Neck: no JVD, carotid bruits, or masses Cardiac:  RRR; no murmurs, rubs, or gallops,no edema  Respiratory:  clear to auscultation bilaterally, normal work of breathing GI: soft, nontender, nondistended, + BS MS: no deformity or atrophy  Skin: warm and dry Neuro:  Strength and sensation are intact Psych: euthymic mood, full affect  EKG:  EKG is not ordered today. Personal review of the ekg ordered 04/17/19 shows sinus rhythm, PVC  Recent Labs: 06/30/2018: BUN 15; Creatinine, Ser 1.17; Potassium 5.0; Sodium 140    Lipid Panel  No results found for: CHOL, TRIG, HDL, CHOLHDL, VLDL, LDLCALC, LDLDIRECT   Wt Readings from Last 3 Encounters:  06/29/19 188 lb 9.6 oz (85.5 kg)  05/13/19 185  lb 9.6 oz (84.2 kg)  04/17/19 184 lb (83.5 kg)      Other studies Reviewed: Additional studies/ records that were reviewed today include: TTE 05/11/19  Review of the above records today demonstrates:   1. The left ventricle has normal systolic function with an ejection fraction of 60-65%. The cavity size was normal. Left ventricular diastolic parameters were normal.  2. The right ventricle has normal systolic function. The cavity was normal. There is no increase in right ventricular wall thickness.  Cardiac monitor 06/21/19 personally reviewee 4.8% PVCs Narrow complex tachycardia present.  ASSESSMENT AND PLAN:  1.  PVCs: 4.8% noted on cardiac monitor.  Unfortunately this is a quite low number and thus ablation would be less amenable.  Does not appear that he has coronary disease.  He is currently on flecainide.  He is having some symptoms while on the flecainide cloudy thoughts.  Due to that, we Athalie Newhard stop flecainide and start him on propafenone.  We Shelton Soler continue metoprolol at the current dose.    Current medicines are reviewed at length with the patient today.   The patient does not have concerns regarding his medicines.  The following changes were made today: Stop flecainide, start propafenone 225 mg twice daily  Labs/ tests ordered today include:  No orders of the defined types were placed in this encounter.  Case discussed with referring cardiologist  Disposition:   FU with Zoeie Ritter 3 months  Signed, Broox Lonigro Meredith Leeds, MD  06/29/2019 12:29 PM     [facility] [address] [place] [address] [phone] (office) [phone] (fax)

## 2019-06-29 NOTE — Patient Instructions (Addendum)
Medication Instructions:  Your physician has recommended you make the following change in your medication:  1. STOP Flecainide 2. START Propafenone 225 mg twice daily  * If you need a refill on your cardiac medications before your next appointment, please call your pharmacy.   Labwork: None ordered  Testing/Procedures: None ordered  Follow-Up: Your physician recommends that you schedule a follow-up appointment in: 3 months with Dr. Curt Bears.  Thank you for choosing [facility]!!   Trinidad Curet, RN [phone]  Any Other Special Instructions Will Be Listed Below (If Applicable).   Propafenone tablets What is this medicine? PROPAFENONE (proe pa FEEN one) is an antiarrhythmic agent. It is used to treat irregular heart rhythm and can slow rapid heartbeats. This medicine can help your heart to return to and maintain a normal rhythm. This medicine may be used for other purposes; ask your health care provider or pharmacist if you have questions. COMMON BRAND NAME(S): Rythmol What should I tell my health care provider before I take this medicine? They need to know if you have any of these conditions:  heart disease  high blood levels of potassium  kidney disease  liver disease  low blood pressure  lung disease like asthma, chronic bronchitis or emphysema  pacemaker  slow heart rate  an unusual or allergic reaction to propafenone, other medicines, foods, dyes, or preservatives  pregnant or trying to get pregnant  breast-feeding How should I use this medicine? Take this medicine by mouth with a glass of water. Follow the directions on the prescription label. You can take this medicine with or without food. Take your doses at regular intervals. Do not take your medicine more often than directed. Do not stop taking except on the advice of your doctor or health care professional. Talk to your pediatrician regarding the use of this medicine in children. Special care may  be needed. Overdosage: If you think you have taken too much of this medicine contact a poison control center or emergency room at once. NOTE: This medicine is only for you. Do not share this medicine with others. What if I miss a dose? If you miss a dose, take it as soon as you can. If it is almost time for your next dose, take only that dose. Do not take double or extra doses. What may interact with this medicine? Do not take this medicine with any of the following medications:  arsenic trioxide  certain antibiotics like clarithromycin, erythromycin, grepafloxacin, pentamidine, sparfloxacin, troleandomycin  certain medicines for depression or mental illness like amoxapine, haloperidol, maprotiline, pimozide, sertindole, thioridazine, tricyclic antidepressants  certain medicines for fungal infections like fluconazole, itraconazole, ketoconazole, posaconazole, voriconazole  certain medicines for irregular heart beat like dronedarone  certain medicines for malaria like chloroquine, halofantrine  cisapride  droperidol  levomethadyl  ranolazine This medicine may also interact with the following medications:  certain medicines for angina or blood pressure  certain medicines for asthma or breathing difficulties like formoterol, salmeterol  certain medicines that treat or prevent blood clots like warfarin  cimetidine  cyclosporine  digoxin  diuretics  local anesthetics  other medicines that prolong the QT interval (cause an abnormal heart rhythm) like dofetilide, ziprasidone  rifampin  ritonavir  theophylline This list may not describe all possible interactions. Give your health care provider a list of all the medicines, herbs, non-prescription drugs, or dietary supplements you use. Also tell them if you smoke, drink alcohol, or use illegal drugs. Some items may interact with your medicine.  What should I watch for while using this medicine? Your condition will be  monitored closely when you first begin therapy. Often, this drug is first started in a hospital or other monitored health care setting. Once you are on maintenance therapy, visit your doctor or health care professional for regular checks on your progress. Because your condition and use of this medicine carry some risk, it is a good idea to carry an identification card, necklace or bracelet with details of your condition, medications, and doctor or health care professional. Dennis Bast may get drowsy or dizzy. Do not drive, use machinery, or do anything that needs mental alertness until you know how this medicine affects you. Do not stand or sit up quickly, especially if you are an older patient. This reduces the risk of dizzy or fainting spells. If you are going to have surgery, tell your doctor or health care professional that you are taking this medicine. What side effects may I notice from receiving this medicine? Side effects that you should report to your doctor or health care professional as soon as possible:  chest pain, palpitations  fever or chills  shortness of breath  swelling of feet or legs  trembling or shaking Side effects that usually do not require medical attention (report to your doctor or health care professional if they continue or are bothersome):  blurred vision  changes in taste (a metallic or bitter taste)  constipation or diarrhea  dry mouth  headache  nausea or vomiting  tiredness or weakness This list may not describe all possible side effects. Call your doctor for medical advice about side effects. You may report side effects to FDA at 1-800-FDA-1088. Where should I keep my medicine? Keep out of the reach of children. Store at room temperature between 15 and 30 degrees C (59 and 86 degrees F). Protect from light. Keep container tightly closed. Throw away any unused medicine after the expiration date. NOTE: This sheet is a summary. It may not cover all possible  information. If you have questions about this medicine, talk to your doctor, pharmacist, or health care provider.  2020 Elsevier/Gold Standard (2018-10-21 08:51:33)

## 2019-07-13 DIAGNOSIS — I471 Supraventricular tachycardia: Secondary | ICD-10-CM | POA: Diagnosis not present

## 2019-07-13 DIAGNOSIS — E782 Mixed hyperlipidemia: Secondary | ICD-10-CM | POA: Diagnosis not present

## 2019-07-13 DIAGNOSIS — I4891 Unspecified atrial fibrillation: Secondary | ICD-10-CM | POA: Diagnosis not present

## 2019-08-12 DIAGNOSIS — R911 Solitary pulmonary nodule: Secondary | ICD-10-CM | POA: Diagnosis not present

## 2019-08-12 DIAGNOSIS — I471 Supraventricular tachycardia: Secondary | ICD-10-CM | POA: Diagnosis not present

## 2019-08-12 DIAGNOSIS — I4891 Unspecified atrial fibrillation: Secondary | ICD-10-CM | POA: Diagnosis not present

## 2019-08-12 DIAGNOSIS — E782 Mixed hyperlipidemia: Secondary | ICD-10-CM | POA: Diagnosis not present

## 2019-08-13 ENCOUNTER — Other Ambulatory Visit: Payer: Self-pay

## 2019-08-13 ENCOUNTER — Ambulatory Visit (INDEPENDENT_AMBULATORY_CARE_PROVIDER_SITE_OTHER): Payer: Medicare Other | Admitting: Cardiology

## 2019-08-13 ENCOUNTER — Encounter: Payer: Self-pay | Admitting: Cardiology

## 2019-08-13 VITALS — BP 140/72 | HR 50 | Wt 190.2 lb

## 2019-08-13 DIAGNOSIS — R002 Palpitations: Secondary | ICD-10-CM

## 2019-08-13 DIAGNOSIS — R0789 Other chest pain: Secondary | ICD-10-CM

## 2019-08-13 DIAGNOSIS — I471 Supraventricular tachycardia: Secondary | ICD-10-CM | POA: Diagnosis not present

## 2019-08-13 NOTE — Addendum Note (Signed)
Addended by: Linna Hoff R on: 08/13/2019 12:00 PM   Modules accepted: Orders

## 2019-08-13 NOTE — Progress Notes (Signed)
Cardiology Office Note:    Date:  08/13/2019   ID:  Alexander Bowen, DOB 09-21-1951, MRN [id number]  PCP:  Maryella Shivers, MD  Cardiologist:  Jenne Campus, MD    Referring MD: Maryella Shivers, MD   Chief Complaint  Patient presents with  . Follow-up  Doing well now  History of Present Illness:    Alexander Bowen is a 68 y.o. male with palpitations, frequent ventricle ectopy which is symptomatic.  We tried to put him on right medication to suppress this arrhythmia but so far we failing trial of flecainide was done but he started having some side effects, now recently propafenone was started however he clearly could not tolerate this medication was weak tired exhausted could not do anything.  Also started complain of having some atypical chest pain tightness like that typically happen at rest.  At the same time he is able to walk and climb stairs with no major difficulties somewhat tired when he does have been more tired than before but overall seems to be doing well.  He stopped propafenone that he use only metoprolol 37.5 twice daily with good effect.  Past Medical History:  Diagnosis Date  . Dysrhythmia 2001   palpitations -checked out by Cardiologist  . GERD (gastroesophageal reflux disease)   . PONV (postoperative nausea and vomiting)     Past Surgical History:  Procedure Laterality Date  . APPENDECTOMY    . LITHOTRIPSY     x 2  . ORIF PATELLA Left 08/05/2015   Procedure: OPEN REDUCTION INTERNAL (ORIF) LEFT  FIXATION PATELLA;  Surgeon: Susa Day, MD;  Location: [facility];  Service: Orthopedics;  Laterality: Left;  . STONE EXTRACTION WITH BASKET     x2  . TONSILLECTOMY      Current Medications: Current Meds  Medication Sig  . aspirin EC 81 MG tablet Take 1 tablet (81 mg total) by mouth daily.  . metoprolol tartrate (LOPRESSOR) 25 MG tablet Take 12.5 mg by mouth 2 (two) times daily.  Marland Kitchen omeprazole (PRILOSEC) 20 MG capsule Take 1 capsule by mouth as needed.  .  simvastatin (ZOCOR) 40 MG tablet Take 40 mg by mouth at bedtime.     Allergies:   Patient has no known allergies.   Social History   Socioeconomic History  . Marital status: Married    Spouse name: Not on file  . Number of children: Not on file  . Years of education: Not on file  . Highest education level: Not on file  Occupational History  . Not on file  Social Needs  . Financial resource strain: Not on file  . Food insecurity    Worry: Not on file    Inability: Not on file  . Transportation needs    Medical: Not on file    Non-medical: Not on file  Tobacco Use  . Smoking status: Former Smoker    Packs/day: 1.50    Years: 20.00    Pack years: 30.00    Types: Cigarettes    Quit date: 03/23/1989    Years since quitting: 30.4  . Smokeless tobacco: Never Used  Substance and Sexual Activity  . Alcohol use: Yes    Comment: occassionally  . Drug use: No  . Sexual activity: Not on file  Lifestyle  . Physical activity    Days per week: Not on file    Minutes per session: Not on file  . Stress: Not on file  Relationships  . Social connections  Talks on phone: Not on file    Gets together: Not on file    Attends religious service: Not on file    Active member of club or organization: Not on file    Attends meetings of clubs or organizations: Not on file    Relationship status: Not on file  Other Topics Concern  . Not on file  Social History Narrative  . Not on file     Family History: The patient's family history includes Diabetes in his father; Heart disease in his mother; Heart failure in his mother. ROS:   Please see the history of present illness.    All 14 point review of systems negative except as described per history of present illness  EKGs/Labs/Other Studies Reviewed:      Recent Labs: No results found for requested labs within last 8760 hours.  Recent Lipid Panel No results found for: CHOL, TRIG, HDL, CHOLHDL, VLDL, LDLCALC, LDLDIRECT  Physical  Exam:    VS:  BP 140/72   Pulse (!) 50   Wt 190 lb 3.2 oz (86.3 kg)   SpO2 98%   BMI 27.29 kg/m     Wt Readings from Last 3 Encounters:  08/13/19 190 lb 3.2 oz (86.3 kg)  06/29/19 188 lb 9.6 oz (85.5 kg)  05/13/19 185 lb 9.6 oz (84.2 kg)     GEN:  Well nourished, well developed in no acute distress HEENT: Normal NECK: No JVD; No carotid bruits LYMPHATICS: No lymphadenopathy CARDIAC: RRR, no murmurs, no rubs, no gallops RESPIRATORY:  Clear to auscultation without rales, wheezing or rhonchi  ABDOMEN: Soft, non-tender, non-distended MUSCULOSKELETAL:  No edema; No deformity  SKIN: Warm and dry LOWER EXTREMITIES: no swelling NEUROLOGIC:  Alert and oriented x 3 PSYCHIATRIC:  Normal affect   ASSESSMENT:    1. Supraventricular tachycardia (HCC)   2. Palpitations   3. Atypical chest pain    PLAN:    In order of problems listed above:  1. Supraventricular tachycardia continue beta-blocker. 2. Palpitations most likely PVCs.  Only 4% burden not enough to strongly consider ablation however he felt to antiarrhythmic therapy now only on beta-blocker seems to be happy the way he feels.  I will continue with that.  Will forward message to our EP team about the situation. 3. Atypical chest pain I will schedule him to have Lexiscan make sure he does have no inducible skin   Medication Adjustments/Labs and Tests Ordered: Current medicines are reviewed at length with the patient today.  Concerns regarding medicines are outlined above.  No orders of the defined types were placed in this encounter.  Medication changes: No orders of the defined types were placed in this encounter.   Signed, Park Liter, MD, [facility] 08/13/2019 11:49 AM    [place]

## 2019-08-13 NOTE — Patient Instructions (Signed)
Medication Instructions:  Your physician recommends that you continue on your current medications as directed. Please refer to the Current Medication list given to you today.  If you need a refill on your cardiac medications before your next appointment, please call your pharmacy.   Lab work: None.  If you have labs (blood work) drawn today and your tests are completely normal, you will receive your results only by: . MyChart Message (if you have MyChart) OR . A paper copy in the mail If you have any lab test that is abnormal or we need to change your treatment, we will call you to review the results.  Testing/Procedures: Your physician has requested that you have a lexiscan myoview. For further information please visit [email or web address]. Please follow instruction sheet, as given.    Follow-Up: At [facility], you and your health needs are our priority.  As part of our continuing mission to provide you with exceptional heart care, we have created designated Provider Care Teams.  These Care Teams include your primary Cardiologist (physician) and Advanced Practice Providers (APPs -  Physician Assistants and Nurse Practitioners) who all work together to provide you with the care you need, when you need it. You will need a follow up appointment in 6 weeks.  Please call our office 2 months in advance to schedule this appointment.  You may see No primary care provider on file. or another member of our [facility] Provider Team in [place]: Brian Munley, MD . Rajan Revankar, MD  Any Other Special Instructions Will Be Listed Below (If Applicable).   Cardiac Nuclear Scan A cardiac nuclear scan is a test that measures blood flow to the heart when a person is resting and when he or she is exercising. The test looks for problems such as:  Not enough blood reaching a portion of the heart.  The heart muscle not working normally. You may need this test if:  You have heart disease.  You  have had abnormal lab results.  You have had heart surgery or a balloon procedure to open up blocked arteries (angioplasty).  You have chest pain.  You have shortness of breath. In this test, a radioactive dye (tracer) is injected into your bloodstream. After the tracer has traveled to your heart, an imaging device is used to measure how much of the tracer is absorbed by or distributed to various areas of your heart. This procedure is usually done at a hospital and takes 2-4 hours. Tell a health care provider about:  Any allergies you have.  All medicines you are taking, including vitamins, herbs, eye drops, creams, and over-the-counter medicines.  Any problems you or family members have had with anesthetic medicines.  Any blood disorders you have.  Any surgeries you have had.  Any medical conditions you have.  Whether you are pregnant or may be pregnant. What are the risks? Generally, this is a safe procedure. However, problems may occur, including:  Serious chest pain and heart attack. This is only a risk if the stress portion of the test is done.  Rapid heartbeat.  Sensation of warmth in your chest. This usually passes quickly.  Allergic reaction to the tracer. What happens before the procedure?  Ask your health care provider about changing or stopping your regular medicines. This is especially important if you are taking diabetes medicines or blood thinners.  Follow instructions from your health care provider about eating or drinking restrictions.  Remove your jewelry on the day of the procedure.   What happens during the procedure?  An IV will be inserted into one of your veins.  Your health care provider will inject a small amount of radioactive tracer through the IV.  You will wait for 20-40 minutes while the tracer travels through your bloodstream.  Your heart activity will be monitored with an electrocardiogram (ECG).  You will lie down on an exam table.   Images of your heart will be taken for about 15-20 minutes.  You may also have a stress test. For this test, one of the following may be done: ? You will exercise on a treadmill or stationary bike. While you exercise, your heart's activity will be monitored with an ECG, and your blood pressure will be checked. ? You will be given medicines that will increase blood flow to parts of your heart. This is done if you are unable to exercise.  When blood flow to your heart has peaked, a tracer will again be injected through the IV.  After 20-40 minutes, you will get back on the exam table and have more images taken of your heart.  Depending on the type of tracer used, scans may need to be repeated 3-4 hours later.  Your IV line will be removed when the procedure is over. The procedure may vary among health care providers and hospitals. What happens after the procedure?  Unless your health care provider tells you otherwise, you may return to your normal schedule, including diet, activities, and medicines.  Unless your health care provider tells you otherwise, you may increase your fluid intake. This will help to flush the contrast dye from your body. Drink enough fluid to keep your urine pale yellow.  Ask your health care provider, or the department that is doing the test: ? When will my results be ready? ? How will I get my results? Summary  A cardiac nuclear scan measures the blood flow to the heart when a person is resting and when he or she is exercising.  Tell your health care provider if you are pregnant.  Before the procedure, ask your health care provider about changing or stopping your regular medicines. This is especially important if you are taking diabetes medicines or blood thinners.  After the procedure, unless your health care provider tells you otherwise, increase your fluid intake. This will help flush the contrast dye from your body.  After the procedure, unless your health  care provider tells you otherwise, you may return to your normal schedule, including diet, activities, and medicines. This information is not intended to replace advice given to you by your health care provider. Make sure you discuss any questions you have with your health care provider. Document Released: 11/23/2004 Document Revised: 04/14/2018 Document Reviewed: 04/14/2018 Elsevier Patient Education  2020 [employer]    

## 2019-08-24 DIAGNOSIS — F0390 Unspecified dementia without behavioral disturbance: Secondary | ICD-10-CM

## 2019-08-24 HISTORY — DX: Unspecified dementia, unspecified severity, without behavioral disturbance, psychotic disturbance, mood disturbance, and anxiety: F03.90

## 2019-08-26 MED ORDER — MEXILETINE HCL 150 MG PO CAPS: 150.0000 mg | ORAL_CAPSULE | Freq: Three times a day (TID) | ORAL | 3 refills | Status: DC

## 2019-08-26 MED ORDER — METOPROLOL TARTRATE 25 MG PO TABS: 12.5000 mg | ORAL_TABLET | Freq: Two times a day (BID) | ORAL | 0 refills | Status: DC

## 2019-08-31 ENCOUNTER — Telehealth: Payer: Self-pay | Admitting: Cardiology

## 2019-08-31 MED ORDER — MEXILETINE HCL 150 MG PO CAPS: 150.0000 mg | ORAL_CAPSULE | Freq: Two times a day (BID) | ORAL | 3 refills | Status: DC

## 2019-08-31 NOTE — Telephone Encounter (Signed)
Pt currently out of town. Pt reports doing better now and BP normalized.  Reports typically his BP is WNL. States that he feels palpitations mostly at night when he lays down, this is when he feels it. Advised Dr. Curt Bears recommends switching to Carvedilol 12.5 mg BID to see if this helps palpitations/HRs.  Aware that if switches and still experiencing issues we will then discuss increasing Mexiletine dose to 250 mg BID.  But explained that he wants to start with one medication at a time. Pt feeling good, BP normal, no palpitations - pt would like to wait and see how he does this week before making any changes just yet. Pt advised to call me this week/next to discuss how he is doing and if medication change still needed. Patient verbalized understanding and agreeable to plan.

## 2019-08-31 NOTE — Telephone Encounter (Signed)
Called patient he reports his blood pressure was 174/96 this morning. He took 25 mg of metoprolol tartrate this morning and his blood pressure is down to 150/79. He denies head ache or blurred vision. (He normally takes metoprolol tartrate 12.5 mg twice daily but took the whole tablet today to help his blood pressure.) He was really calling to speak with Dr. Curt Bears staff because he was started on mexiletine last Wednesday and it helped the first two days but it isn't working now his palpitations have returned and he also thought this was possibly the cause of his blood pressure increasing. He was only taking mexiletine 150 mg twice daily even though it states 3 times daily. He would like to know from Dr. Curt Bears if he should start taking it 150 mg 3 times daily or what the next recommendations are. Will forward to Dr. Curt Bears and team.

## 2019-08-31 NOTE — Telephone Encounter (Signed)
Pt c/o BP issue: STAT if pt c/o blurred vision, one-sided weakness or slurred speech  1. What are your last 5 BP readings? 174/96  2. Are you having any other symptoms (ex. Dizziness, headache, blurred vision, passed out)? No   3. What is your BP issue? Patient seems to think it's coming from the medication. Please call patient

## 2019-08-31 NOTE — Addendum Note (Signed)
Addended by: Stanton Kidney on: 08/31/2019 05:22 PM   Modules accepted: Orders

## 2019-09-02 ENCOUNTER — Telehealth (HOSPITAL_COMMUNITY): Payer: Self-pay | Admitting: *Deleted

## 2019-09-02 NOTE — Telephone Encounter (Signed)
Left message on voicemail per DPR in reference to upcoming appointment scheduled on 09/08/19 with detailed instructions given per Myocardial Perfusion Study Information Sheet for the test. LM to arrive 15 minutes early, and that it is imperative to arrive on time for appointment to keep from having the test rescheduled. If you need to cancel or reschedule your appointment, please call the office within 24 hours of your appointment. Failure to do so may result in a cancellation of your appointment, and a $50 no show fee. Phone number given for call back for any questions. Onyx Schirmer Jacqueline    

## 2019-09-04 NOTE — Telephone Encounter (Signed)
Pt calls in to report he didn't sleep good last night.  At 2:41 am his BP was 192/100. He took Lopressor 25 mg this morning (he is ordered to take 12.5 mg BID, but took whole tablet this morning d/t elevated BP).  About an hour after taking BP is 163/79. Reports HRs avg mid 50s. Pt thinks he is ready to make medication change to try and get better control of HRs.  Says that he used to take an even higher dose of Lopressor than he is taking now. Camnitz recommended switching to Carvedilol 12.5 mg BID to see if this would help with palpitations/HRs.  Being that he is currently not having issues w/ palpitations but w/ BP, will forward to Dr. Curt Bears for confirmation of switching pt to Carvedilol or does he want to start pt on BP med instead. Pt aware it may be late today before hearing back from me as Dr. Curt Bears is performing procedures today in the hospital. Patient verbalized understanding and agreeable to plan.

## 2019-09-07 MED ORDER — LOSARTAN POTASSIUM 25 MG PO TABS: 25.0000 mg | ORAL_TABLET | Freq: Every day | ORAL | 3 refills | Status: DC

## 2019-09-07 MED ORDER — CARVEDILOL 6.25 MG PO TABS: 6.2500 mg | ORAL_TABLET | Freq: Two times a day (BID) | ORAL | 3 refills | Status: DC

## 2019-09-07 NOTE — Telephone Encounter (Signed)
Followed up with pt. Per Dr. Curt Bears:  Advised to stop Lopressor and start Carvedilol 6.25 mg BID.  Advised to also start Losartan 50 mg once daily. Pt will call the office if issues begin after medication changes and/or BP not improving.. Advised to call by Thursday if BP does not improve with changes. Patient verbalized understanding and agreeable to plan.   Pt also reports "shakiness/trembling".  States he experienced some when taking Propafenone and still having problem on Mexiletine.  Reports that he has been off the Propafenone for about a month now.  Reviewed w/ Dr. Curt Bears.  Pt advised to continue monitoring and to call back if issue persists in next 2-3 weeks. Patient verbalized understanding and agreeable to plan.

## 2019-09-08 ENCOUNTER — Other Ambulatory Visit: Payer: Self-pay

## 2019-09-08 ENCOUNTER — Ambulatory Visit (INDEPENDENT_AMBULATORY_CARE_PROVIDER_SITE_OTHER): Payer: Medicare Other

## 2019-09-08 DIAGNOSIS — R0789 Other chest pain: Secondary | ICD-10-CM

## 2019-09-08 DIAGNOSIS — R002 Palpitations: Secondary | ICD-10-CM

## 2019-09-08 LAB — MYOCARDIAL PERFUSION IMAGING
LV dias vol: 79 mL (ref 62–150)
LV sys vol: 30 mL
Peak HR: 79 {beats}/min
Rest HR: 56 {beats}/min
SDS: 3
SRS: 3
SSS: 6
TID: 1.08

## 2019-09-08 MED ORDER — TECHNETIUM TC 99M TETROFOSMIN IV KIT
10.9000 | PACK | Freq: Once | INTRAVENOUS | Status: AC | PRN
  Administered 2019-09-08: 10.9 via INTRAVENOUS

## 2019-09-08 MED ORDER — TECHNETIUM TC 99M TETROFOSMIN IV KIT
33.0000 | PACK | Freq: Once | INTRAVENOUS | Status: AC | PRN
  Administered 2019-09-08: 33 via INTRAVENOUS

## 2019-09-08 MED ORDER — REGADENOSON 0.4 MG/5ML IV SOLN
0.4000 mg | Freq: Once | INTRAVENOUS | Status: AC
  Administered 2019-09-08: 0.4 mg via INTRAVENOUS

## 2019-09-11 DIAGNOSIS — I471 Supraventricular tachycardia: Secondary | ICD-10-CM | POA: Diagnosis not present

## 2019-09-11 DIAGNOSIS — I4891 Unspecified atrial fibrillation: Secondary | ICD-10-CM | POA: Diagnosis not present

## 2019-09-11 DIAGNOSIS — E782 Mixed hyperlipidemia: Secondary | ICD-10-CM | POA: Diagnosis not present

## 2019-09-15 DIAGNOSIS — E782 Mixed hyperlipidemia: Secondary | ICD-10-CM | POA: Diagnosis not present

## 2019-09-15 DIAGNOSIS — Z125 Encounter for screening for malignant neoplasm of prostate: Secondary | ICD-10-CM | POA: Diagnosis not present

## 2019-09-21 ENCOUNTER — Encounter: Payer: Self-pay | Admitting: Cardiology

## 2019-09-21 ENCOUNTER — Other Ambulatory Visit: Payer: Self-pay

## 2019-09-21 ENCOUNTER — Ambulatory Visit (INDEPENDENT_AMBULATORY_CARE_PROVIDER_SITE_OTHER): Payer: Medicare Other | Admitting: Cardiology

## 2019-09-21 VITALS — BP 146/80 | HR 56 | Ht 70.0 in | Wt 186.0 lb

## 2019-09-21 DIAGNOSIS — R100 Acute abdomen: Secondary | ICD-10-CM | POA: Diagnosis not present

## 2019-09-21 DIAGNOSIS — N3020 Other chronic cystitis without hematuria: Secondary | ICD-10-CM | POA: Diagnosis not present

## 2019-09-21 DIAGNOSIS — I493 Ventricular premature depolarization: Secondary | ICD-10-CM

## 2019-09-21 DIAGNOSIS — N201 Calculus of ureter: Secondary | ICD-10-CM | POA: Diagnosis not present

## 2019-09-21 MED ORDER — LOSARTAN POTASSIUM 50 MG PO TABS: 50.0000 mg | ORAL_TABLET | Freq: Every day | ORAL | 1 refills | Status: DC

## 2019-09-21 NOTE — Progress Notes (Signed)
Electrophysiology Office Note   Date:  09/21/2019   ID:  Alexander Bowen, Alexander Bowen 12-08-1950, MRN [id number]  PCP:  Maryella Shivers, MD  Cardiologist: Jenne Campus Primary Electrophysiologist:  Doyce Saling Meredith Leeds, MD    No chief complaint on file.    History of Present Illness: Alexander Bowen is a 68 y.o. male who is being seen today for the evaluation of SVT, PVCs at the request of Maryella Shivers, MD. Presenting today for electrophysiology evaluation.  He has a history significant for hyperlipidemia, SVT, atypical chest pain.  He also has 4% PVCs as noted on cardiac monitoring.  He was put on flecainide, but he had problems with beta-blocker withdrawal.  His palpitations mainly occur in the evenings.  He has had no sustained arrhythmias.  He has unfortunately not tolerated class I antiarrhythmics.  He has been taken off of lichenoid and propafenone.  He is currently on mexiletine.  Today, denies symptoms of palpitations, chest pain, shortness of breath, orthopnea, PND, lower extremity edema, claudication, dizziness, presyncope, syncope, bleeding, or neurologic sequela. The patient is tolerating medications without difficulties.  Overall he is doing well.  He does note occasional palpitations, mainly towards the end of the evening, or first thing in the morning.  He also notices palpitations when he is laying down to go to sleep.  He is currently comfortable with his symptomatology.   Past Medical History:  Diagnosis Date  . Dysrhythmia 2001   palpitations -checked out by Cardiologist  . GERD (gastroesophageal reflux disease)   . PONV (postoperative nausea and vomiting)    Past Surgical History:  Procedure Laterality Date  . APPENDECTOMY    . LITHOTRIPSY     x 2  . ORIF PATELLA Left 08/05/2015   Procedure: OPEN REDUCTION INTERNAL (ORIF) LEFT  FIXATION PATELLA;  Surgeon: Susa Day, MD;  Location: [facility];  Service: Orthopedics;  Laterality: Left;  . STONE EXTRACTION  WITH BASKET     x2  . TONSILLECTOMY       Current Outpatient Medications  Medication Sig Dispense Refill  . aspirin EC 81 MG tablet Take 1 tablet (81 mg total) by mouth daily. 90 tablet 3  . carvedilol (COREG) 6.25 MG tablet Take 1 tablet (6.25 mg total) by mouth 2 (two) times daily. 60 tablet 3  . mexiletine (MEXITIL) 150 MG capsule Take 1 capsule (150 mg total) by mouth 2 (two) times daily. 60 capsule 3  . omeprazole (PRILOSEC) 20 MG capsule Take 1 capsule by mouth as needed.    . simvastatin (ZOCOR) 40 MG tablet Take 40 mg by mouth at bedtime.    Marland Kitchen losartan (COZAAR) 50 MG tablet Take 1 tablet (50 mg total) by mouth daily. 90 tablet 1   No current facility-administered medications for this visit.     Allergies:   Patient has no known allergies.   Social History:  The patient  reports that he quit smoking about 30 years ago. His smoking use included cigarettes. He has a 30.00 pack-year smoking history. He has never used smokeless tobacco. He reports current alcohol use. He reports that he does not use drugs.   Family History:  The patient's family history includes Diabetes in his father; Heart disease in his mother; Heart failure in his mother.    ROS:  Please see the history of present illness.   Otherwise, review of systems is positive for none.   All other systems are reviewed and negative.   PHYSICAL EXAM: VS:  BP Marland Kitchen)  146/80   Pulse (!) 56   Ht 5\' 10"  (1.778 m)   Wt 186 lb (84.4 kg)   SpO2 96%   BMI 26.69 kg/m  , BMI Body mass index is 26.69 kg/m. GEN: Well nourished, well developed, in no acute distress  HEENT: normal  Neck: no JVD, carotid bruits, or masses Cardiac: RRR; no murmurs, rubs, or gallops,no edema  Respiratory:  clear to auscultation bilaterally, normal work of breathing GI: soft, nontender, nondistended, + BS MS: no deformity or atrophy  Skin: warm and dry Neuro:  Strength and sensation are intact Psych: euthymic mood, full affect  EKG:  EKG is  ordered today. Personal review of the ekg ordered shows sinus rhythm, rate 56  Recent Labs: No results found for requested labs within last 8760 hours.    Lipid Panel  No results found for: CHOL, TRIG, HDL, CHOLHDL, VLDL, LDLCALC, LDLDIRECT   Wt Readings from Last 3 Encounters:  09/21/19 186 lb (84.4 kg)  09/08/19 190 lb (86.2 kg)  08/13/19 190 lb 3.2 oz (86.3 kg)      Other studies Reviewed: Additional studies/ records that were reviewed today include: TTE 05/11/19  Review of the above records today demonstrates:   1. The left ventricle has normal systolic function with an ejection fraction of 60-65%. The cavity size was normal. Left ventricular diastolic parameters were normal.  2. The right ventricle has normal systolic function. The cavity was normal. There is no increase in right ventricular wall thickness.  Cardiac monitor 06/21/19 personally reviewee 4.8% PVCs Narrow complex tachycardia present.  ASSESSMENT AND PLAN:  1.  PVCs: 4.8% noted on cardiac monitor.  Ablation would be quite difficult at this percentage.  He is currently on mexiletine and is feeling quite well.  He is only having occasional palpitations.  No changes.  2.  Hypertension: Blood pressure is elevated today.  He is currently on carvedilol and a low-dose of losartan.  Alexander Bowen increase losartan to 50 mg.    Current medicines are reviewed at length with the patient today.   The patient does not have concerns regarding his medicines.  The following changes were made today: Increase losartan  Labs/ tests ordered today include:  Orders Placed This Encounter  Procedures  . EKG 12-Lead    Disposition:   FU with Alexander Bowen 6 months  Signed, Ellenie Salome Meredith Leeds, MD  09/21/2019 9:35 AM     [facility] [address] [place] [address] [phone] (office) [phone] (fax)

## 2019-09-21 NOTE — Patient Instructions (Signed)
Medication Instructions:  Your physician has recommended you make the following change in your medication:  1. INCREASE Losartan to 50 mg once a day  * If you need a refill on your cardiac medications before your next appointment, please call your pharmacy.   Labwork: None ordered  Testing/Procedures: None ordered  Follow-Up: Your physician wants you to follow-up in: 6 months with Dr. Curt Bears. You will receive a reminder letter in the mail two months in advance. If you don't receive a letter, please call our office to schedule the follow-up appointment.  Thank you for choosing [facility]!!   Trinidad Curet, RN [phone]  Any Other Special Instructions Will Be Listed Below (If Applicable).

## 2019-09-23 DIAGNOSIS — I471 Supraventricular tachycardia: Secondary | ICD-10-CM | POA: Diagnosis not present

## 2019-09-23 DIAGNOSIS — Z6826 Body mass index (BMI) 26.0-26.9, adult: Secondary | ICD-10-CM | POA: Diagnosis not present

## 2019-09-23 DIAGNOSIS — E782 Mixed hyperlipidemia: Secondary | ICD-10-CM | POA: Diagnosis not present

## 2019-09-23 DIAGNOSIS — Z23 Encounter for immunization: Secondary | ICD-10-CM | POA: Diagnosis not present

## 2019-09-25 ENCOUNTER — Telehealth: Payer: Self-pay | Admitting: Cardiology

## 2019-09-25 NOTE — Telephone Encounter (Signed)
That is difficult one, if breathing is really bad, may needs to go to ER

## 2019-09-25 NOTE — Telephone Encounter (Signed)
Patient called in and reports that last night his blood pressure was 195/105 and since then he felt like he hasn't been able to get a good breath. His blood pressure now is 125/66 but still feels like he can't get a good deep breath. He also reported palpitations from time to time but no other symptoms. Will consult with Dr. Agustin Cree and follow up with patient.

## 2019-09-25 NOTE — Telephone Encounter (Signed)
Patient states that he has been having trouble getting good breathe and BP 190/105 and not feeling well. It is down some this morning and he was very nervous. Please call him .

## 2019-09-25 NOTE — Telephone Encounter (Signed)
Called patient informed him of breathing gets worse he needs to go to the emergency department per Dr. Agustin Cree

## 2019-09-28 DIAGNOSIS — N201 Calculus of ureter: Secondary | ICD-10-CM | POA: Diagnosis not present

## 2019-09-28 DIAGNOSIS — N3020 Other chronic cystitis without hematuria: Secondary | ICD-10-CM | POA: Diagnosis not present

## 2019-10-01 ENCOUNTER — Encounter: Payer: Self-pay | Admitting: Cardiology

## 2019-10-01 ENCOUNTER — Other Ambulatory Visit: Payer: Self-pay

## 2019-10-01 ENCOUNTER — Ambulatory Visit (INDEPENDENT_AMBULATORY_CARE_PROVIDER_SITE_OTHER): Payer: Medicare Other | Admitting: Cardiology

## 2019-10-01 VITALS — BP 150/76 | HR 59 | Ht 70.0 in | Wt 186.2 lb

## 2019-10-01 DIAGNOSIS — R0789 Other chest pain: Secondary | ICD-10-CM

## 2019-10-01 DIAGNOSIS — I471 Supraventricular tachycardia: Secondary | ICD-10-CM

## 2019-10-01 DIAGNOSIS — R002 Palpitations: Secondary | ICD-10-CM

## 2019-10-01 DIAGNOSIS — I10 Essential (primary) hypertension: Secondary | ICD-10-CM

## 2019-10-01 DIAGNOSIS — E785 Hyperlipidemia, unspecified: Secondary | ICD-10-CM | POA: Diagnosis not present

## 2019-10-01 NOTE — Patient Instructions (Signed)
Medication Instructions:  Your physician recommends that you continue on your current medications as directed. Please refer to the Current Medication list given to you today.  *If you need a refill on your cardiac medications before your next appointment, please call your pharmacy*  Lab Work: Your physician recommends that you return for lab work today: bmp, mg, tsh, metanephrines  If you have labs (blood work) drawn today and your tests are completely normal, you will receive your results only by: Marland Kitchen MyChart Message (if you have MyChart) OR . A paper copy in the mail If you have any lab test that is abnormal or we need to change your treatment, we will call you to review the results.  Testing/Procedures: None.   Follow-Up: At [facility], you and your health needs are our priority.  As part of our continuing mission to provide you with exceptional heart care, we have created designated Provider Care Teams.  These Care Teams include your primary Cardiologist (physician) and Advanced Practice Providers (APPs -  Physician Assistants and Nurse Practitioners) who all work together to provide you with the care you need, when you need it.  Your next appointment:   2 month(s)  The format for your next appointment:   In Person  Provider:   Jenne Campus, MD  Other Instructions

## 2019-10-01 NOTE — Progress Notes (Signed)
Cardiology Office Note:    Date:  10/01/2019   ID:  Alexander Bowen, DOB [birth year], MRN [id number]  PCP:  Alexander Shivers, MD  Cardiologist:  Alexander Campus, MD    Referring MD: Alexander Shivers, MD   Chief Complaint  Patient presents with  . 6 week follow up    History of Present Illness:    Alexander Bowen is a 68 y.o. male with extrasystole and palpitations.  We have been struggling with this problem we try flecainide we also try propafenone and now he is on Mexitil.  Initially he did very well however he started having episodes when his heart will flip flop and then we have certain blood pressure that typically happen at evening time.  He was put on losartan as well as carvedilol.  That seems to be helping for few days and now is back to square 1.  Still have the same problem again he describes episodes that he can at evening time have sensation of heart flip-flopping and blood pressure will go up very high.  Denies have any chest pain, tightness, pressure, burning in the chest.  Past Medical History:  Diagnosis Date  . Dysrhythmia 2001   palpitations -checked out by Cardiologist  . GERD (gastroesophageal reflux disease)   . PONV (postoperative nausea and vomiting)     Past Surgical History:  Procedure Laterality Date  . APPENDECTOMY    . LITHOTRIPSY     x 2  . ORIF PATELLA Left 08/05/2015   Procedure: OPEN REDUCTION INTERNAL (ORIF) LEFT  FIXATION PATELLA;  Surgeon: Alexander Day, MD;  Location: [facility];  Service: Orthopedics;  Laterality: Left;  . STONE EXTRACTION WITH BASKET     x2  . TONSILLECTOMY      Current Medications: Current Meds  Medication Sig  . aspirin EC 81 MG tablet Take 1 tablet (81 mg total) by mouth daily.  . carvedilol (COREG) 6.25 MG tablet Take 1 tablet (6.25 mg total) by mouth 2 (two) times daily.  Marland Kitchen losartan (COZAAR) 50 MG tablet Take 1 tablet (50 mg total) by mouth daily.  Marland Kitchen mexiletine (MEXITIL) 150 MG capsule Take 1 capsule (150 mg  total) by mouth 2 (two) times daily.  Marland Kitchen omeprazole (PRILOSEC) 20 MG capsule Take 1 capsule by mouth as needed.  . simvastatin (ZOCOR) 40 MG tablet Take 40 mg by mouth at bedtime.     Allergies:   Patient has no known allergies.   Social History   Socioeconomic History  . Marital status: Married    Spouse name: Not on file  . Number of children: Not on file  . Years of education: Not on file  . Highest education level: Not on file  Occupational History  . Not on file  Social Needs  . Financial resource strain: Not on file  . Food insecurity    Worry: Not on file    Inability: Not on file  . Transportation needs    Medical: Not on file    Non-medical: Not on file  Tobacco Use  . Smoking status: Former Smoker    Packs/Bowen: 1.50    Years: 20.00    Pack years: 30.00    Types: Cigarettes    Quit date: 03/23/1989    Years since quitting: 30.5  . Smokeless tobacco: Never Used  Substance and Sexual Activity  . Alcohol use: Yes    Comment: occassionally  . Drug use: No  . Sexual activity: Not on file  Lifestyle  .  Physical activity    Days per week: Not on file    Minutes per session: Not on file  . Stress: Not on file  Relationships  . Social Herbalist on phone: Not on file    Gets together: Not on file    Attends religious service: Not on file    Active member of club or organization: Not on file    Attends meetings of clubs or organizations: Not on file    Relationship status: Not on file  Other Topics Concern  . Not on file  Social History Narrative  . Not on file     Family History: The patient's family history includes Diabetes in his father; Heart disease in his mother; Heart failure in his mother. ROS:   Please see the history of present illness.    All 14 point review of systems negative except as described per history of present illness  EKGs/Labs/Other Studies Reviewed:      Recent Labs: No results found for requested labs within last  8760 hours.  Recent Lipid Panel No results found for: CHOL, TRIG, HDL, CHOLHDL, VLDL, LDLCALC, LDLDIRECT  Physical Exam:    VS:  BP (!) 150/76   Pulse (!) 59   Ht 5\' 10"  (1.778 m)   Wt 186 lb 3.2 oz (84.5 kg)   SpO2 98%   BMI 26.72 kg/m     Wt Readings from Last 3 Encounters:  10/01/19 186 lb 3.2 oz (84.5 kg)  09/21/19 186 lb (84.4 kg)  09/08/19 190 lb (86.2 kg)     GEN:  Well nourished, well developed in no acute distress HEENT: Normal NECK: No JVD; No carotid bruits LYMPHATICS: No lymphadenopathy CARDIAC: RRR, no murmurs, no rubs, no gallops RESPIRATORY:  Clear to auscultation without rales, wheezing or rhonchi  ABDOMEN: Soft, non-tender, non-distended MUSCULOSKELETAL:  No edema; No deformity  SKIN: Warm and dry LOWER EXTREMITIES: no swelling NEUROLOGIC:  Alert and oriented x 3 PSYCHIATRIC:  Normal affect   ASSESSMENT:    1. Palpitations   2. Hypertension, unspecified type   3. Dyslipidemia   4. Supraventricular tachycardia ([place])   5. Atypical chest pain    PLAN:    In order of problems listed above:  1. Palpitations still problematic he was seen by EP team for consideration of possible ablation however with low burden of his extrasystole that was felt not to be the best idea and still we try medical therapy.  Today I will ask him to have some test done that will be Chem-7, magnesium level I will check his TSH.  I will also check metanephrine level.  I am wondering if he got pheochromocytoma. 2. Dyslipidemia he is on simvastatin which I will continue. 3. Supraventricular tachycardia: Denies having any sustained arrhythmias 4. Atypical chest pain denies having any   Medication Adjustments/Labs and Tests Ordered: Current medicines are reviewed at length with the patient today.  Concerns regarding medicines are outlined above.  Orders Placed This Encounter  Procedures  . Metanephrines, Pheochromocyt  . Basic Metabolic Panel (BMET)  . Magnesium  . TSH    Medication changes: No orders of the defined types were placed in this encounter.   Signed, Alexander Liter, MD, [facility] 10/01/2019 12:34 PM    [address]

## 2019-10-02 LAB — BASIC METABOLIC PANEL
BUN/Creatinine Ratio: 14 (ref 10–24)
BUN: 15 mg/dL (ref 8–27)
CO2: 23 mmol/L (ref 20–29)
Calcium: 9.1 mg/dL (ref 8.6–10.2)
Chloride: 103 mmol/L (ref 96–106)
Creatinine, Ser: 1.09 mg/dL (ref 0.76–1.27)
GFR calc Af Amer: 80 mL/min/{1.73_m2} (ref >59)
GFR calc non Af Amer: 69 mL/min/{1.73_m2} (ref >59)
Glucose: 139 mg/dL — ABNORMAL HIGH (ref 65–99)
Potassium: 4.4 mmol/L (ref 3.5–5.2)
Sodium: 142 mmol/L (ref 134–144)

## 2019-10-02 LAB — MAGNESIUM: Magnesium: 2.3 mg/dL (ref 1.6–2.3)

## 2019-10-02 LAB — TSH: TSH: 1.23 u[IU]/mL (ref 0.450–4.500)

## 2019-10-09 LAB — METANEPHRINES, PLASMA
Metanephrine, Free: 31.2 pg/mL (ref 0.0–88.0)
Normetanephrine, Free: 62.7 pg/mL (ref 0.0–191.8)

## 2019-10-12 DIAGNOSIS — E782 Mixed hyperlipidemia: Secondary | ICD-10-CM | POA: Diagnosis not present

## 2019-10-12 DIAGNOSIS — I471 Supraventricular tachycardia: Secondary | ICD-10-CM | POA: Diagnosis not present

## 2019-10-12 DIAGNOSIS — I4891 Unspecified atrial fibrillation: Secondary | ICD-10-CM | POA: Diagnosis not present

## 2019-10-23 DIAGNOSIS — R0602 Shortness of breath: Secondary | ICD-10-CM

## 2019-10-29 LAB — BASIC METABOLIC PANEL
BUN/Creatinine Ratio: 13 (ref 10–24)
BUN: 14 mg/dL (ref 8–27)
CO2: 26 mmol/L (ref 20–29)
Calcium: 10 mg/dL (ref 8.6–10.2)
Chloride: 103 mmol/L (ref 96–106)
Creatinine, Ser: 1.1 mg/dL (ref 0.76–1.27)
GFR calc Af Amer: 78 mL/min/{1.73_m2} (ref >59)
GFR calc non Af Amer: 68 mL/min/{1.73_m2} (ref >59)
Glucose: 84 mg/dL (ref 65–99)
Potassium: 5.3 mmol/L — ABNORMAL HIGH (ref 3.5–5.2)
Sodium: 140 mmol/L (ref 134–144)

## 2019-10-29 LAB — PRO B NATRIURETIC PEPTIDE: NT-Pro BNP: 171 pg/mL (ref 0–376)

## 2019-11-02 ENCOUNTER — Telehealth: Payer: Self-pay | Admitting: Emergency Medicine

## 2019-11-02 NOTE — Telephone Encounter (Signed)
Okay less than probably decrease carvedilol to 3.125 twice daily and see if he feels any better

## 2019-11-02 NOTE — Telephone Encounter (Signed)
Called patient. Per Dr. Agustin Cree patient advised to decrease carvedilol to 3.125 mg twice daily and see if this helps with his dizziness. Patient verbally understood. He will let us know if this doesn't help. No further questions.

## 2019-11-02 NOTE — Telephone Encounter (Signed)
Called patient and gave him lab results. Advised him per Dr. Agustin Cree ito decrease losartan to 25 mg daily. During the call patient reports he has been dizzy for the past couple of months since taking carvedilol 6.25 mg twice daily and mexiletine 150 mg twice daily. He wants to know if we can decrease one or more of these to relieve his dizziness. Will consult with Dr. Agustin Cree.

## 2019-11-12 ENCOUNTER — Telehealth: Payer: Self-pay

## 2019-11-12 NOTE — Telephone Encounter (Signed)
Medication refill for Omeprazole 20mg  capsule to take daily has come in. Do you want me to refill this medication for this patient? You haven't see this patient in over a year

## 2019-11-16 MED ORDER — OMEPRAZOLE 20 MG PO CPDR: 20.0000 mg | DELAYED_RELEASE_CAPSULE | Freq: Every day | ORAL | 4 refills | Status: DC

## 2019-11-16 NOTE — Telephone Encounter (Signed)
Rx sent to pharmacy   

## 2019-11-16 NOTE — Telephone Encounter (Signed)
Please refill omeprazole 20 mg p.o. once a day, 90, 4 refills Would like to see him in the office once Covid situation is better, likely in the next 8 to 12 months Thx  RG

## 2019-11-20 ENCOUNTER — Ambulatory Visit (INDEPENDENT_AMBULATORY_CARE_PROVIDER_SITE_OTHER): Payer: Medicare Other | Admitting: Cardiology

## 2019-11-20 ENCOUNTER — Encounter: Payer: Self-pay | Admitting: Cardiology

## 2019-11-20 ENCOUNTER — Other Ambulatory Visit: Payer: Self-pay

## 2019-11-20 VITALS — BP 164/82 | HR 100 | Ht 70.0 in | Wt 184.0 lb

## 2019-11-20 DIAGNOSIS — I493 Ventricular premature depolarization: Secondary | ICD-10-CM

## 2019-11-20 DIAGNOSIS — E785 Hyperlipidemia, unspecified: Secondary | ICD-10-CM

## 2019-11-20 DIAGNOSIS — R002 Palpitations: Secondary | ICD-10-CM | POA: Diagnosis not present

## 2019-11-20 DIAGNOSIS — R0600 Dyspnea, unspecified: Secondary | ICD-10-CM

## 2019-11-20 DIAGNOSIS — R0609 Other forms of dyspnea: Secondary | ICD-10-CM | POA: Insufficient documentation

## 2019-11-20 DIAGNOSIS — I471 Supraventricular tachycardia: Secondary | ICD-10-CM

## 2019-11-20 HISTORY — DX: Other forms of dyspnea: R06.09

## 2019-11-20 HISTORY — DX: Dyspnea, unspecified: R06.00

## 2019-11-20 HISTORY — DX: Ventricular premature depolarization: I49.3

## 2019-11-20 NOTE — Progress Notes (Signed)
Cardiology Office Note:    Date:  11/20/2019   ID:  Alexander Bowen, DOB [birth year], MRN [id number]  PCP:  Maryella Shivers, MD  Cardiologist:  Jenne Campus, MD    Referring MD: Maryella Shivers, MD   Chief Complaint  Patient presents with  . Palpitations    History of Present Illness:    Alexander Bowen is a 69 y.o. male with past medical history significant for palpitations.  His been having PVCs as well as history of atrial flutter which was very remote, doing trying to control his PVCs with medication however with limited success.  He had difficulty tolerating amiodarone he had difficulty tolerating flecainide.  Now he is on mexiletine as well as beta-blocker.  Initially that seems to be working quite well but now he still have a lot of palpitations and it bothers him a lot.  He also complained of having some shortness of breath at night when he lay flat interestingly he does not have any exertional shortness of breath.  We had a long discussion about what to do with the situation.  I will schedule him to have echocardiogram to reassess left ventricle ejection fraction.  I will put another monitor on him with instruction to press the button when he has symptoms I would like to make sure exactly what he feels and what arrhythmia he is feeling.  After that I will refer him back to our EP team for consideration of more advanced therapy.  Past Medical History:  Diagnosis Date  . Dysrhythmia 2001   palpitations -checked out by Cardiologist  . GERD (gastroesophageal reflux disease)   . PONV (postoperative nausea and vomiting)     Past Surgical History:  Procedure Laterality Date  . APPENDECTOMY    . LITHOTRIPSY     x 2  . ORIF PATELLA Left 08/05/2015   Procedure: OPEN REDUCTION INTERNAL (ORIF) LEFT  FIXATION PATELLA;  Surgeon: Susa Day, MD;  Location: [facility];  Service: Orthopedics;  Laterality: Left;  . STONE EXTRACTION WITH BASKET     x2  . TONSILLECTOMY      Current  Medications: Current Meds  Medication Sig  . aspirin EC 81 MG tablet Take 1 tablet (81 mg total) by mouth daily.  . carvedilol (COREG) 6.25 MG tablet Take 1 tablet (6.25 mg total) by mouth 2 (two) times daily. (Patient taking differently: Take 3.125 mg by mouth 2 (two) times daily. )  . losartan (COZAAR) 50 MG tablet Take 1 tablet (50 mg total) by mouth daily. (Patient taking differently: Take 25 mg by mouth daily. )  . mexiletine (MEXITIL) 150 MG capsule Take 1 capsule (150 mg total) by mouth 2 (two) times daily.  Marland Kitchen omeprazole (PRILOSEC) 20 MG capsule Take 1 capsule by mouth as needed.  Marland Kitchen omeprazole (PRILOSEC) 20 MG capsule Take 1 capsule (20 mg total) by mouth daily. Please call [phone] to schedule an appointment in 8 to 12 months  . simvastatin (ZOCOR) 40 MG tablet Take 40 mg by mouth at bedtime.     Allergies:   Patient has no known allergies.   Social History   Socioeconomic History  . Marital status: Married    Spouse name: Not on file  . Number of children: Not on file  . Years of education: Not on file  . Highest education level: Not on file  Occupational History  . Not on file  Tobacco Use  . Smoking status: Former Smoker    Packs/day: 1.50  Years: 20.00    Pack years: 30.00    Types: Cigarettes    Quit date: 03/23/1989    Years since quitting: 30.6  . Smokeless tobacco: Never Used  Substance and Sexual Activity  . Alcohol use: Yes    Comment: occassionally  . Drug use: No  . Sexual activity: Not on file  Other Topics Concern  . Not on file  Social History Narrative  . Not on file   Social Determinants of Health   Financial Resource Strain:   . Difficulty of Paying Living Expenses: Not on file  Food Insecurity:   . Worried About Charity fundraiser in the Last Year: Not on file  . Ran Out of Food in the Last Year: Not on file  Transportation Needs:   . Lack of Transportation (Medical): Not on file  . Lack of Transportation (Non-Medical): Not on file    Physical Activity:   . Days of Exercise per Week: Not on file  . Minutes of Exercise per Session: Not on file  Stress:   . Feeling of Stress : Not on file  Social Connections:   . Frequency of Communication with Friends and Family: Not on file  . Frequency of Social Gatherings with Friends and Family: Not on file  . Attends Religious Services: Not on file  . Active Member of Clubs or Organizations: Not on file  . Attends Archivist Meetings: Not on file  . Marital Status: Not on file     Family History: The patient's family history includes Diabetes in his father; Heart disease in his mother; Heart failure in his mother. ROS:   Please see the history of present illness.    All 14 point review of systems negative except as described per history of present illness  EKGs/Labs/Other Studies Reviewed:      Recent Labs: 10/01/2019: Magnesium 2.3; TSH 1.230 10/28/2019: BUN 14; Creatinine, Ser 1.10; NT-Pro BNP 171; Potassium 5.3; Sodium 140  Recent Lipid Panel No results found for: CHOL, TRIG, HDL, CHOLHDL, VLDL, LDLCALC, LDLDIRECT  Physical Exam:    VS:  BP (!) 164/82 (BP Location: Left Arm, Patient Position: Sitting, Cuff Size: Normal)   Pulse 100   Ht 5\' 10"  (1.778 m)   Wt 184 lb (83.5 kg)   SpO2 (!) 66%   BMI 26.40 kg/m     Wt Readings from Last 3 Encounters:  11/20/19 184 lb (83.5 kg)  10/01/19 186 lb 3.2 oz (84.5 kg)  09/21/19 186 lb (84.4 kg)     GEN:  Well nourished, well developed in no acute distress HEENT: Normal NECK: No JVD; No carotid bruits LYMPHATICS: No lymphadenopathy CARDIAC: RRR, no murmurs, no rubs, no gallops RESPIRATORY:  Clear to auscultation without rales, wheezing or rhonchi  ABDOMEN: Soft, non-tender, non-distended MUSCULOSKELETAL:  No edema; No deformity  SKIN: Warm and dry LOWER EXTREMITIES: no swelling NEUROLOGIC:  Alert and oriented x 3 PSYCHIATRIC:  Normal affect   ASSESSMENT:    1. Palpitations   2. PVC's (premature  ventricular contractions)   3. Supraventricular tachycardia ([place])   4. Dyslipidemia   5. Dyspnea on exertion    PLAN:    In order of problems listed above:  1. Palpitations plan as outlined above 2. PVCs with limited success controlling with medications. 3. SVT.  Denies having any sustained arrhythmia lately.  We will put monitor to clarify that. 4. Dyspnea on exertion we will schedule him to have echocardiogram to reassess left ventricle ejection fraction  Medication Adjustments/Labs and Tests Ordered: Current medicines are reviewed at length with the patient today.  Concerns regarding medicines are outlined above.  No orders of the defined types were placed in this encounter.  Medication changes: No orders of the defined types were placed in this encounter.   Signed, Park Liter, MD, [facility] 11/20/2019 4:01 PM    [place]

## 2019-11-20 NOTE — Patient Instructions (Signed)
Medication Instructions:  Your physician recommends that you continue on your current medications as directed. Please refer to the Current Medication list given to you today.  *If you need a refill on your cardiac medications before your next appointment, please call your pharmacy*  Lab Work: None.  If you have labs (blood work) drawn today and your tests are completely normal, you will receive your results only by: Marland Kitchen MyChart Message (if you have MyChart) OR . A paper copy in the mail If you have any lab test that is abnormal or we need to change your treatment, we will call you to review the results.  Testing/Procedures: Your physician has recommended that you wear a holter monitor. Holter monitors are medical devices that record the heart's electrical activity. Doctors most often use these monitors to diagnose arrhythmias. Arrhythmias are problems with the speed or rhythm of the heartbeat. The monitor is a small, portable device. You can wear one while you do your normal daily activities. This is usually used to diagnose what is causing palpitations/syncope (passing out). Wear for 7 days   Your physician has requested that you have an echocardiogram. Echocardiography is a painless test that uses sound waves to create images of your heart. It provides your doctor with information about the size and shape of your heart and how well your heart's chambers and valves are working. This procedure takes approximately one hour. There are no restrictions for this procedure.     Follow-Up: At [facility], you and your health needs are our priority.  As part of our continuing mission to provide you with exceptional heart care, we have created designated Provider Care Teams.  These Care Teams include your primary Cardiologist (physician) and Advanced Practice Providers (APPs -  Physician Assistants and Nurse Practitioners) who all work together to provide you with the care you need, when you need  it.  Your next appointment:   1 month(s)  The format for your next appointment:   In Person  Provider:   Jenne Campus, MD  Other Instructions   Echocardiogram An echocardiogram is a procedure that uses painless sound waves (ultrasound) to produce an image of the heart. Images from an echocardiogram can provide important information about:  Signs of coronary artery disease (CAD).  Aneurysm detection. An aneurysm is a weak or damaged part of an artery wall that bulges out from the normal force of blood pumping through the body.  Heart size and shape. Changes in the size or shape of the heart can be associated with certain conditions, including heart failure, aneurysm, and CAD.  Heart muscle function.  Heart valve function.  Signs of a past heart attack.  Fluid buildup around the heart.  Thickening of the heart muscle.  A tumor or infectious growth around the heart valves. Tell a health care provider about:  Any allergies you have.  All medicines you are taking, including vitamins, herbs, eye drops, creams, and over-the-counter medicines.  Any blood disorders you have.  Any surgeries you have had.  Any medical conditions you have.  Whether you are pregnant or may be pregnant. What are the risks? Generally, this is a safe procedure. However, problems may occur, including:  Allergic reaction to dye (contrast) that may be used during the procedure. What happens before the procedure? No specific preparation is needed. You may eat and drink normally. What happens during the procedure?   An IV tube may be inserted into one of your veins.  You may receive  contrast through this tube. A contrast is an injection that improves the quality of the pictures from your heart.  A gel will be applied to your chest.  A wand-like tool (transducer) will be moved over your chest. The gel will help to transmit the sound waves from the transducer.  The sound waves will  harmlessly bounce off of your heart to allow the heart images to be captured in real-time motion. The images will be recorded on a computer. The procedure may vary among health care providers and hospitals. What happens after the procedure?  You may return to your normal, everyday life, including diet, activities, and medicines, unless your health care provider tells you not to do that. Summary  An echocardiogram is a procedure that uses painless sound waves (ultrasound) to produce an image of the heart.  Images from an echocardiogram can provide important information about the size and shape of your heart, heart muscle function, heart valve function, and fluid buildup around your heart.  You do not need to do anything to prepare before this procedure. You may eat and drink normally.  After the echocardiogram is completed, you may return to your normal, everyday life, unless your health care provider tells you not to do that. This information is not intended to replace advice given to you by your health care provider. Make sure you discuss any questions you have with your health care provider. Document Revised: 02/19/2019 Document Reviewed: 12/01/2016 Elsevier Patient Education  [place].

## 2019-11-24 ENCOUNTER — Ambulatory Visit (HOSPITAL_BASED_OUTPATIENT_CLINIC_OR_DEPARTMENT_OTHER)
Admission: RE | Admit: 2019-11-24 | Discharge: 2019-11-24 | Disposition: A | Discharge status: Home / Self Care | Payer: Medicare Other | Source: Ambulatory Visit | Attending: Cardiology | Admitting: Cardiology

## 2019-11-24 ENCOUNTER — Other Ambulatory Visit: Payer: Self-pay

## 2019-11-24 DIAGNOSIS — R0600 Dyspnea, unspecified: Secondary | ICD-10-CM | POA: Insufficient documentation

## 2019-11-24 DIAGNOSIS — R002 Palpitations: Secondary | ICD-10-CM | POA: Diagnosis not present

## 2019-11-24 NOTE — Progress Notes (Signed)
  Echocardiogram 2D Echocardiogram has been performed.  Cardell Peach 11/24/2019, 1:39 PM

## 2019-11-26 ENCOUNTER — Ambulatory Visit: Payer: Medicare Other | Admitting: Cardiology

## 2019-12-01 ENCOUNTER — Ambulatory Visit: Payer: Medicare Other | Admitting: Cardiology

## 2019-12-01 DIAGNOSIS — I10 Essential (primary) hypertension: Secondary | ICD-10-CM | POA: Diagnosis not present

## 2019-12-01 DIAGNOSIS — Z7982 Long term (current) use of aspirin: Secondary | ICD-10-CM | POA: Diagnosis not present

## 2019-12-01 DIAGNOSIS — Z7901 Long term (current) use of anticoagulants: Secondary | ICD-10-CM | POA: Diagnosis not present

## 2019-12-01 DIAGNOSIS — G4733 Obstructive sleep apnea (adult) (pediatric): Secondary | ICD-10-CM | POA: Diagnosis not present

## 2019-12-01 DIAGNOSIS — I493 Ventricular premature depolarization: Secondary | ICD-10-CM | POA: Diagnosis not present

## 2019-12-01 DIAGNOSIS — I483 Typical atrial flutter: Secondary | ICD-10-CM | POA: Diagnosis not present

## 2019-12-01 DIAGNOSIS — Z87891 Personal history of nicotine dependence: Secondary | ICD-10-CM | POA: Diagnosis not present

## 2019-12-01 DIAGNOSIS — K219 Gastro-esophageal reflux disease without esophagitis: Secondary | ICD-10-CM | POA: Diagnosis not present

## 2019-12-01 DIAGNOSIS — E785 Hyperlipidemia, unspecified: Secondary | ICD-10-CM | POA: Diagnosis not present

## 2019-12-01 DIAGNOSIS — Z79899 Other long term (current) drug therapy: Secondary | ICD-10-CM | POA: Diagnosis not present

## 2019-12-01 DIAGNOSIS — R002 Palpitations: Secondary | ICD-10-CM | POA: Diagnosis not present

## 2019-12-01 DIAGNOSIS — I4891 Unspecified atrial fibrillation: Secondary | ICD-10-CM | POA: Diagnosis not present

## 2019-12-01 DIAGNOSIS — I119 Hypertensive heart disease without heart failure: Secondary | ICD-10-CM | POA: Diagnosis not present

## 2019-12-01 DIAGNOSIS — I480 Paroxysmal atrial fibrillation: Secondary | ICD-10-CM | POA: Diagnosis not present

## 2019-12-02 DIAGNOSIS — E785 Hyperlipidemia, unspecified: Secondary | ICD-10-CM | POA: Diagnosis not present

## 2019-12-02 DIAGNOSIS — I493 Ventricular premature depolarization: Secondary | ICD-10-CM | POA: Diagnosis not present

## 2019-12-02 DIAGNOSIS — I480 Paroxysmal atrial fibrillation: Secondary | ICD-10-CM | POA: Diagnosis not present

## 2019-12-02 DIAGNOSIS — I119 Hypertensive heart disease without heart failure: Secondary | ICD-10-CM | POA: Diagnosis not present

## 2019-12-02 DIAGNOSIS — G4733 Obstructive sleep apnea (adult) (pediatric): Secondary | ICD-10-CM | POA: Diagnosis not present

## 2019-12-02 DIAGNOSIS — I4891 Unspecified atrial fibrillation: Secondary | ICD-10-CM | POA: Diagnosis not present

## 2019-12-02 DIAGNOSIS — K219 Gastro-esophageal reflux disease without esophagitis: Secondary | ICD-10-CM | POA: Diagnosis not present

## 2019-12-02 DIAGNOSIS — I10 Essential (primary) hypertension: Secondary | ICD-10-CM | POA: Diagnosis not present

## 2019-12-03 ENCOUNTER — Ambulatory Visit (INDEPENDENT_AMBULATORY_CARE_PROVIDER_SITE_OTHER): Payer: Medicare Other

## 2019-12-03 DIAGNOSIS — R002 Palpitations: Secondary | ICD-10-CM | POA: Diagnosis not present

## 2019-12-04 ENCOUNTER — Telehealth: Payer: Self-pay | Admitting: Cardiology

## 2019-12-04 NOTE — Telephone Encounter (Signed)
Patient was in the hospital Tuesday night-Wednesday and was put on some new medications. He would like Dr. Marthann Schiller Nurse to call him to discuss the changes that were made in his medication.   He normally sees Dr. Raliegh Ip , but Dr. Bettina Gavia was the attending Cardiologist in the hospital

## 2019-12-04 NOTE — Telephone Encounter (Signed)
Reports bout of AFib recently while in the hospital and  "they want to put me on Eliquis".  He has concerns and would like Dr. Wendy Poet input about starting a blood thinner.  His primary concern for taking one is d/t having internal hemorrhoids.  States that they bleed when he experiences constipation and worries this could be problematic.  "How will taking this affect my hemmorroids?"  He also mentions that he has been experiencing more frequency constipation recently d/t medications added/changed.  They reduced his Carvedilol when he was at [facility], to 3.125 mg BID.  He wants to make sure Dr. Agustin Cree is agreeable to this decrease.  Pt is not sure why they decreased it.  He asks if he should limit his normal activities d/t recent abn heart rhythms.  Reports he did have palpitations yesterday and last night.  Started having palpitations at rest, lasted about 30-45 min, no issues.  Pt advised to continue his normal ADLs and walks.  We do not want to limit him unless he begins to experience issues when on walks or doing his normal activities. Advised to contact office if he begins to experience issues with normal daily activities.  Pt goes for his second covid shot soon and they are asking about him being on a blood thinner (he wasn't on blood thinner for first shot).  Pt advised ok to get shot while on a blood thinner.  Pt is currently wearing a 7 day monitor.  Informed that I would ensure Dr. Curt Bears sees result.  He reports tremors in his right arm/hand are worsening.  States that they sometimes wake him up at night.  They started since starting Mexiletine.  Pt aware I would have Dr. Curt Bears address this further.

## 2019-12-09 NOTE — Telephone Encounter (Signed)
Please call him and ask how is he doing with smaller dose of Coreg.

## 2019-12-09 NOTE — Telephone Encounter (Signed)
Called patient, he reports he feels fine after the decrease in carvedilol. He did start eliquis 5 mg twice daily and wants to make sure Dr. Agustin Cree is ok with this. Will consult with him.

## 2019-12-10 NOTE — Telephone Encounter (Signed)
Yes that is fine to continue Eliquis

## 2019-12-10 NOTE — Telephone Encounter (Signed)
Called patient informed him Dr. Agustin Cree does want him to continue eliquis. Patient verbally understood. No further questions.

## 2019-12-11 DIAGNOSIS — I471 Supraventricular tachycardia: Secondary | ICD-10-CM | POA: Diagnosis not present

## 2019-12-11 DIAGNOSIS — E782 Mixed hyperlipidemia: Secondary | ICD-10-CM | POA: Diagnosis not present

## 2019-12-11 DIAGNOSIS — I4891 Unspecified atrial fibrillation: Secondary | ICD-10-CM | POA: Diagnosis not present

## 2019-12-14 DIAGNOSIS — Z7689 Persons encountering health services in other specified circumstances: Secondary | ICD-10-CM | POA: Diagnosis not present

## 2019-12-14 DIAGNOSIS — K5900 Constipation, unspecified: Secondary | ICD-10-CM | POA: Diagnosis not present

## 2019-12-14 DIAGNOSIS — Z6826 Body mass index (BMI) 26.0-26.9, adult: Secondary | ICD-10-CM | POA: Diagnosis not present

## 2019-12-14 DIAGNOSIS — I482 Chronic atrial fibrillation, unspecified: Secondary | ICD-10-CM | POA: Diagnosis not present

## 2019-12-19 DIAGNOSIS — R002 Palpitations: Secondary | ICD-10-CM | POA: Diagnosis not present

## 2019-12-24 ENCOUNTER — Other Ambulatory Visit: Payer: Self-pay

## 2019-12-24 ENCOUNTER — Encounter: Payer: Self-pay | Admitting: Cardiology

## 2019-12-24 ENCOUNTER — Ambulatory Visit (INDEPENDENT_AMBULATORY_CARE_PROVIDER_SITE_OTHER): Payer: Medicare Other | Admitting: Cardiology

## 2019-12-24 VITALS — BP 132/80 | HR 67 | Ht 70.0 in | Wt 186.0 lb

## 2019-12-24 DIAGNOSIS — I480 Paroxysmal atrial fibrillation: Secondary | ICD-10-CM

## 2019-12-24 DIAGNOSIS — I493 Ventricular premature depolarization: Secondary | ICD-10-CM | POA: Diagnosis not present

## 2019-12-24 DIAGNOSIS — R002 Palpitations: Secondary | ICD-10-CM | POA: Diagnosis not present

## 2019-12-24 DIAGNOSIS — I471 Supraventricular tachycardia: Secondary | ICD-10-CM | POA: Diagnosis not present

## 2019-12-24 DIAGNOSIS — E785 Hyperlipidemia, unspecified: Secondary | ICD-10-CM

## 2019-12-24 DIAGNOSIS — R0789 Other chest pain: Secondary | ICD-10-CM | POA: Diagnosis not present

## 2019-12-24 HISTORY — DX: Paroxysmal atrial fibrillation: I48.0

## 2019-12-24 NOTE — Addendum Note (Signed)
Addended by: Ashok Norris on: 12/24/2019 10:12 AM   Modules accepted: Orders

## 2019-12-24 NOTE — Progress Notes (Signed)
Cardiology Office Note:    Date:  12/24/2019   ID:  Alexander Bowen, DOB [birth year], MRN [id number]  PCP:  Maryella Shivers, MD  Cardiologist:  Jenne Campus, MD    Referring MD: Maryella Shivers, MD   Chief Complaint  Patient presents with  . Follow-up    History of Present Illness:    Alexander Bowen is a 69 y.o. male with past medical history significant for paroxysmal atrial fibrillation, symptomatic PVCs, PACs.  Recently he ended up going to [facility].  The reason why he went there was the fact that he got palpitations.  He was found to be in atrial fibrillation.  His AV node was black with some channel blocker intravenously and then he converted spontaneously to sinus rhythm.  Anticoagulation has been initiated at that time.  He comes today to my office to talk about his problems.  He still get plenty of palpitations.  I did put a Zio patch on him which showed some PVCs as well as APCs, but was symptomatic.  Overall however burden of this extrasystole was only less than 1%.  In spite of that he is being bothered by this palpitations a lot.  We tried all different kind of antiarrhythmic before which include flecainide, amiodarone now he is on mexiletine as well as beta-blocker to help to suppress his PVCs however success is very limited and he is still not doing well.  We had a long discussion about his situation today.  He would like to see some electrophysiologist at [facility].  We will make arrangements for that.  I think consideration should be given to potentially atrial fibrillation ablation or PVC ablation as well.  On top of that he already developed some side effect of mexiletine.  He does have tremor in the right upper extremity.  Past Medical History:  Diagnosis Date  . Dysrhythmia 2001   palpitations -checked out by Cardiologist  . GERD (gastroesophageal reflux disease)   . PONV (postoperative nausea and vomiting)     Past Surgical History:  Procedure  Laterality Date  . APPENDECTOMY    . LITHOTRIPSY     x 2  . ORIF PATELLA Left 08/05/2015   Procedure: OPEN REDUCTION INTERNAL (ORIF) LEFT  FIXATION PATELLA;  Surgeon: Susa Day, MD;  Location: [facility];  Service: Orthopedics;  Laterality: Left;  . STONE EXTRACTION WITH BASKET     x2  . TONSILLECTOMY      Current Medications: Current Meds  Medication Sig  . carvedilol (COREG) 3.125 MG tablet Take 3.125 mg by mouth 2 (two) times daily.  Marland Kitchen diltiazem (CARDIZEM) 30 MG tablet Take 30 mg by mouth as needed. Take if heart rate goes over 120  . ELIQUIS 5 MG TABS tablet Take 5 mg by mouth 2 (two) times daily.  Marland Kitchen losartan (COZAAR) 50 MG tablet Take 50 mg by mouth daily.  Marland Kitchen mexiletine (MEXITIL) 150 MG capsule Take 1 capsule (150 mg total) by mouth 2 (two) times daily.  Marland Kitchen omeprazole (PRILOSEC) 20 MG capsule Take 1 capsule by mouth as needed.  . simvastatin (ZOCOR) 40 MG tablet Take 40 mg by mouth at bedtime.     Allergies:   Patient has no known allergies.   Social History   Socioeconomic History  . Marital status: Married    Spouse name: Not on file  . Number of children: Not on file  . Years of education: Not on file  . Highest education level: Not on file  Occupational  History  . Not on file  Tobacco Use  . Smoking status: Former Smoker    Packs/day: 1.50    Years: 20.00    Pack years: 30.00    Types: Cigarettes    Quit date: 03/23/1989    Years since quitting: 30.7  . Smokeless tobacco: Never Used  Substance and Sexual Activity  . Alcohol use: Yes    Comment: occassionally  . Drug use: No  . Sexual activity: Not on file  Other Topics Concern  . Not on file  Social History Narrative  . Not on file   Social Determinants of Health   Financial Resource Strain:   . Difficulty of Paying Living Expenses: Not on file  Food Insecurity:   . Worried About Charity fundraiser in the Last Year: Not on file  . Ran Out of Food in the Last Year: Not on file  Transportation Needs:    . Lack of Transportation (Medical): Not on file  . Lack of Transportation (Non-Medical): Not on file  Physical Activity:   . Days of Exercise per Week: Not on file  . Minutes of Exercise per Session: Not on file  Stress:   . Feeling of Stress : Not on file  Social Connections:   . Frequency of Communication with Friends and Family: Not on file  . Frequency of Social Gatherings with Friends and Family: Not on file  . Attends Religious Services: Not on file  . Active Member of Clubs or Organizations: Not on file  . Attends Archivist Meetings: Not on file  . Marital Status: Not on file     Family History: The patient's family history includes Diabetes in his father; Heart disease in his mother; Heart failure in his mother. ROS:   Please see the history of present illness.    All 14 point review of systems negative except as described per history of present illness  EKGs/Labs/Other Studies Reviewed:    Zio patch done in January 2021 showed: Conclusion:  Symptomatic ventricle as well as supraventricular ectopy Short runs of supraventricular tachycardia noted but asymptomatic  Echocardiogram done in January 2021 showed: 1. Left ventricular ejection fraction, by visual estimation, is 60 to  65%. The left ventricle has normal function. Left ventricular septal wall  thickness was normal. Mildly increased left ventricular posterior wall  thickness. There is no left  ventricular hypertrophy.  2. The left ventricle has no regional wall motion abnormalities.  3. Global right ventricle has normal systolic function.The right  ventricular size is normal. No increase in right ventricular wall  thickness.  4. Left atrial size was normal.  5. Right atrial size was normal.  6. The mitral valve is normal in structure. Trivial mitral valve  regurgitation. No evidence of mitral stenosis.  7. The tricuspid valve is normal in structure.  8. The aortic valve is tricuspid.  Aortic valve regurgitation is not  visualized. Mild aortic valve sclerosis without stenosis.  9. The pulmonic valve was normal in structure. Pulmonic valve  regurgitation is not visualized.  10. The aortic arch was not well visualized.  11. The inferior vena cava is normal in size with greater than 50%  respiratory variability, suggesting right atrial pressure of 3 mmHg.    Stress test done in October 2020 showed:  The left ventricular ejection fraction is normal (55-65%).  Nuclear stress EF: 61%.  There was no ST segment deviation noted during stress.  No T wave inversion was noted during stress.  Defect 1: There is a small defect of mild severity present in the basal anteroseptal location. The fixed area of perfusion defect with normal wall motion is likely due to soft tissue attenuation.  The study is normal.  This is a low risk study.    Recent Labs: 10/01/2019: Magnesium 2.3; TSH 1.230 10/28/2019: BUN 14; Creatinine, Ser 1.10; NT-Pro BNP 171; Potassium 5.3; Sodium 140  Recent Lipid Panel No results found for: CHOL, TRIG, HDL, CHOLHDL, VLDL, LDLCALC, LDLDIRECT  Physical Exam:    VS:  BP 132/80 (BP Location: Right Arm, Patient Position: Sitting, Cuff Size: Normal)   Pulse 67   Ht 5\' 10"  (1.778 m)   Wt 186 lb (84.4 kg)   SpO2 99%   BMI 26.69 kg/m     Wt Readings from Last 3 Encounters:  12/24/19 186 lb (84.4 kg)  11/20/19 184 lb (83.5 kg)  10/01/19 186 lb 3.2 oz (84.5 kg)     GEN:  Well nourished, well developed in no acute distress HEENT: Normal NECK: No JVD; No carotid bruits LYMPHATICS: No lymphadenopathy CARDIAC: RRR, no murmurs, no rubs, no gallops RESPIRATORY:  Clear to auscultation without rales, wheezing or rhonchi  ABDOMEN: Soft, non-tender, non-distended MUSCULOSKELETAL:  No edema; No deformity  SKIN: Warm and dry LOWER EXTREMITIES: no swelling NEUROLOGIC:  Alert and oriented x 3 PSYCHIATRIC:  Normal affect   ASSESSMENT:    1.  Supraventricular tachycardia (HCC)   2. Paroxysmal atrial fibrillation ([place])   3. PVC's (premature ventricular contractions)   4. Palpitations   5. Dyslipidemia   6. Atypical chest pain    PLAN:    In order of problems listed above:  1. Paroxysmal atrial fibrillation maintaining sinus rhythm, anticoagulated because of his chads 2 Vascor equals 2 which is for his age and hypertension.  He is on carvedilol 3.125 twice daily.  Previously he was on 6.25 but felt very weak and tired, therefore his dose being decreased. 2. Symptomatic PVCs and APCs verified with son.  Now partially suppressed with mexiletine as well as beta-blocker.  Still however symptomatic.  Previously intolerant to amiodarone as well as flecainide. 3. Dyslipidemia on simvastatin 40 which I will continue.   Medication Adjustments/Labs and Tests Ordered: Current medicines are reviewed at length with the patient today.  Concerns regarding medicines are outlined above.  No orders of the defined types were placed in this encounter.  Medication changes: No orders of the defined types were placed in this encounter.   Signed, Park Liter, MD, [facility] 12/24/2019 9:59 AM    [place]

## 2019-12-24 NOTE — Patient Instructions (Signed)
Medication Instructions:  Your physician recommends that you continue on your current medications as directed. Please refer to the Current Medication list given to you today.  *If you need a refill on your cardiac medications before your next appointment, please call your pharmacy*  Lab Work: None.  If you have labs (blood work) drawn today and your tests are completely normal, you will receive your results only by: Marland Kitchen MyChart Message (if you have MyChart) OR . A paper copy in the mail If you have any lab test that is abnormal or we need to change your treatment, we will call you to review the results.  Testing/Procedures: None.   Follow-Up: At [facility], you and your health needs are our priority.  As part of our continuing mission to provide you with exceptional heart care, we have created designated Provider Care Teams.  These Care Teams include your primary Cardiologist (physician) and Advanced Practice Providers (APPs -  Physician Assistants and Nurse Practitioners) who all work together to provide you with the care you need, when you need it.  Your next appointment:   2 month(s)  The format for your next appointment:   In Person  Provider:   Jenne Campus, MD  Other Instructions   Dr. Agustin Cree has referred you to Dr. Norm Salt at [facility] with electrophysiology. They should call you within 1 week for a appointment. If not please call our office.

## 2019-12-28 DIAGNOSIS — H9190 Unspecified hearing loss, unspecified ear: Secondary | ICD-10-CM | POA: Diagnosis not present

## 2019-12-28 DIAGNOSIS — R438 Other disturbances of smell and taste: Secondary | ICD-10-CM | POA: Diagnosis not present

## 2019-12-28 DIAGNOSIS — H9319 Tinnitus, unspecified ear: Secondary | ICD-10-CM

## 2019-12-28 DIAGNOSIS — Z8679 Personal history of other diseases of the circulatory system: Secondary | ICD-10-CM | POA: Diagnosis not present

## 2019-12-28 DIAGNOSIS — Z7901 Long term (current) use of anticoagulants: Secondary | ICD-10-CM | POA: Diagnosis not present

## 2019-12-28 HISTORY — DX: Tinnitus, unspecified ear: H93.19

## 2020-01-01 DIAGNOSIS — Z6826 Body mass index (BMI) 26.0-26.9, adult: Secondary | ICD-10-CM | POA: Diagnosis not present

## 2020-01-01 DIAGNOSIS — H9313 Tinnitus, bilateral: Secondary | ICD-10-CM | POA: Diagnosis not present

## 2020-01-01 DIAGNOSIS — R251 Tremor, unspecified: Secondary | ICD-10-CM | POA: Diagnosis not present

## 2020-01-01 DIAGNOSIS — I4891 Unspecified atrial fibrillation: Secondary | ICD-10-CM | POA: Diagnosis not present

## 2020-01-10 DIAGNOSIS — I482 Chronic atrial fibrillation, unspecified: Secondary | ICD-10-CM | POA: Diagnosis not present

## 2020-01-10 DIAGNOSIS — E782 Mixed hyperlipidemia: Secondary | ICD-10-CM | POA: Diagnosis not present

## 2020-01-10 DIAGNOSIS — I471 Supraventricular tachycardia: Secondary | ICD-10-CM | POA: Diagnosis not present

## 2020-01-10 DIAGNOSIS — I4891 Unspecified atrial fibrillation: Secondary | ICD-10-CM | POA: Diagnosis not present

## 2020-01-15 ENCOUNTER — Other Ambulatory Visit: Payer: Self-pay | Admitting: Cardiology

## 2020-01-15 ENCOUNTER — Telehealth: Payer: Self-pay | Admitting: Cardiology

## 2020-01-15 MED ORDER — ELIQUIS 5 MG PO TABS: 5.0000 mg | ORAL_TABLET | Freq: Two times a day (BID) | ORAL | 1 refills | Status: DC

## 2020-01-15 NOTE — Telephone Encounter (Signed)
New Message     *STAT* If patient is at the pharmacy, call can be transferred to refill team.   1. Which medications need to be refilled? (please list name of each medication and dose if known) ELIQUIS 5 MG TABS tablet  2. Which pharmacy/location (including street and city if local pharmacy) is medication to be sent to? [facility] [phone] - [place], [address] AT [address]  3. Do they need a 30 day or 90 day supply? [place]

## 2020-01-15 NOTE — Telephone Encounter (Signed)
Medication refilled

## 2020-01-15 NOTE — Telephone Encounter (Signed)
New Message    Pt is calling and says Dr Agustin Cree was suppose to send a referral for him to see Dr Tawny Hopping at [facility]  He says he has not heard anything in regards to the referral and is following up on it     Please advise

## 2020-01-15 NOTE — Telephone Encounter (Signed)
Called [facility] to check on referral they report that they never received even though I have confirmation. I have resent. I informed patient of this. He verbally understood and will call within a week if he doesn't hear anything.

## 2020-02-10 DIAGNOSIS — E782 Mixed hyperlipidemia: Secondary | ICD-10-CM | POA: Diagnosis not present

## 2020-02-10 DIAGNOSIS — I482 Chronic atrial fibrillation, unspecified: Secondary | ICD-10-CM | POA: Diagnosis not present

## 2020-02-10 DIAGNOSIS — I471 Supraventricular tachycardia: Secondary | ICD-10-CM | POA: Diagnosis not present

## 2020-03-11 DIAGNOSIS — I4891 Unspecified atrial fibrillation: Secondary | ICD-10-CM | POA: Diagnosis not present

## 2020-03-11 DIAGNOSIS — I471 Supraventricular tachycardia: Secondary | ICD-10-CM | POA: Diagnosis not present

## 2020-03-11 DIAGNOSIS — E782 Mixed hyperlipidemia: Secondary | ICD-10-CM | POA: Diagnosis not present

## 2020-03-11 DIAGNOSIS — I482 Chronic atrial fibrillation, unspecified: Secondary | ICD-10-CM | POA: Diagnosis not present

## 2020-03-14 ENCOUNTER — Ambulatory Visit (INDEPENDENT_AMBULATORY_CARE_PROVIDER_SITE_OTHER): Payer: Medicare Other | Admitting: Cardiology

## 2020-03-14 ENCOUNTER — Other Ambulatory Visit: Payer: Self-pay

## 2020-03-14 ENCOUNTER — Encounter: Payer: Self-pay | Admitting: Cardiology

## 2020-03-14 VITALS — BP 138/70 | HR 57 | Ht 70.0 in | Wt 176.6 lb

## 2020-03-14 DIAGNOSIS — R002 Palpitations: Secondary | ICD-10-CM

## 2020-03-14 DIAGNOSIS — E785 Hyperlipidemia, unspecified: Secondary | ICD-10-CM | POA: Diagnosis not present

## 2020-03-14 DIAGNOSIS — R0600 Dyspnea, unspecified: Secondary | ICD-10-CM | POA: Diagnosis not present

## 2020-03-14 DIAGNOSIS — I480 Paroxysmal atrial fibrillation: Secondary | ICD-10-CM | POA: Diagnosis not present

## 2020-03-14 DIAGNOSIS — I493 Ventricular premature depolarization: Secondary | ICD-10-CM

## 2020-03-14 DIAGNOSIS — I471 Supraventricular tachycardia: Secondary | ICD-10-CM | POA: Diagnosis not present

## 2020-03-14 DIAGNOSIS — R0609 Other forms of dyspnea: Secondary | ICD-10-CM

## 2020-03-14 MED ORDER — CARVEDILOL 3.125 MG PO TABS: 3.1250 mg | ORAL_TABLET | Freq: Two times a day (BID) | ORAL | 3 refills | Status: DC

## 2020-03-14 NOTE — Progress Notes (Signed)
Cardiology Office Note:    Date:  03/14/2020   ID:  Alexander Bowen, DOB Aug 02, 1951, MRN [id number]  PCP:  Maryella Shivers, MD  Cardiologist:  Jenne Campus, MD    Referring MD: Maryella Shivers, MD   No chief complaint on file. Doing better, but concerned about the tremor of the right arm  History of Present Illness:    Alexander Bowen is a 69 y.o. male with past medical history significant for paroxysmal atrial fibrillation, symptomatic PVCs, PACs.  Recently he ended up going to [facility].  The reason why he went there was the fact that he got palpitations.  He was found to be in atrial fibrillation.  His AV node was black with some channel blocker intravenously and then he converted spontaneously to sinus rhythm.  Anticoagulation has been initiated at that time.  He comes today to my office to talk about his problems.  He still get plenty of palpitations.  I did put a Zio patch on him which showed some PVCs as well as APCs, but was symptomatic.  Overall however burden of this extrasystole was only less than 1%.  In spite of that he is being bothered by this palpitations a lot.  We tried all different kind of antiarrhythmic before which include flecainide, amiodarone now he is on mexiletine as well as beta-blocker to help to suppress his PVCs however success is very limited and he is still not doing well.  We had a long discussion about his situation today.  He would like to see some electrophysiologist at [facility].  We will make arrangements for that.  I think consideration should be given to potentially atrial fibrillation ablation or PVC ablation as well.  On top of that he already developed some side effect of mexiletine.  He does have tremor in the right upper extremity.   Past Medical History:  Diagnosis Date  . Dysrhythmia 2001   palpitations -checked out by Cardiologist  . GERD (gastroesophageal reflux disease)   . PONV (postoperative nausea and vomiting)     Past  Surgical History:  Procedure Laterality Date  . APPENDECTOMY    . LITHOTRIPSY     x 2  . ORIF PATELLA Left 08/05/2015   Procedure: OPEN REDUCTION INTERNAL (ORIF) LEFT  FIXATION PATELLA;  Surgeon: Susa Day, MD;  Location: [facility];  Service: Orthopedics;  Laterality: Left;  . STONE EXTRACTION WITH BASKET     x2  . TONSILLECTOMY      Current Medications: Current Meds  Medication Sig  . carvedilol (COREG) 3.125 MG tablet Take 3.125 mg by mouth 2 (two) times daily.  Marland Kitchen diltiazem (CARDIZEM) 30 MG tablet Take 30 mg by mouth as needed. Take if heart rate goes over 120  . ELIQUIS 5 MG TABS tablet Take 1 tablet (5 mg total) by mouth 2 (two) times daily.  Marland Kitchen losartan (COZAAR) 50 MG tablet Take 50 mg by mouth daily.  Marland Kitchen mexiletine (MEXITIL) 150 MG capsule Take 1 capsule (150 mg total) by mouth 2 (two) times daily.  Marland Kitchen omeprazole (PRILOSEC) 20 MG capsule Take 1 capsule by mouth as needed.  . simvastatin (ZOCOR) 40 MG tablet Take 40 mg by mouth at bedtime.     Allergies:   Patient has no known allergies.   Social History   Socioeconomic History  . Marital status: Married    Spouse name: Not on file  . Number of children: Not on file  . Years of education: Not on file  .  Highest education level: Not on file  Occupational History  . Not on file  Tobacco Use  . Smoking status: Former Smoker    Packs/day: 1.50    Years: 20.00    Pack years: 30.00    Types: Cigarettes    Quit date: 03/23/1989    Years since quitting: 30.9  . Smokeless tobacco: Never Used  Substance and Sexual Activity  . Alcohol use: Yes    Comment: occassionally  . Drug use: No  . Sexual activity: Not on file  Other Topics Concern  . Not on file  Social History Narrative  . Not on file   Social Determinants of Health   Financial Resource Strain:   . Difficulty of Paying Living Expenses:   Food Insecurity:   . Worried About Charity fundraiser in the Last Year:   . Arboriculturist in the Last Year:     Transportation Needs:   . Film/video editor (Medical):   Marland Kitchen Lack of Transportation (Non-Medical):   Physical Activity:   . Days of Exercise per Week:   . Minutes of Exercise per Session:   Stress:   . Feeling of Stress :   Social Connections:   . Frequency of Communication with Friends and Family:   . Frequency of Social Gatherings with Friends and Family:   . Attends Religious Services:   . Active Member of Clubs or Organizations:   . Attends Archivist Meetings:   Marland Kitchen Marital Status:      Family History: The patient's family history includes Diabetes in his father; Heart disease in his mother; Heart failure in his mother. ROS:   Please see the history of present illness.    All 14 point review of systems negative except as described per history of present illness  EKGs/Labs/Other Studies Reviewed:      Recent Labs: 10/01/2019: Magnesium 2.3; TSH 1.230 10/28/2019: BUN 14; Creatinine, Ser 1.10; NT-Pro BNP 171; Potassium 5.3; Sodium 140  Recent Lipid Panel No results found for: CHOL, TRIG, HDL, CHOLHDL, VLDL, LDLCALC, LDLDIRECT  Physical Exam:    VS:  BP 138/70   Pulse (!) 57   Ht 5\' 10"  (1.778 m)   Wt 176 lb 9.6 oz (80.1 kg)   SpO2 100%   BMI 25.34 kg/m     Wt Readings from Last 3 Encounters:  03/14/20 176 lb 9.6 oz (80.1 kg)  12/24/19 186 lb (84.4 kg)  11/20/19 184 lb (83.5 kg)     GEN:  Well nourished, well developed in no acute distress HEENT: Normal NECK: No JVD; No carotid bruits LYMPHATICS: No lymphadenopathy CARDIAC: RRR, no murmurs, no rubs, no gallops RESPIRATORY:  Clear to auscultation without rales, wheezing or rhonchi  ABDOMEN: Soft, non-tender, non-distended MUSCULOSKELETAL:  No edema; No deformity  SKIN: Warm and dry LOWER EXTREMITIES: no swelling NEUROLOGIC:  Alert and oriented x 3 PSYCHIATRIC:  Normal affect   ASSESSMENT:    1. Paroxysmal atrial fibrillation (HCC)   2. PVC's (premature ventricular contractions)   3.  Supraventricular tachycardia ([place])   4. Dyslipidemia   5. Dyspnea on exertion   6. Palpitations    PLAN:    In order of problems listed above:  1. Paroxysmal atrial fibrillation, anticoagulated which I completed, denies having any recent episodes.  We will continue present management. 2. Frequent ventricular ectopy with blocker seem a lot.  He did trial different medication with many side effects now he is on mexiletine with relatively good control  of debridement but now he is developing tremor of the right arm.  He also stopped mexiletine.  I told him it is okay to do it with the understanding he probably have more extrasystole.  I also told him he terminal not go away with discontinuation maxillectomy will refer him to neurology.  Was still awaiting for contact from [facility].  He wants to be seen there with electrophysiological department with consideration of more advanced therapy. 3. Dyslipidemia he is taking Zocor 40, will make arrangements for fasting lipid profile. 4. Palpitations is slightly better but still happening especially anytime when he relaxed. 5. Dyspnea on exertion: Only very mild.  He is able to do he walks every single day for a few miles.   Medication Adjustments/Labs and Tests Ordered: Current medicines are reviewed at length with the patient today.  Concerns regarding medicines are outlined above.  No orders of the defined types were placed in this encounter.  Medication changes: No orders of the defined types were placed in this encounter.   Signed, Park Liter, MD, [facility] 03/14/2020 8:55 AM    [place]

## 2020-03-14 NOTE — Patient Instructions (Signed)
Medication Instructions:  Your physician has recommended you make the following change in your medication:  STOP Mexiletine  *If you need a refill on your cardiac medications before your next appointment, please call your pharmacy*   Lab Work: Non.  If you have labs (blood work) drawn today and your tests are completely normal, you will receive your results only by: Marland Kitchen MyChart Message (if you have MyChart) OR . A paper copy in the mail If you have any lab test that is abnormal or we need to change your treatment, we will call you to review the results.   Testing/Procedures: None.    Follow-Up: At [facility], you and your health needs are our priority.  As part of our continuing mission to provide you with exceptional heart care, we have created designated Provider Care Teams.  These Care Teams include your primary Cardiologist (physician) and Advanced Practice Providers (APPs -  Physician Assistants and Nurse Practitioners) who all work together to provide you with the care you need, when you need it.  We recommend signing up for the patient portal called "MyChart".  Sign up information is provided on this After Visit Summary.  MyChart is used to connect with patients for Virtual Visits (Telemedicine).  Patients are able to view lab/test results, encounter notes, upcoming appointments, etc.  Non-urgent messages can be sent to your provider as well.   To learn more about what you can do with MyChart, go to [email or web address].    Your next appointment:   3 month(s)  The format for your next appointment:   In Person  Provider:   Jenne Campus, MD   Other Instructions

## 2020-03-14 NOTE — Telephone Encounter (Signed)
[facility] for a third time, again they state they have not received the referral. I have faxed for the third time to [phone]. Patient given office number as well and informed we refaxed today and he can call them to follow up if needed. He was also instructed to call us within a week if he does not hear from [place].

## 2020-03-15 DIAGNOSIS — Z7189 Other specified counseling: Secondary | ICD-10-CM | POA: Diagnosis not present

## 2020-03-15 DIAGNOSIS — Z1339 Encounter for screening examination for other mental health and behavioral disorders: Secondary | ICD-10-CM | POA: Diagnosis not present

## 2020-03-15 DIAGNOSIS — Z1331 Encounter for screening for depression: Secondary | ICD-10-CM | POA: Diagnosis not present

## 2020-03-15 DIAGNOSIS — E782 Mixed hyperlipidemia: Secondary | ICD-10-CM | POA: Diagnosis not present

## 2020-03-15 DIAGNOSIS — Z136 Encounter for screening for cardiovascular disorders: Secondary | ICD-10-CM | POA: Diagnosis not present

## 2020-03-15 DIAGNOSIS — Z139 Encounter for screening, unspecified: Secondary | ICD-10-CM | POA: Diagnosis not present

## 2020-03-15 DIAGNOSIS — Z0000 Encounter for general adult medical examination without abnormal findings: Secondary | ICD-10-CM | POA: Diagnosis not present

## 2020-03-21 DIAGNOSIS — E782 Mixed hyperlipidemia: Secondary | ICD-10-CM | POA: Diagnosis not present

## 2020-03-21 DIAGNOSIS — Z6826 Body mass index (BMI) 26.0-26.9, adult: Secondary | ICD-10-CM | POA: Diagnosis not present

## 2020-03-21 DIAGNOSIS — I482 Chronic atrial fibrillation, unspecified: Secondary | ICD-10-CM | POA: Diagnosis not present

## 2020-03-22 DIAGNOSIS — H25813 Combined forms of age-related cataract, bilateral: Secondary | ICD-10-CM | POA: Diagnosis not present

## 2020-03-22 DIAGNOSIS — H43393 Other vitreous opacities, bilateral: Secondary | ICD-10-CM | POA: Diagnosis not present

## 2020-04-04 ENCOUNTER — Ambulatory Visit: Payer: Medicare Other | Admitting: Cardiology

## 2020-04-11 DIAGNOSIS — E782 Mixed hyperlipidemia: Secondary | ICD-10-CM | POA: Diagnosis not present

## 2020-04-11 DIAGNOSIS — I471 Supraventricular tachycardia: Secondary | ICD-10-CM | POA: Diagnosis not present

## 2020-04-11 DIAGNOSIS — I4891 Unspecified atrial fibrillation: Secondary | ICD-10-CM | POA: Diagnosis not present

## 2020-04-11 DIAGNOSIS — I482 Chronic atrial fibrillation, unspecified: Secondary | ICD-10-CM | POA: Diagnosis not present

## 2020-05-10 ENCOUNTER — Other Ambulatory Visit: Payer: Self-pay | Admitting: Cardiology

## 2020-05-19 DIAGNOSIS — I491 Atrial premature depolarization: Secondary | ICD-10-CM

## 2020-05-19 HISTORY — DX: Atrial premature depolarization: I49.1

## 2020-05-25 DIAGNOSIS — I480 Paroxysmal atrial fibrillation: Secondary | ICD-10-CM | POA: Diagnosis not present

## 2020-05-30 DIAGNOSIS — Z6825 Body mass index (BMI) 25.0-25.9, adult: Secondary | ICD-10-CM | POA: Diagnosis not present

## 2020-05-30 DIAGNOSIS — R251 Tremor, unspecified: Secondary | ICD-10-CM | POA: Diagnosis not present

## 2020-05-30 DIAGNOSIS — R109 Unspecified abdominal pain: Secondary | ICD-10-CM | POA: Diagnosis not present

## 2020-06-12 DIAGNOSIS — I4891 Unspecified atrial fibrillation: Secondary | ICD-10-CM | POA: Diagnosis not present

## 2020-06-12 DIAGNOSIS — I482 Chronic atrial fibrillation, unspecified: Secondary | ICD-10-CM | POA: Diagnosis not present

## 2020-06-12 DIAGNOSIS — E782 Mixed hyperlipidemia: Secondary | ICD-10-CM | POA: Diagnosis not present

## 2020-06-12 DIAGNOSIS — I471 Supraventricular tachycardia: Secondary | ICD-10-CM | POA: Diagnosis not present

## 2020-06-15 ENCOUNTER — Other Ambulatory Visit: Payer: Self-pay

## 2020-06-15 ENCOUNTER — Ambulatory Visit (INDEPENDENT_AMBULATORY_CARE_PROVIDER_SITE_OTHER): Payer: Medicare Other | Admitting: Cardiology

## 2020-06-15 ENCOUNTER — Encounter: Payer: Self-pay | Admitting: Cardiology

## 2020-06-15 VITALS — BP 102/62 | HR 66 | Resp 16 | Ht 70.0 in | Wt 172.2 lb

## 2020-06-15 DIAGNOSIS — I480 Paroxysmal atrial fibrillation: Secondary | ICD-10-CM | POA: Diagnosis not present

## 2020-06-15 DIAGNOSIS — I493 Ventricular premature depolarization: Secondary | ICD-10-CM

## 2020-06-15 DIAGNOSIS — E785 Hyperlipidemia, unspecified: Secondary | ICD-10-CM

## 2020-06-15 DIAGNOSIS — R002 Palpitations: Secondary | ICD-10-CM | POA: Diagnosis not present

## 2020-06-15 DIAGNOSIS — R0600 Dyspnea, unspecified: Secondary | ICD-10-CM | POA: Diagnosis not present

## 2020-06-15 DIAGNOSIS — R0609 Other forms of dyspnea: Secondary | ICD-10-CM

## 2020-06-15 NOTE — Progress Notes (Signed)
Cardiology Office Note:    Date:  06/15/2020   ID:  Alexander Bowen, DOB 04/08/1951, MRN [id number]  PCP:  Maryella Shivers, MD  Cardiologist:  Jenne Campus, MD    Referring MD: Maryella Shivers, MD   Chief Complaint  Patient presents with  . Follow-up    3 month FU. Only C/O is PVC's.  I am doing fair  History of Present Illness:    Alexander Bowen is a 69 y.o. male with complex past medical history which include symptomatic PVCs, PACs, paroxysmal atrial fibrillation.  He got trials of multiple antiarrhythmic therapy with not much response.  Recently he has Dr. Norm Salt at [facility] with consideration of different antiarrhythmic therapy versus ablation.  Conclusion was that we will try to go very conservatively.,  He was given extra dose of magnesium try to see if it helps with palpitations.  He tells me that it did not make any difference.  On top of that magnesium made his stomach hurts.  And he want to cut down or discontinue this medication since it does not help him at all.  In the process his mexiletine has been withdrawn and he does not feel any more palpitations without mexiletine.  The plan was also to put Zio patch to see what the burden of palpitation with magnesium.  However, he does not want to wear it.  Biggest concern that he have at this stage is tremor that he have in the right arm.  He is worried that he may be having Parkinson.  He is scheduled to see neurologist at the end of September he said he still cannot do anything about his heart until appointment with neurology will happen.  Described to have maybe 1 episode of atrial fibrillation few months ago [place].  Short lasting he took extra calcium channel blocker which led to conversion to sinus rhythm.  Past Medical History:  Diagnosis Date  . Dysrhythmia 2001   palpitations -checked out by Cardiologist  . GERD (gastroesophageal reflux disease)   . PONV (postoperative nausea and vomiting)      Past Surgical History:  Procedure Laterality Date  . APPENDECTOMY    . LITHOTRIPSY     x 2  . ORIF PATELLA Left 08/05/2015   Procedure: OPEN REDUCTION INTERNAL (ORIF) LEFT  FIXATION PATELLA;  Surgeon: Susa Day, MD;  Location: [facility];  Service: Orthopedics;  Laterality: Left;  . STONE EXTRACTION WITH BASKET     x2  . TONSILLECTOMY      Current Medications: Current Meds  Medication Sig  . carvedilol (COREG) 3.125 MG tablet Take 1 tablet (3.125 mg total) by mouth 2 (two) times daily.  Marland Kitchen diltiazem (CARDIZEM) 30 MG tablet Take 30 mg by mouth as needed. Take if heart rate goes over 120  . ELIQUIS 5 MG TABS tablet Take 1 tablet (5 mg total) by mouth 2 (two) times daily.  Marland Kitchen losartan (COZAAR) 50 MG tablet TAKE 1 TABLET(50 MG) BY MOUTH DAILY  . magnesium gluconate (MAGONATE) 500 MG tablet Take 500 mg by mouth 2 (two) times daily.  Marland Kitchen omeprazole (PRILOSEC) 20 MG capsule Take 1 capsule by mouth as needed.  . simvastatin (ZOCOR) 40 MG tablet Take 40 mg by mouth at bedtime.     Allergies:   Patient has no known allergies.   Social History   Socioeconomic History  . Marital status: Married    Spouse name: Not on file  . Number of  children: Not on file  . Years of education: Not on file  . Highest education level: Not on file  Occupational History  . Not on file  Tobacco Use  . Smoking status: Former Smoker    Packs/day: 1.50    Years: 20.00    Pack years: 30.00    Types: Cigarettes    Quit date: 03/23/1989    Years since quitting: 31.2  . Smokeless tobacco: Never Used  Vaping Use  . Vaping Use: Never used  Substance and Sexual Activity  . Alcohol use: Yes    Comment: occassionally  . Drug use: No  . Sexual activity: Not on file  Other Topics Concern  . Not on file  Social History Narrative  . Not on file   Social Determinants of Health   Financial Resource Strain:   . Difficulty of Paying Living Expenses:   Food Insecurity:   . Worried About Charity fundraiser  in the Last Year:   . Arboriculturist in the Last Year:   Transportation Needs:   . Film/video editor (Medical):   Marland Kitchen Lack of Transportation (Non-Medical):   Physical Activity:   . Days of Exercise per Week:   . Minutes of Exercise per Session:   Stress:   . Feeling of Stress :   Social Connections:   . Frequency of Communication with Friends and Family:   . Frequency of Social Gatherings with Friends and Family:   . Attends Religious Services:   . Active Member of Clubs or Organizations:   . Attends Archivist Meetings:   Marland Kitchen Marital Status:      Family History: The patient's family history includes Diabetes in his father; Heart disease in his mother; Heart failure in his mother. ROS:   Please see the history of present illness.    All 14 point review of systems negative except as described per history of present illness  EKGs/Labs/Other Studies Reviewed:      Recent Labs: 10/01/2019: Magnesium 2.3; TSH 1.230 10/28/2019: BUN 14; Creatinine, Ser 1.10; NT-Pro BNP 171; Potassium 5.3; Sodium 140  Recent Lipid Panel No results found for: CHOL, TRIG, HDL, CHOLHDL, VLDL, LDLCALC, LDLDIRECT  Physical Exam:    VS:  BP 102/62 (BP Location: Right Arm, Patient Position: Sitting)   Pulse 66   Resp 16   Ht 5\' 10"  (1.778 m)   Wt 172 lb 3.2 oz (78.1 kg)   SpO2 95%   BMI 24.71 kg/m     Wt Readings from Last 3 Encounters:  06/15/20 172 lb 3.2 oz (78.1 kg)  03/14/20 176 lb 9.6 oz (80.1 kg)  12/24/19 186 lb (84.4 kg)     GEN:  Well nourished, well developed in no acute distress HEENT: Normal NECK: No JVD; No carotid bruits LYMPHATICS: No lymphadenopathy CARDIAC: RRR, no murmurs, no rubs, no gallops RESPIRATORY:  Clear to auscultation without rales, wheezing or rhonchi  ABDOMEN: Soft, non-tender, non-distended MUSCULOSKELETAL:  No edema; No deformity  SKIN: Warm and dry LOWER EXTREMITIES: no swelling NEUROLOGIC:  Alert and oriented x 3 PSYCHIATRIC:  Normal  affect   ASSESSMENT:    1. PVC's (premature ventricular contractions)   2. Paroxysmal atrial fibrillation (HCC)   3. Palpitations   4. Dyspnea on exertion   5. Dyslipidemia    PLAN:    In order of problems listed above:  1. PVCs.  Difficult to control but overall burden of PVCs is very small.  Overall I fairly benign  condition the biggest problem is the fact that he feels this is passing a lot.  Trial of higher dose of magnesium did not make significant improvement and he want to discontinue that. 2. Paroxysmal atrial fibrillation.  Anticoagulated, will continue for now.  Described 1 episodes of atrial fibrillation that converted spontaneously with calcium channel blocker. 3. Palpitations: Plan as outlined above. 4. Dyspnea on exertion and denies having any. 5. Dyslipidemia: He is on Zocor 40 which I will continue.  I do have his fasting lipid profile showing LDL of 94 and HDL of 42 this is from K PN which I will continue. 6. I appreciate Dr. Glennon Mac expertise.  Understanding does have follow-up with team at [facility].  The biggest concern the patient has right now is possibility of Parkinson because of shakiness in the right upper extremity.  He said he does not want to do anything about his heart until issue of potential Parkinson will be clarified.   Medication Adjustments/Labs and Tests Ordered: Current medicines are reviewed at length with the patient today.  Concerns regarding medicines are outlined above.  No orders of the defined types were placed in this encounter.  Medication changes: No orders of the defined types were placed in this encounter.   Signed, Park Liter, MD, Operating [facility] Services 06/15/2020 8:45 AM    [place]

## 2020-06-15 NOTE — Patient Instructions (Signed)

## 2020-06-23 ENCOUNTER — Other Ambulatory Visit: Payer: Self-pay | Admitting: Cardiology

## 2020-07-07 DIAGNOSIS — I493 Ventricular premature depolarization: Secondary | ICD-10-CM | POA: Diagnosis not present

## 2020-07-07 DIAGNOSIS — I482 Chronic atrial fibrillation, unspecified: Secondary | ICD-10-CM | POA: Diagnosis not present

## 2020-07-12 DIAGNOSIS — I482 Chronic atrial fibrillation, unspecified: Secondary | ICD-10-CM | POA: Diagnosis not present

## 2020-07-12 DIAGNOSIS — E782 Mixed hyperlipidemia: Secondary | ICD-10-CM | POA: Diagnosis not present

## 2020-07-12 DIAGNOSIS — I4891 Unspecified atrial fibrillation: Secondary | ICD-10-CM | POA: Diagnosis not present

## 2020-07-12 DIAGNOSIS — I471 Supraventricular tachycardia: Secondary | ICD-10-CM | POA: Diagnosis not present

## 2020-07-15 DIAGNOSIS — I480 Paroxysmal atrial fibrillation: Secondary | ICD-10-CM | POA: Diagnosis not present

## 2020-07-15 DIAGNOSIS — I493 Ventricular premature depolarization: Secondary | ICD-10-CM | POA: Diagnosis not present

## 2020-08-11 ENCOUNTER — Other Ambulatory Visit: Payer: Self-pay

## 2020-08-11 ENCOUNTER — Ambulatory Visit (INDEPENDENT_AMBULATORY_CARE_PROVIDER_SITE_OTHER): Payer: Medicare Other | Admitting: Neurology

## 2020-08-11 ENCOUNTER — Encounter: Payer: Self-pay | Admitting: Neurology

## 2020-08-11 VITALS — BP 136/72 | HR 56 | Ht 70.0 in | Wt 171.0 lb

## 2020-08-11 DIAGNOSIS — G20 Parkinson's disease: Secondary | ICD-10-CM | POA: Diagnosis not present

## 2020-08-11 DIAGNOSIS — R251 Tremor, unspecified: Secondary | ICD-10-CM

## 2020-08-11 NOTE — Progress Notes (Signed)
Subjective:    Patient ID: Alexander Bowen is a 69 y.o. male.  HPI     Star Age, MD, PhD [facility] [address], [address] [place], [place] [address]  Dear Dr. Nyra Capes,  I saw your patient, Alexander Bowen, upon your kind request in my neurologic clinic today for initial consultation of his tremor.  The patient is unaccompanied today.  As you know, Alexander Bowen is a 69 year old right-handed gentleman with an underlying medical history of SVT, PAF, on Eliquis, PVCs and PACs, hypertension, hyperlipidemia, recurrent kidney stones, and reflux disease, who reports a right hand tremor for the past 1 year. I reviewed your office note from 05/30/2020.  His symptoms started approximately in October 2020.  At the time, he was going through different medication trials for his heart arrhythmias.  He tried flecainide, then propafenone, and mexiletine but eventually came off of all medications.  Since approximately mid May of this year he has been off of mexiletine which was the last medication he was on.  He has been followed by cardiology on a regular basis.  His tremor started in the right hand and was initially intermittent but has progressed.  He initially thought that 1 of these medications was the culprit but even after stopping them he had an ongoing tremor and in fact it has progressed a little bit in that it is more frequent in occurrence.  It is often at rest.  It does not actually cause him to have any difficulty performing fine motor tasks.  He does not have any significant tremor while feeding himself for using his dominant hand to shave.  He has not noticed any tremor on the left side or lower extremities.  He has not had any balance problems and no falls.  He does endorse shoulder discomfort for the past 3 years on the right side.  He does not have a family history for Parkinson's disease but his dad had a tremor, perhaps only on one side, the patient is not  completely sure, dad lived to be 32.  Mom lived to be 68.  The patient has 2 brothers.  He lives with his wife, he is retired, worked in a [employer] operational officer, no kids.  He quit smoking in 1991 and quit drinking alcohol or caffeine when he was first noted to have A. fib in February 2021.  The tremor seems to be a little bit worse first thing in the morning and as the day progresses it seems to be better or less noticeable.  He had a home sleep test 7 4 5  years ago and at that time he did not have any obstructive sleep apnea.  TSH in November 2020, was normal.  His Past Medical History Is Significant For: Past Medical History:  Diagnosis Date  . Atrial fibrillation ([place])   . Dysrhythmia 2001   palpitations -checked out by Cardiologist  . GERD (gastroesophageal reflux disease)   . High cholesterol   . Hypertension   . PONV (postoperative nausea and vomiting)     His Past Surgical History Is Significant For: Past Surgical History:  Procedure Laterality Date  . APPENDECTOMY    . CHOLECYSTECTOMY    . LITHOTRIPSY     x 2  . ORIF PATELLA Left 08/05/2015   Procedure: OPEN REDUCTION INTERNAL (ORIF) LEFT  FIXATION PATELLA;  Surgeon: Susa Day, MD;  Location: [facility];  Service: Orthopedics;  Laterality: Left;  . STONE EXTRACTION WITH BASKET  x2  . TONSILLECTOMY      His Family History Is Significant For: Family History  Problem Relation Age of Onset  . Heart disease Mother   . Heart failure Mother   . Diabetes Father   . Prostate cancer Father   . Dementia Father     His Social History Is Significant For: Social History   Socioeconomic History  . Marital status: Married    Spouse name: Not on file  . Number of children: Not on file  . Years of education: Not on file  . Highest education level: Not on file  Occupational History  . Not on file  Tobacco Use  . Smoking status: Former Smoker    Packs/day: 1.50    Years: 20.00    Pack years: 30.00    Types: Cigarettes     Quit date: 03/23/1989    Years since quitting: 31.4  . Smokeless tobacco: Never Used  Vaping Use  . Vaping Use: Never used  Substance and Sexual Activity  . Alcohol use: Yes    Comment: occassionally  . Drug use: No  . Sexual activity: Not on file  Other Topics Concern  . Not on file  Social History Narrative  . Not on file   Social Determinants of Health   Financial Resource Strain:   . Difficulty of Paying Living Expenses: Not on file  Food Insecurity:   . Worried About Charity fundraiser in the Last Year: Not on file  . Ran Out of Food in the Last Year: Not on file  Transportation Needs:   . Lack of Transportation (Medical): Not on file  . Lack of Transportation (Non-Medical): Not on file  Physical Activity:   . Days of Exercise per Week: Not on file  . Minutes of Exercise per Session: Not on file  Stress:   . Feeling of Stress : Not on file  Social Connections:   . Frequency of Communication with Friends and Family: Not on file  . Frequency of Social Gatherings with Friends and Family: Not on file  . Attends Religious Services: Not on file  . Active Member of Clubs or Organizations: Not on file  . Attends Archivist Meetings: Not on file  . Marital Status: Not on file    His Allergies Are:  No Known Allergies:   His Current Medications Are:  Outpatient Encounter Medications as of 08/11/2020  Medication Sig  . carvedilol (COREG) 3.125 MG tablet Take 1 tablet (3.125 mg total) by mouth 2 (two) times daily.  Marland Kitchen diltiazem (CARDIZEM) 30 MG tablet Take 30 mg by mouth as needed. Take if heart rate goes over 120  . ELIQUIS 5 MG TABS tablet TAKE 1 TABLET(5 MG) BY MOUTH TWICE DAILY  . losartan (COZAAR) 50 MG tablet TAKE 1 TABLET(50 MG) BY MOUTH DAILY  . magnesium gluconate (MAGONATE) 500 MG tablet Take 400 mg by mouth 2 (two) times daily.   Marland Kitchen omeprazole (PRILOSEC) 20 MG capsule Take 1 capsule by mouth as needed.  . simvastatin (ZOCOR) 40 MG tablet Take 40 mg by  mouth at bedtime.   No facility-administered encounter medications on file as of 08/11/2020.  :   Review of Systems:  Out of a complete 14 point review of systems, all are reviewed and negative with the exception of these symptoms as listed below:  Review of Systems  Neurological:       Here for consult on worsening right hand tremor. Pt reports sx started  about a year ago when he place on antiarrythmic drugs (mexiletine, flecainide and one other). Pt has been off meds for 5 months now and tremor is the same. He is primarily right handed.    Objective:  Neurological Exam  Physical Exam Physical Examination:   Vitals:   08/11/20 1023  BP: 136/72  Pulse: (!) 56  SpO2: 97%    General Examination: The patient is a very pleasant 69 y.o. male in no acute distress. He appears well-developed and well-nourished and well groomed.   HEENT: Normocephalic, atraumatic, pupils are equal, round and reactive to light and accommodation.  Extraocular tracking is well preserved, no nystagmus, corrective eyeglasses in place.  Face is symmetric, no telltale facial masking noted, no significant hypophonia or voice tremor, no lip, neck or jaw tremor, slight nuchal rigidity perhaps.  No carotid bruits.  Airway examination shows mild airway crowding, tongue protrudes centrally and palate elevates symmetrically, hearing grossly intact.    Chest: Clear to auscultation without wheezing, rhonchi or crackles noted.  Heart: S1+S2+0, regular and normal without murmurs, rubs or gallops noted.   Abdomen: Soft, non-tender and non-distended with normal bowel sounds appreciated on auscultation.  Extremities: There is no pitting edema in the distal lower extremities bilaterally.  Skin: Warm and dry without trophic changes noted.  Musculoskeletal: exam reveals no obvious joint deformities, tenderness or joint swelling or erythema.   Neurologically:  Mental status: The patient is awake, alert and oriented in all  4 spheres. His immediate and remote memory, attention, language skills and fund of knowledge are appropriate. There is no evidence of aphasia, agnosia, apraxia or anomia. Speech is clear with normal prosody and enunciation. Thought process is linear. Mood is normal and affect is normal.  Cranial nerves II - XII are as described above under HEENT exam. In addition: shoulder shrug is normal with equal shoulder height noted. Motor exam: Normal bulk, strength. There is no drift, rebound.  He has a slight increase in tone in the right upper extremity.  He has an intermittent mild tremor at rest in the right upper extremity, no left upper extremity tremor, no lower extremity tremor.  He has no significant postural or action tremor, no intention tremor.  On fine motor testing with finger taps, he has slight decrease with finger taps on the right, normal on the left, foot taps are slightly impaired bilaterally, hand movements and rapid alternating patting is fairly benign bilaterally.  On 08/11/2020: On Archimedes spiral drawing he has no significant trembling with the right hand which is his dominant hand, he has some insecurity with the left hand, no significant trembling.  Handwriting is legible, not tremulous, not micrographic.  Romberg is negative. Reflexes are 2+ throughout. Babinski: Toes are flexor bilaterally.  Cerebellar testing: No dysmetria or intention tremor on finger to nose testing. Heel to shin is unremarkable bilaterally. There is no truncal or gait ataxia.  Sensory exam: intact to light touch in the upper and lower extremities.  Gait, station and balance: He stands without difficulty, he is able to stand narrow based.  His posture is slightly stooped for age.  He walks with good stride length and pace, decreased arm swing on the right.   Assessment and Plan:   In summary, Alexander Bowen is a very pleasant 69 y.o.-year old male with an underlying medical history of SVT, PAF, on Eliquis, PVCs  and PACs, hypertension, hyperlipidemia, recurrent kidney stones, and reflux disease, who presents for evaluation of his tremor disorder.  He has noted a right hand tremor since approximately October 2020.  The tremor did not change once he was able to stop his antiarrhythmic medications.  In fact, with time it has become a little bit more persistent.  His history and examination are supportive of mild parkinsonism affecting his right side.  Findings are on the rather mild side and he is reassured in that regard.  We can continue to monitor his symptoms and examination.  I would like to proceed with a special brain scan called DaTscan which is a nuclear medicine scan.  I explained this type of scan to him in detail.  I do not believe we have any urgency to pursue a brain MRI at this time but a DaTscan may help narrow down the diagnosis.  He is advised that a DaTscan is not a definitive test for Parkinson's disease.  He is advised to continue with his healthy lifestyle, good nutrition, good hydration and exercise.  He is agreeable to pursuing the scan, we will keep him posted as to the scheduling of the scan.  Written informed consent was obtained today.  He is advised that I would like to see him back routinely in 3 months or after the scan.  We will call him with the results as well.  I answered all his questions today and he was in agreement.   Thank you very much for allowing me to participate in the care of this nice patient. If I can be of any further assistance to you please do not hesitate to call me at [phone].  Sincerely,   Star Age, MD, PhD

## 2020-08-11 NOTE — Patient Instructions (Signed)
I think you have signs and symptoms of mild parkinsonism, possibly early right sided Parkinson's disease.   Please continue with your healthy lifestyle.  I think you have done a great job working on your [employer] and improving your overall health and taking care of your cardiac health.  If recommended also by your primary care physician or cardiologist, we can reevaluate you for sleep apnea as there is a connection between sleep apnea and A. Fib.  I do want to suggest a few things today:  Remember to drink plenty of fluid at least 6 glasses (8 oz each), eat healthy meals and do not skip any meals. Try to eat protein with a every meal and eat a healthy snack such as fruit or nuts in between meals. Try to keep a regular sleep-wake schedule and try to exercise daily, particularly in the form of walking, 20-30 minutes a day, if you can.   Try to stay active physically and mentally.   As far as diagnostic testing, as discussed, I would like to proceed with a DaT scan: This is a specialized brain scan designed to help with diagnosis of tremor disorders. A radioactive marker gets injected and the uptake is measured in the brain and compared to normal controls and right side is compared to the left, a change in uptake can help with diagnosis of certain tremor disorders. A brain MRI on the other hand is a brain scan that helps look at the brain structure in more detail overall and look for age-related changes, blood vessel related changes and look for stroke and volume loss which we call atrophy.   In preparation for the scan, we will check your kidney and liver function today.   I would like to see you back in about 3 months or sooner after the scan.    Our phone number is [phone]. We also have an after hours call service for urgent matters and there is a physician on-call for urgent questions, that cannot wait till the next work day. For any emergencies you know to call 911 or go to the nearest emergency  room.

## 2020-08-12 DIAGNOSIS — I482 Chronic atrial fibrillation, unspecified: Secondary | ICD-10-CM | POA: Diagnosis not present

## 2020-08-12 DIAGNOSIS — I471 Supraventricular tachycardia: Secondary | ICD-10-CM | POA: Diagnosis not present

## 2020-08-12 DIAGNOSIS — E782 Mixed hyperlipidemia: Secondary | ICD-10-CM | POA: Diagnosis not present

## 2020-08-12 DIAGNOSIS — I4891 Unspecified atrial fibrillation: Secondary | ICD-10-CM | POA: Diagnosis not present

## 2020-09-12 DIAGNOSIS — Z125 Encounter for screening for malignant neoplasm of prostate: Secondary | ICD-10-CM | POA: Diagnosis not present

## 2020-09-12 DIAGNOSIS — E782 Mixed hyperlipidemia: Secondary | ICD-10-CM | POA: Diagnosis not present

## 2020-09-12 DIAGNOSIS — I4891 Unspecified atrial fibrillation: Secondary | ICD-10-CM | POA: Diagnosis not present

## 2020-09-12 DIAGNOSIS — I471 Supraventricular tachycardia: Secondary | ICD-10-CM | POA: Diagnosis not present

## 2020-09-12 DIAGNOSIS — I482 Chronic atrial fibrillation, unspecified: Secondary | ICD-10-CM | POA: Diagnosis not present

## 2020-09-15 ENCOUNTER — Telehealth: Payer: Self-pay | Admitting: *Deleted

## 2020-09-15 ENCOUNTER — Encounter (HOSPITAL_COMMUNITY)
Admission: RE | Admit: 2020-09-15 | Discharge: 2020-09-15 | Disposition: A | Discharge status: Home / Self Care | Payer: Medicare Other | Source: Ambulatory Visit | Attending: Neurology | Admitting: Neurology

## 2020-09-15 ENCOUNTER — Ambulatory Visit (HOSPITAL_COMMUNITY)
Admission: RE | Admit: 2020-09-15 | Discharge: 2020-09-15 | Disposition: A | Discharge status: Home / Self Care | Payer: Medicare Other | Source: Ambulatory Visit | Attending: Neurology | Admitting: Neurology

## 2020-09-15 ENCOUNTER — Other Ambulatory Visit: Payer: Self-pay

## 2020-09-15 DIAGNOSIS — G20 Parkinson's disease: Secondary | ICD-10-CM | POA: Diagnosis not present

## 2020-09-15 DIAGNOSIS — R251 Tremor, unspecified: Secondary | ICD-10-CM

## 2020-09-15 MED ORDER — POTASSIUM IODIDE (ANTIDOTE) 130 MG PO TABS
ORAL_TABLET | ORAL | Status: AC
  Administered 2020-09-15: 130 mg via ORAL
  Filled 2020-09-15: qty 1

## 2020-09-15 MED ORDER — POTASSIUM IODIDE (ANTIDOTE) 130 MG PO TABS
130.0000 mg | ORAL_TABLET | Freq: Once | ORAL | Status: AC
  Administered 2020-09-15: 130 mg via ORAL

## 2020-09-15 MED ORDER — IOFLUPANE I 123 185 MBQ/2.5ML IV SOLN
4.8000 | Freq: Once | INTRAVENOUS | Status: AC
  Administered 2020-09-15: 4.8 via INTRAVENOUS

## 2020-09-15 NOTE — Telephone Encounter (Signed)
LVM requesting call back Monday for DAT scan results.

## 2020-09-15 NOTE — Progress Notes (Signed)
Please call patient regarding the recent nuclear medicine DaT scan result. As discussed, this is a specialized brain scan designed to help with diagnosis of tremor disorders, including parkinsonian disorders. A radioactive marker gets injected and the uptake is measured in the brain and compared to normal controls and right side is compared to the left. A change in uptake can help with diagnosis of certain tremor disorders and narrow down the diagnostic possibilities. The patient's recent scan indicated abnormal (as in lower) uptake as compared to normal uptake pattern indicating an underlying parkinsonian disorder. Again, this is not a definitive test for Parkinson's disease and does not distinguish between Parkinson's disease and other, atypical parkinsonism disorders.  As discussed during the appointment, this test would support his diagnosis of mild right-sided Parkinson's disease.  I would recommend he keep his appointment with me in January as scheduled or if he would like to have a sooner appointment we can certainly try to accommodate.  (By the way, the report indicates patient's age at 35 when it should say 69.  I was not able to send a staff message to the reading physician in epic.)

## 2020-09-16 DIAGNOSIS — E7800 Pure hypercholesterolemia, unspecified: Secondary | ICD-10-CM | POA: Insufficient documentation

## 2020-09-16 DIAGNOSIS — I4891 Unspecified atrial fibrillation: Secondary | ICD-10-CM | POA: Insufficient documentation

## 2020-09-16 DIAGNOSIS — R112 Nausea with vomiting, unspecified: Secondary | ICD-10-CM | POA: Insufficient documentation

## 2020-09-16 DIAGNOSIS — Z9889 Other specified postprocedural states: Secondary | ICD-10-CM | POA: Insufficient documentation

## 2020-09-16 DIAGNOSIS — K219 Gastro-esophageal reflux disease without esophagitis: Secondary | ICD-10-CM | POA: Insufficient documentation

## 2020-09-16 DIAGNOSIS — I10 Essential (primary) hypertension: Secondary | ICD-10-CM | POA: Insufficient documentation

## 2020-09-19 ENCOUNTER — Other Ambulatory Visit: Payer: Self-pay

## 2020-09-19 ENCOUNTER — Ambulatory Visit (INDEPENDENT_AMBULATORY_CARE_PROVIDER_SITE_OTHER): Payer: Medicare Other | Admitting: Cardiology

## 2020-09-19 ENCOUNTER — Encounter: Payer: Self-pay | Admitting: Cardiology

## 2020-09-19 VITALS — BP 120/70 | HR 60 | Ht 70.0 in | Wt 174.0 lb

## 2020-09-19 DIAGNOSIS — I480 Paroxysmal atrial fibrillation: Secondary | ICD-10-CM

## 2020-09-19 DIAGNOSIS — E785 Hyperlipidemia, unspecified: Secondary | ICD-10-CM | POA: Diagnosis not present

## 2020-09-19 DIAGNOSIS — I491 Atrial premature depolarization: Secondary | ICD-10-CM

## 2020-09-19 DIAGNOSIS — G20 Parkinson's disease: Secondary | ICD-10-CM

## 2020-09-19 DIAGNOSIS — G20A1 Parkinson's disease without dyskinesia, without mention of fluctuations: Secondary | ICD-10-CM

## 2020-09-19 DIAGNOSIS — I493 Ventricular premature depolarization: Secondary | ICD-10-CM

## 2020-09-19 DIAGNOSIS — Z23 Encounter for immunization: Secondary | ICD-10-CM | POA: Diagnosis not present

## 2020-09-19 DIAGNOSIS — E782 Mixed hyperlipidemia: Secondary | ICD-10-CM | POA: Diagnosis not present

## 2020-09-19 DIAGNOSIS — Z6825 Body mass index (BMI) 25.0-25.9, adult: Secondary | ICD-10-CM | POA: Diagnosis not present

## 2020-09-19 DIAGNOSIS — I482 Chronic atrial fibrillation, unspecified: Secondary | ICD-10-CM | POA: Diagnosis not present

## 2020-09-19 HISTORY — DX: Parkinson's disease without dyskinesia, without mention of fluctuations: G20.A1

## 2020-09-19 HISTORY — DX: Parkinson's disease: G20

## 2020-09-19 NOTE — Progress Notes (Signed)
Cardiology Office Note:    Date:  09/19/2020   ID:  Alexander Bowen, DOB October 13, 1951, MRN [id number]  PCP:  Maryella Shivers, MD  Cardiologist:  Jenne Campus, MD    Referring MD: Maryella Shivers, MD   Chief Complaint  Patient presents with  . Follow-up  Am doing very well  History of Present Illness:    Alexander Bowen is a 69 y.o. male with past medical history significant for paroxysmal atrial fibrillation, symptomatic PVCs PACs, dyslipidemia.  Comes today to my office for follow-up cardiac wise doing well.  Denies have any palpitations she is taking magnesium and he thinks that is helping.  He try to exercise on the regular basis by walking have no difficulty doing it.  Recently he was seen neurologist.  He was told approximately having Parkinson.  Tremor on the right upper extremities is clearly noticeable.  Past Medical History:  Diagnosis Date  . Atrial fibrillation ([place])   . Atrial flutter ([place]) 09/27/2016   Overview:  Chads score 0, only an aspirin  Formatting of this note might be different from the original. Chads score 0, only an aspirin  . Atypical chest pain 06/30/2018  . Cholelithiasis 02/17/2019  . Drug-induced erectile dysfunction 12/28/2016  . Dyslipidemia 06/11/2017  . Dyspnea on exertion 11/20/2019  . Dysrhythmia 2001   palpitations -checked out by Cardiologist  . GERD (gastroesophageal reflux disease)   . High cholesterol   . Hypertension   . Major neurocognitive disorder due to Parkinson's disease, possible 08/24/2019  . PAC (premature atrial contraction) 05/19/2020  . PAF (paroxysmal atrial fibrillation) ([place]) 12/24/2019  . Palpitations 09/27/2016  . Patella fracture 08/05/2015  . PONV (postoperative nausea and vomiting)   . PVC (premature ventricular contraction) 11/20/2019  . Supraventricular tachycardia ([place]) 09/27/2016  . Tinnitus 12/28/2019    Past Surgical History:  Procedure Laterality Date  . APPENDECTOMY    . CHOLECYSTECTOMY    . LITHOTRIPSY      x 2  . ORIF PATELLA Left 08/05/2015   Procedure: OPEN REDUCTION INTERNAL (ORIF) LEFT  FIXATION PATELLA;  Surgeon: Susa Day, MD;  Location: [facility];  Service: Orthopedics;  Laterality: Left;  . STONE EXTRACTION WITH BASKET     x2  . TONSILLECTOMY      Current Medications: Current Meds  Medication Sig  . carvedilol (COREG) 3.125 MG tablet Take 1 tablet (3.125 mg total) by mouth 2 (two) times daily.  Marland Kitchen diltiazem (CARDIZEM) 30 MG tablet Take 30 mg by mouth as needed. Take if heart rate goes over 120  . ELIQUIS 5 MG TABS tablet TAKE 1 TABLET(5 MG) BY MOUTH TWICE DAILY  . losartan (COZAAR) 50 MG tablet TAKE 1 TABLET(50 MG) BY MOUTH DAILY  . magnesium gluconate (MAGONATE) 500 MG tablet Take 400 mg by mouth 2 (two) times daily.   Marland Kitchen omeprazole (PRILOSEC) 20 MG capsule Take 1 capsule by mouth as needed.  . simvastatin (ZOCOR) 40 MG tablet Take 40 mg by mouth at bedtime.     Allergies:   Patient has no known allergies.   Social History   Socioeconomic History  . Marital status: Married    Spouse name: Not on file  . Number of children: Not on file  . Years of education: Not on file  . Highest education level: Not on file  Occupational History  . Not on file  Tobacco Use  . Smoking status: Former Smoker    Packs/day: 1.50    Years: 20.00  Pack years: 30.00    Types: Cigarettes    Quit date: 03/23/1989    Years since quitting: 31.5  . Smokeless tobacco: Never Used  Vaping Use  . Vaping Use: Never used  Substance and Sexual Activity  . Alcohol use: Yes    Comment: occassionally  . Drug use: No  . Sexual activity: Not on file  Other Topics Concern  . Not on file  Social History Narrative  . Not on file   Social Determinants of Health   Financial Resource Strain:   . Difficulty of Paying Living Expenses: Not on file  Food Insecurity:   . Worried About Charity fundraiser in the Last Year: Not on file  . Ran Out of Food in the Last Year: Not on file  Transportation  Needs:   . Lack of Transportation (Medical): Not on file  . Lack of Transportation (Non-Medical): Not on file  Physical Activity:   . Days of Exercise per Week: Not on file  . Minutes of Exercise per Session: Not on file  Stress:   . Feeling of Stress : Not on file  Social Connections:   . Frequency of Communication with Friends and Family: Not on file  . Frequency of Social Gatherings with Friends and Family: Not on file  . Attends Religious Services: Not on file  . Active Member of Clubs or Organizations: Not on file  . Attends Archivist Meetings: Not on file  . Marital Status: Not on file     Family History: The patient's family history includes Dementia in his father; Diabetes in his father; Heart disease in his mother; Heart failure in his mother; Prostate cancer in his father. ROS:   Please see the history of present illness.    All 14 point review of systems negative except as described per history of present illness  EKGs/Labs/Other Studies Reviewed:      Recent Labs: 10/01/2019: Magnesium 2.3; TSH 1.230 10/28/2019: BUN 14; Creatinine, Ser 1.10; NT-Pro BNP 171; Potassium 5.3; Sodium 140  Recent Lipid Panel No results found for: CHOL, TRIG, HDL, CHOLHDL, VLDL, LDLCALC, LDLDIRECT  Physical Exam:    VS:  BP 120/70 (BP Location: Right Arm, Patient Position: Sitting, Cuff Size: Normal)   Pulse 60   Ht 5\' 10"  (1.778 m)   Wt 174 lb (78.9 kg)   SpO2 97%   BMI 24.97 kg/m     Wt Readings from Last 3 Encounters:  09/19/20 174 lb (78.9 kg)  08/11/20 171 lb (77.6 kg)  06/15/20 172 lb 3.2 oz (78.1 kg)     GEN:  Well nourished, well developed in no acute distress HEENT: Normal NECK: No JVD; No carotid bruits LYMPHATICS: No lymphadenopathy CARDIAC: RRR, no murmurs, no rubs, no gallops RESPIRATORY:  Clear to auscultation without rales, wheezing or rhonchi  ABDOMEN: Soft, non-tender, non-distended MUSCULOSKELETAL:  No edema; No deformity  SKIN: Warm and  dry LOWER EXTREMITIES: no swelling NEUROLOGIC:  Alert and oriented x 3 PSYCHIATRIC:  Normal affect   ASSESSMENT:    1. Paroxysmal atrial fibrillation ([facility])   2. PAF (paroxysmal atrial fibrillation) ([place])   3. PAC (premature atrial contraction)   4. PVC (premature ventricular contraction)   5. Parkinson disease ([place])   6. Dyslipidemia    PLAN:    In order of problems listed above:  1. Paroxysmal atrial fibrillation denies having palpitations, anticoagulated which I will continue. 2. PVC and PACs, successfully suppressed with medication doing well from that point review,  will continue present management. 3. Parkinson's disease: Recent discovery.  Follow-up with neurology has been arranged.  I did review neurology note for this visit. 4. Dyslipidemia: I did check his K PN however did not show me latest data.  To her primary care physician to get her fasting lipid profile.   Medication Adjustments/Labs and Tests Ordered: Current medicines are reviewed at length with the patient today.  Concerns regarding medicines are outlined above.  Orders Placed This Encounter  Procedures  . EKG 12-Lead   Medication changes: No orders of the defined types were placed in this encounter.   Signed, Park Liter, MD, [facility] 09/19/2020 11:39 AM    [place]

## 2020-09-19 NOTE — Patient Instructions (Signed)
Medication Instructions:  Your physician recommends that you continue on your current medications as directed. Please refer to the Current Medication list given to you today.  *If you need a refill on your cardiac medications before your next appointment, please call your pharmacy*   Lab Work: NONE If you have labs (blood work) drawn today and your tests are completely normal, you will receive your results only by: MyChart Message (if you have MyChart) OR A paper copy in the mail If you have any lab test that is abnormal or we need to change your treatment, we will call you to review the results.   Testing/Procedures: NONE   Follow-Up: At [facility], you and your health needs are our priority.  As part of our continuing mission to provide you with exceptional heart care, we have created designated Provider Care Teams.  These Care Teams include your primary Cardiologist (physician) and Advanced Practice Providers (APPs -  Physician Assistants and Nurse Practitioners) who all work together to provide you with the care you need, when you need it.  We recommend signing up for the patient portal called "MyChart".  Sign up information is provided on this After Visit Summary.  MyChart is used to connect with patients for Virtual Visits (Telemedicine).  Patients are able to view lab/test results, encounter notes, upcoming appointments, etc.  Non-urgent messages can be sent to your provider as well.   To learn more about what you can do with MyChart, go to [email or web address].    Your next appointment:   5 month(s)  The format for your next appointment:   In Person  Provider:   Robert Krasowski, MD    Other Instructions   

## 2020-09-20 NOTE — Telephone Encounter (Addendum)
Called patient and informed him I was calling with DAT scan results. He stated he saw results and DR Athar's result note in my chart. He had no questions. He asked if she would prescribe a medicine for his tremors now that the DAT scan results are back. I advised will send her his request, call him if she has any other instructions for him. Patient verbalized understanding, appreciation.

## 2020-09-21 ENCOUNTER — Encounter: Payer: Self-pay | Admitting: *Deleted

## 2020-09-21 MED ORDER — ROPINIROLE HCL 0.25 MG PO TABS: ORAL_TABLET | ORAL | 2 refills | Status: DC

## 2020-09-21 NOTE — Addendum Note (Signed)
Addended by: Star Age on: 09/21/2020 08:26 AM   Modules accepted: Orders

## 2020-09-21 NOTE — Telephone Encounter (Signed)
Called patient and reviewed Dr Guadelupe Sabin message entirely. Advised he call for any questions, concerns.  I advised will also send information via my chart. Patient verbalized understanding, appreciation. My chart sent.

## 2020-09-21 NOTE — Telephone Encounter (Signed)
Please call patient back:  I think it would be appropriate to start low-dose medication.  To that end, I suggest he start a medication called Requip (generic name: ropinirole) 0.25 mg: Take one pill twice daily (morning and lunchtime) for one week, then one pill 3 times a day (morning, lunch and evening) for one week, then 2 pills 3 times a day for one week, then 3 pills three times a day thereafter.   Common side effects reported are: Sedation, sleepiness, nausea, vomiting, and rare side effects are confusion, hallucinations, swelling in legs, and abnormal behaviors, including impulse control problems, which can manifest as excessive eating, obsessions with food or gambling, or hypersexuality.  I will send a prescription into his pharmacy on file.  He can keep his appointment that is on the schedule already.

## 2020-10-12 DIAGNOSIS — I4891 Unspecified atrial fibrillation: Secondary | ICD-10-CM | POA: Diagnosis not present

## 2020-10-12 DIAGNOSIS — E782 Mixed hyperlipidemia: Secondary | ICD-10-CM | POA: Diagnosis not present

## 2020-10-12 DIAGNOSIS — I471 Supraventricular tachycardia: Secondary | ICD-10-CM | POA: Diagnosis not present

## 2020-10-12 DIAGNOSIS — I482 Chronic atrial fibrillation, unspecified: Secondary | ICD-10-CM | POA: Diagnosis not present

## 2020-10-21 DIAGNOSIS — M5136 Other intervertebral disc degeneration, lumbar region: Secondary | ICD-10-CM | POA: Diagnosis not present

## 2020-10-21 DIAGNOSIS — M545 Low back pain, unspecified: Secondary | ICD-10-CM | POA: Diagnosis not present

## 2020-10-31 DIAGNOSIS — L570 Actinic keratosis: Secondary | ICD-10-CM | POA: Diagnosis not present

## 2020-10-31 DIAGNOSIS — C44319 Basal cell carcinoma of skin of other parts of face: Secondary | ICD-10-CM | POA: Diagnosis not present

## 2020-10-31 DIAGNOSIS — D044 Carcinoma in situ of skin of scalp and neck: Secondary | ICD-10-CM | POA: Diagnosis not present

## 2020-10-31 DIAGNOSIS — L578 Other skin changes due to chronic exposure to nonionizing radiation: Secondary | ICD-10-CM | POA: Diagnosis not present

## 2020-11-01 DIAGNOSIS — M5441 Lumbago with sciatica, right side: Secondary | ICD-10-CM | POA: Diagnosis not present

## 2020-11-01 DIAGNOSIS — M545 Low back pain, unspecified: Secondary | ICD-10-CM | POA: Diagnosis not present

## 2020-11-01 DIAGNOSIS — M4036 Flatback syndrome, lumbar region: Secondary | ICD-10-CM | POA: Diagnosis not present

## 2020-11-01 DIAGNOSIS — M4003 Postural kyphosis, cervicothoracic region: Secondary | ICD-10-CM | POA: Diagnosis not present

## 2020-11-09 DIAGNOSIS — M4003 Postural kyphosis, cervicothoracic region: Secondary | ICD-10-CM | POA: Diagnosis not present

## 2020-11-09 DIAGNOSIS — M4036 Flatback syndrome, lumbar region: Secondary | ICD-10-CM | POA: Diagnosis not present

## 2020-11-09 DIAGNOSIS — M545 Low back pain, unspecified: Secondary | ICD-10-CM | POA: Diagnosis not present

## 2020-11-09 DIAGNOSIS — M5441 Lumbago with sciatica, right side: Secondary | ICD-10-CM | POA: Diagnosis not present

## 2020-11-14 ENCOUNTER — Ambulatory Visit: Payer: Medicare Other | Admitting: Neurology

## 2020-11-14 DIAGNOSIS — M4036 Flatback syndrome, lumbar region: Secondary | ICD-10-CM | POA: Diagnosis not present

## 2020-11-14 DIAGNOSIS — M545 Low back pain, unspecified: Secondary | ICD-10-CM | POA: Diagnosis not present

## 2020-11-14 DIAGNOSIS — M4003 Postural kyphosis, cervicothoracic region: Secondary | ICD-10-CM | POA: Diagnosis not present

## 2020-11-14 DIAGNOSIS — M5441 Lumbago with sciatica, right side: Secondary | ICD-10-CM | POA: Diagnosis not present

## 2020-11-15 ENCOUNTER — Ambulatory Visit (INDEPENDENT_AMBULATORY_CARE_PROVIDER_SITE_OTHER): Payer: Medicare Other | Admitting: Neurology

## 2020-11-15 ENCOUNTER — Encounter: Payer: Self-pay | Admitting: Neurology

## 2020-11-15 VITALS — BP 157/75 | HR 59 | Ht 70.0 in | Wt 179.0 lb

## 2020-11-15 DIAGNOSIS — G20 Parkinson's disease: Secondary | ICD-10-CM | POA: Diagnosis not present

## 2020-11-15 MED ORDER — ROPINIROLE HCL 1 MG PO TABS
ORAL_TABLET | ORAL | 5 refills | Status: DC
Start: 2020-11-15 — End: 2020-12-20

## 2020-11-15 NOTE — Progress Notes (Signed)
Subjective:    Patient ID: Alexander Bowen is a 70 y.o. male.  HPI     Interim history:   Alexander Bowen is a 70 year old right-handed gentleman with an underlying medical history of SVT, PAF, on Eliquis, PVCs and PACs, hypertension, hyperlipidemia, recurrent kidney stones, and reflux disease, who Presents for follow-up consultation of his parkinsonism, likely early right-sided Parkinson's disease.  The patient is accompanied by his wife, Alexander Bowen, today. I first met him at the request of his primary care physician on 08/11/2020, at which time he reported a right hand tremor for the past year.  Examination was in keeping with mild right-sided parkinsonism.  I advised him to proceed with a DaTscan.  He had an interim DaTscan on 09/15/2020 and I reviewed the results: IMPRESSION: Asymmetric decreased activity of the LEFT putamen is suggestive of early Parkinsonian syndrome pathology.   Of note, DaTSCAN is not diagnostic of Parkinsonian syndromes, which remains a clinical diagnosis. DaTscan is an adjuvant test to aid in the clinical diagnosis of Parkinsonian syndromes.   We also called him with his test results.  He was agreeable to starting medication for his tremor and I suggested a trial of low-dose Requip with gradual titration.  Today, 11/15/2020 (all dictated new, as well as above notes, some dictation done in note pad or Word, outside of chart, may appear as copied):   He reports feeling about the same, he has noticed a very minute response to his tremor with the ropinirole.  He tolerates it well.  In the very first few days, he had some side effects which were minor and not sustained.  He had a recent checkup with his cardiologist.  He has no issues with balance or cognitive function or sleep per se.  He has no recent issues with constipation, he does take magnesium 400 mg twice daily.  He tries to exercise regularly in the form of walking and he is planning to join the gym again for strength  training and cardio. He also likes to ride the bike when they go to the beach. He has not had any recent falls. He had a recent issue with back pain and saw Dr. Tonita Cong for this. He was given a short course of oral steroids which helped. He had low back pain radiating to the right leg.  The patient's allergies, current medications, family history, past medical history, past social history, past surgical history and problem list were reviewed and updated as appropriate.   Previously (copied from previous notes for reference):   08/11/20: (He) reports a right hand tremor for the past 1 year. I reviewed your office note from 05/30/2020.  His symptoms started approximately in October 2020.  At the time, he was going through different medication trials for his heart arrhythmias.  He tried flecainide, then propafenone, and mexiletine but eventually came off of all medications.  Since approximately mid May of this year he has been off of mexiletine which was the last medication he was on.  He has been followed by cardiology on a regular basis.  His tremor started in the right hand and was initially intermittent but has progressed.  He initially thought that 1 of these medications was the culprit but even after stopping them he had an ongoing tremor and in fact it has progressed a little bit in that it is more frequent in occurrence.  It is often at rest.  It does not actually cause him to have any difficulty performing fine motor  tasks.  He does not have any significant tremor while feeding himself for using his dominant hand to shave.  He has not noticed any tremor on the left side or lower extremities.  He has not had any balance problems and no falls.  He does endorse shoulder discomfort for the past 3 years on the right side.  He does not have a family history for Parkinson's disease but his dad had a tremor, perhaps only on one side, the patient is not completely sure, dad lived to be 65.  Mom lived to be 57.  The  patient has 2 brothers.  He lives with his wife, he is retired, worked in a [employer] operational officer, no kids.  He quit smoking in 1991 and quit drinking alcohol or caffeine when he was first noted to have A. fib in February 2021.  The tremor seems to be a little bit worse first thing in the morning and as the day progresses it seems to be better or less noticeable.  He had a home sleep test _0 years ago and at that time he did not have any obstructive sleep apnea.  His Past Medical History Is Significant For: Past Medical History:  Diagnosis Date  . Atrial fibrillation ([place])   . Atrial flutter ([place]) 09/27/2016   Overview:  Chads score 0, only an aspirin  Formatting of this note might be different from the original. Chads score 0, only an aspirin  . Atypical chest pain 06/30/2018  . Cholelithiasis 02/17/2019  . Drug-induced erectile dysfunction 12/28/2016  . Dyslipidemia 06/11/2017  . Dyspnea on exertion 11/20/2019  . Dysrhythmia 2001   palpitations -checked out by Cardiologist  . GERD (gastroesophageal reflux disease)   . High cholesterol   . Hypertension   . Major neurocognitive disorder due to Parkinson's disease, possible 08/24/2019  . PAC (premature atrial contraction) 05/19/2020  . PAF (paroxysmal atrial fibrillation) ([place]) 12/24/2019  . Palpitations 09/27/2016  . Patella fracture 08/05/2015  . PONV (postoperative nausea and vomiting)   . PVC (premature ventricular contraction) 11/20/2019  . Supraventricular tachycardia ([place]) 09/27/2016  . Tinnitus 12/28/2019    His Past Surgical History Is Significant For: Past Surgical History:  Procedure Laterality Date  . APPENDECTOMY    . CHOLECYSTECTOMY    . LITHOTRIPSY     x 2  . ORIF PATELLA Left 08/05/2015   Procedure: OPEN REDUCTION INTERNAL (ORIF) LEFT  FIXATION PATELLA;  Surgeon: Susa Day, MD;  Location: [facility];  Service: Orthopedics;  Laterality: Left;  . STONE EXTRACTION WITH BASKET     x2  . TONSILLECTOMY      His Family History Is  Significant For: Family History  Problem Relation Age of Onset  . Heart disease Mother   . Heart failure Mother   . Diabetes Father   . Prostate cancer Father   . Dementia Father     His Social History Is Significant For: Social History   Socioeconomic History  . Marital status: Married    Spouse name: Not on file  . Number of children: Not on file  . Years of education: Not on file  . Highest education level: Not on file  Occupational History  . Not on file  Tobacco Use  . Smoking status: Former Smoker    Packs/day: 1.50    Years: 20.00    Pack years: 30.00    Types: Cigarettes    Quit date: 03/23/1989    Years since quitting: 31.6  . Smokeless  tobacco: Never Used  Vaping Use  . Vaping Use: Never used  Substance and Sexual Activity  . Alcohol use: Yes    Comment: occassionally  . Drug use: No  . Sexual activity: Not on file  Other Topics Concern  . Not on file  Social History Narrative  . Not on file   Social Determinants of Health   Financial Resource Strain: Not on file  Food Insecurity: Not on file  Transportation Needs: Not on file  Physical Activity: Not on file  Stress: Not on file  Social Connections: Not on file    His Allergies Are:  No Known Allergies:   His Current Medications Are:  Outpatient Encounter Medications as of 11/15/2020  Medication Sig  . carvedilol (COREG) 3.125 MG tablet Take 1 tablet (3.125 mg total) by mouth 2 (two) times daily.  Marland Kitchen diltiazem (CARDIZEM) 30 MG tablet Take 30 mg by mouth as needed. Take if heart rate goes over 120  . ELIQUIS 5 MG TABS tablet TAKE 1 TABLET(5 MG) BY MOUTH TWICE DAILY  . losartan (COZAAR) 50 MG tablet TAKE 1 TABLET(50 MG) BY MOUTH DAILY  . magnesium gluconate (MAGONATE) 500 MG tablet Take 400 mg by mouth 2 (two) times daily.   Marland Kitchen omeprazole (PRILOSEC) 20 MG capsule Take 1 capsule by mouth as needed.  Marland Kitchen rOPINIRole (REQUIP) 0.25 MG tablet Take 1 pill twice daily x 1 week, then 1 pill 3 times daily x 1  week, then 2 pills 3 times daily, for one week, then 3 pills 3 times daily (Patient taking differently: Take 0.75 mg by mouth 3 (three) times daily. Take 1 pill twice daily x 1 week, then 1 pill 3 times daily x 1 week, then 2 pills 3 times daily, for one week, then 3 pills 3 times daily)  . simvastatin (ZOCOR) 40 MG tablet Take 40 mg by mouth at bedtime.   No facility-administered encounter medications on file as of 11/15/2020.  :  Review of Systems:  Out of a complete 14 point review of systems, all are reviewed and negative with the exception of these symptoms as listed below: Review of Systems  Neurological:       Pt presents today to follow up on his PD. Requip helps control his symptoms some but not completely.    Objective:  Neurological Exam  Physical Exam Physical Examination:   Vitals:   11/15/20 1356  BP: (!) 157/75  Pulse: (!) 59   General Examination: The patient is a very pleasant 70 y.o. male in no acute distress. He appears well-developed and well-nourished and well groomed.   HEENT: Normocephalic, atraumatic, pupils are equal, round and reactive to light, tracking is well preserved, no nystagmus, corrective eyeglasses in place. He has hearing aids. Face is symmetric, no telltale facial masking noted, no significant hypophonia or voice tremor, no lip, neck or jaw tremor, slight nuchal rigidity perhaps.  No carotid bruits.  Airway examination shows mild airway crowding, tongue protrudes centrally and palate elevates symmetrically, hearing grossly intact.    Chest: Clear to auscultation without wheezing, rhonchi or crackles noted.  Heart: S1+S2+0, regular and normal without murmurs, rubs or gallops noted.   Abdomen: Soft, non-tender and non-distended.  Extremities: There is no pitting edema in the distal lower extremities bilaterally.  Skin: Warm and dry without trophic changes noted.  Musculoskeletal: exam reveals no obvious joint deformities, tenderness or  joint swelling or erythema.   Neurologically:  Mental status: The patient is awake, alert and  oriented in all 4 spheres. His immediate and remote memory, attention, language skills and fund of knowledge are appropriate. There is no evidence of aphasia, agnosia, apraxia or anomia. Speech is clear with normal prosody and enunciation. Thought process is linear. Mood is normal and affect is normal.  Cranial nerves II - XII are as described above under HEENT exam. In addition: shoulder shrug is normal with equal shoulder height noted. Motor exam: Normal bulk, strength. There is no drift, rebound.  He has a slight increase in tone in the right upper extremity.  He has a mild tremor at rest in the right upper extremity, no left upper extremity tremor, no lower extremity tremor.  He has no significant postural or action tremor, no intention tremor. Mild impairment of fine motor skills noted in the right upper extremity.  On 08/11/2020: On Archimedes spiral drawing he has no significant trembling with the right hand which is his dominant hand, he has some insecurity with the left hand, no significant trembling.  Handwriting is legible, not tremulous, not micrographic.  Cerebellar testing: No dysmetria or intention tremor. There is no truncal or gait ataxia.  Sensory exam: intact to light touch in the upper and lower extremities.  Gait, station and balance: He stands without difficulty, he is able to stand narrow based.  His posture is slightly stooped for age.  He walks with good stride length and pace, decreased arm swing on the right.   Assessment and Plan:   In summary, Alexander Bowen is a very pleasant 70 year old male with an underlying medical history of SVT, PAF, on Eliquis, PVCs and PACs, hypertension, hyperlipidemia, recurrent kidney stones, and reflux disease, who presents for follow-up consultation of his parkinsonism. He started noticing a right hand tremor approximately in October 2020. His  history and examination are supportive of right-sided Parkinson's disease, likely tremor predominant. His DaTscan from 09/15/20 showed asymmetric decreased activity of the LEFT putamen, suggestive of early Parkinsonian syndrome pathology. He started low-dose ropinirole with gradual titration in November 2021. He has tolerated the medication, currently on 0.75 mg 3 times daily without obvious side effects but no significant or telltale results. He may have noticed a minute reduction in his tremor from time to time from the medication. He is willing to increase it. We talked about the importance of pursuing healthy lifestyle, having a positive outlook on things, staying active mentally and physically. He is encouraged to continue to exercise on a regular basis. He may want to look into Parkinson's disease specific classes such as spinning classes or [employer] boxing. He is advised to increase the ropinirole to 1 mg pills, take 1 pill 3 times daily for the next 2 weeks, then increase to 2 pills 3 times daily thereafter. We can certainly increase it after that, he is advised to call us or email [place] if need be. He is encouraged to keep [place] posted and call us also with any interim questions or concerns or email [place] through [place]. His exam is stable. His wife also had additional questions which I answered. The patient is advised to follow-up routinely in 6 months in this office, sooner if needed. I answered all the questions today the patient and his wife are in agreement. I spent 40 minutes in total face-to-face time and in reviewing records during pre-charting, more than 50% of which was spent in counseling and coordination of care, reviewing test results, reviewing medications and treatment regimen and/or in discussing or reviewing the diagnosis  of PD, the prognosis and treatment options. Pertinent laboratory and imaging test results that were available during this visit with the patient were reviewed by me and  considered in my medical decision making (see chart for details).

## 2020-11-15 NOTE — Patient Instructions (Signed)
It was nice to see you again today and nice to meet your wife.  Your exam is stable.  I am pleased to hear that you had just a little bit of response to ropinirole, which is currently low dose.  As explained, I would like to increase ropinirole to 1 mg strength, take 1 pill 3 times a day for the next 2 weeks and then increase it to 2 pills 3 times daily thereafter. Your prescription will say take 2 pills 3 times daily just for ease of filling it, as we had some difficulty when you renewed your prescription the first time.  If need be, we can go up to 3 pills 3 times daily, which will need a new prescription.  Keep [place] posted by phone call or email messaging through MyChart.  Please continue to strive for a healthy lifestyle, positive outlook on things, stay active mentally and physically.  Try to hydrate well with water.  Follow-up in 6 months, sooner if needed.  As a recap, Requip side effects may include: Sedation, sleepiness, nausea, vomiting, and rare side effects are confusion, hallucinations, swelling in legs, and abnormal behaviors, including impulse control problems, which can manifest as excessive eating, obsessions with food or gambling, or hypersexuality.

## 2020-11-18 DIAGNOSIS — M5441 Lumbago with sciatica, right side: Secondary | ICD-10-CM | POA: Diagnosis not present

## 2020-11-18 DIAGNOSIS — M4003 Postural kyphosis, cervicothoracic region: Secondary | ICD-10-CM | POA: Diagnosis not present

## 2020-11-18 DIAGNOSIS — M4036 Flatback syndrome, lumbar region: Secondary | ICD-10-CM | POA: Diagnosis not present

## 2020-11-18 DIAGNOSIS — M545 Low back pain, unspecified: Secondary | ICD-10-CM | POA: Diagnosis not present

## 2020-11-23 DIAGNOSIS — M4036 Flatback syndrome, lumbar region: Secondary | ICD-10-CM | POA: Diagnosis not present

## 2020-11-23 DIAGNOSIS — M4003 Postural kyphosis, cervicothoracic region: Secondary | ICD-10-CM | POA: Diagnosis not present

## 2020-11-23 DIAGNOSIS — M545 Low back pain, unspecified: Secondary | ICD-10-CM | POA: Diagnosis not present

## 2020-11-23 DIAGNOSIS — M5441 Lumbago with sciatica, right side: Secondary | ICD-10-CM | POA: Diagnosis not present

## 2020-12-13 DIAGNOSIS — M545 Low back pain, unspecified: Secondary | ICD-10-CM | POA: Diagnosis not present

## 2020-12-13 DIAGNOSIS — E782 Mixed hyperlipidemia: Secondary | ICD-10-CM | POA: Diagnosis not present

## 2020-12-13 DIAGNOSIS — M4036 Flatback syndrome, lumbar region: Secondary | ICD-10-CM | POA: Diagnosis not present

## 2020-12-13 DIAGNOSIS — M4003 Postural kyphosis, cervicothoracic region: Secondary | ICD-10-CM | POA: Diagnosis not present

## 2020-12-13 DIAGNOSIS — M5441 Lumbago with sciatica, right side: Secondary | ICD-10-CM | POA: Diagnosis not present

## 2020-12-13 DIAGNOSIS — I471 Supraventricular tachycardia: Secondary | ICD-10-CM | POA: Diagnosis not present

## 2020-12-13 DIAGNOSIS — I4891 Unspecified atrial fibrillation: Secondary | ICD-10-CM | POA: Diagnosis not present

## 2020-12-13 DIAGNOSIS — I482 Chronic atrial fibrillation, unspecified: Secondary | ICD-10-CM | POA: Diagnosis not present

## 2020-12-14 ENCOUNTER — Encounter: Payer: Self-pay | Admitting: Neurology

## 2020-12-20 ENCOUNTER — Other Ambulatory Visit: Payer: Self-pay | Admitting: Cardiology

## 2020-12-20 ENCOUNTER — Other Ambulatory Visit: Payer: Self-pay

## 2020-12-20 MED ORDER — ROPINIROLE HCL 1 MG PO TABS: ORAL_TABLET | ORAL | 5 refills | Status: DC

## 2020-12-20 NOTE — Telephone Encounter (Signed)
Refill for Losartan 50 mg to pharmacy

## 2021-01-10 DIAGNOSIS — I471 Supraventricular tachycardia: Secondary | ICD-10-CM | POA: Diagnosis not present

## 2021-01-10 DIAGNOSIS — E782 Mixed hyperlipidemia: Secondary | ICD-10-CM | POA: Diagnosis not present

## 2021-01-10 DIAGNOSIS — I4891 Unspecified atrial fibrillation: Secondary | ICD-10-CM | POA: Diagnosis not present

## 2021-01-10 DIAGNOSIS — I482 Chronic atrial fibrillation, unspecified: Secondary | ICD-10-CM | POA: Diagnosis not present

## 2021-02-03 IMAGING — NM NM DATSCAN
2 series · 12 of 12 positions shown · non-contrast
Comparison: None available

CLINICAL DATA: 5-year-old male with RIGHT hand tremors. Tremors
developed 1 year prior. No gait imbalance.

EXAM:
NUCLEAR MEDICINE BRAIN IMAGING WITH SPECT  (DaTscan )
TECHNIQUE: SPECT images of the brain were obtained after intravenous injection
of radiopharmaceutical. 4 hour post injection imaging. Appropriate
positioning. 130 mg i-STAT given orally for thyroid blockade.
RADIOPHARMACEUTICALS:  4.8 millicuries I 123 Ioflupane

[Series 1: spect - (id) _(id)_cor · 4.1mm · 4.14mm/px · 6 of 128 frames shown]
[frame 11/128]
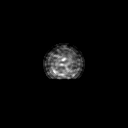
[frame 32/128]
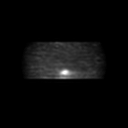
[frame 54/128]
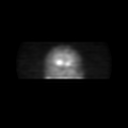
[frame 75/128]
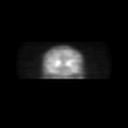
[frame 96/128]
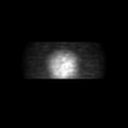
[frame 118/128]
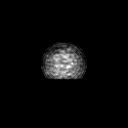

[Series 1: spect - (id) _(id)_tra · 4.1mm · 4.14mm/px · 6 of 128 frames shown]
[frame 11/128]
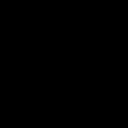
[frame 32/128]
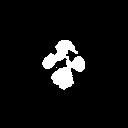
[frame 54/128]
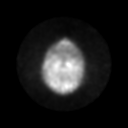
[frame 75/128]
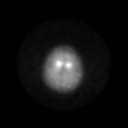
[frame 96/128]
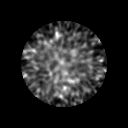
[frame 118/128]
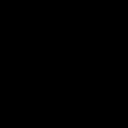

[12 of 12 positions shown; findings below may reference images not displayed]

FINDINGS: There is decreased radiotracer activity within the posterior aspect
of the LEFT putamen. Maintained activity in the head of the LEFT
caudate nucleus. Normal uptake within the RIGHT caudate head and
putamen of the RIGHT striatum.
IMPRESSION: Asymmetric decreased activity of the LEFT putamen is suggestive of
early Parkinsonian syndrome pathology.

Of note, DaTSCAN is not diagnostic of Parkinsonian syndromes, which
remains a clinical diagnosis. DaTscan is an adjuvant test to aid in
the clinical diagnosis of Parkinsonian syndromes.

## 2021-02-10 DIAGNOSIS — E782 Mixed hyperlipidemia: Secondary | ICD-10-CM | POA: Diagnosis not present

## 2021-02-10 DIAGNOSIS — I471 Supraventricular tachycardia: Secondary | ICD-10-CM | POA: Diagnosis not present

## 2021-02-10 DIAGNOSIS — I4891 Unspecified atrial fibrillation: Secondary | ICD-10-CM | POA: Diagnosis not present

## 2021-03-06 ENCOUNTER — Ambulatory Visit: Payer: Medicare Other | Admitting: Cardiology

## 2021-03-06 DIAGNOSIS — Z86008 Personal history of in-situ neoplasm of other site: Secondary | ICD-10-CM | POA: Diagnosis not present

## 2021-03-06 DIAGNOSIS — Z85828 Personal history of other malignant neoplasm of skin: Secondary | ICD-10-CM | POA: Diagnosis not present

## 2021-03-08 ENCOUNTER — Other Ambulatory Visit: Payer: Self-pay

## 2021-03-10 ENCOUNTER — Other Ambulatory Visit: Payer: Self-pay | Admitting: Cardiology

## 2021-03-10 NOTE — Telephone Encounter (Signed)
Refill sent to pharmacy.   

## 2021-03-12 DIAGNOSIS — I471 Supraventricular tachycardia: Secondary | ICD-10-CM | POA: Diagnosis not present

## 2021-03-12 DIAGNOSIS — I4891 Unspecified atrial fibrillation: Secondary | ICD-10-CM | POA: Diagnosis not present

## 2021-03-12 DIAGNOSIS — E782 Mixed hyperlipidemia: Secondary | ICD-10-CM | POA: Diagnosis not present

## 2021-03-14 DIAGNOSIS — E782 Mixed hyperlipidemia: Secondary | ICD-10-CM | POA: Diagnosis not present

## 2021-03-15 ENCOUNTER — Ambulatory Visit (INDEPENDENT_AMBULATORY_CARE_PROVIDER_SITE_OTHER): Payer: Medicare Other | Admitting: Cardiology

## 2021-03-15 ENCOUNTER — Encounter: Payer: Self-pay | Admitting: Cardiology

## 2021-03-15 ENCOUNTER — Other Ambulatory Visit: Payer: Self-pay

## 2021-03-15 VITALS — BP 118/72 | HR 53 | Ht 70.0 in | Wt 180.0 lb

## 2021-03-15 DIAGNOSIS — I480 Paroxysmal atrial fibrillation: Secondary | ICD-10-CM

## 2021-03-15 DIAGNOSIS — R0609 Other forms of dyspnea: Secondary | ICD-10-CM

## 2021-03-15 DIAGNOSIS — R0600 Dyspnea, unspecified: Secondary | ICD-10-CM

## 2021-03-15 DIAGNOSIS — I471 Supraventricular tachycardia: Secondary | ICD-10-CM | POA: Diagnosis not present

## 2021-03-15 DIAGNOSIS — G20 Parkinson's disease: Secondary | ICD-10-CM | POA: Diagnosis not present

## 2021-03-15 NOTE — Progress Notes (Signed)
Cardiology Office Note:    Date:  03/15/2021   ID:  Alexander Bowen, DOB 1951/03/08, MRN [id number]  PCP:  Maryella Shivers, MD  Cardiologist:  Jenne Campus, MD    Referring MD: Maryella Shivers, MD   Chief Complaint  Patient presents with  . Atrial Fibrillation    History of Present Illness:    Alexander Bowen is a 70 y.o. male past medical history significant for paroxysmal atrial fibrillation, symptomatic PVCs, PACs, supraventricular tachycardia, dyslipidemia, Parkinson's.  He comes today 2 months of follow-up overall cardiac wise doing well described to have rare episode of short lasting palpitations.  He does have 1 documented episodes of atrial fibrillation on the monitor.  He does have apple watch.  Denies have any chest pain tightness squeezing pressure burning chest.  Parkison is somewhat difficult to control but he still fairly active.  Past Medical History:  Diagnosis Date  . Atrial fibrillation ([place])   . Atrial flutter ([place]) 09/27/2016   Overview:  Chads score 0, only an aspirin  Formatting of this note might be different from the original. Chads score 0, only an aspirin  . Atypical chest pain 06/30/2018  . Cholelithiasis 02/17/2019  . Drug-induced erectile dysfunction 12/28/2016  . Dyslipidemia 06/11/2017  . Dyspnea on exertion 11/20/2019  . Dysrhythmia 2001   palpitations -checked out by Cardiologist  . GERD (gastroesophageal reflux disease)   . High cholesterol   . Hypertension   . Major neurocognitive disorder due to Parkinson's disease, possible 08/24/2019  . PAC (premature atrial contraction) 05/19/2020  . PAF (paroxysmal atrial fibrillation) ([place]) 12/24/2019  . Palpitations 09/27/2016  . Parkinson disease ([place]) 09/19/2020  . Patella fracture 08/05/2015  . PONV (postoperative nausea and vomiting)   . PVC (premature ventricular contraction) 11/20/2019  . Supraventricular tachycardia ([place]) 09/27/2016  . Tinnitus 12/28/2019    Past Surgical History:  Procedure  Laterality Date  . APPENDECTOMY    . CHOLECYSTECTOMY    . LITHOTRIPSY     x 2  . ORIF PATELLA Left 08/05/2015   Procedure: OPEN REDUCTION INTERNAL (ORIF) LEFT  FIXATION PATELLA;  Surgeon: Susa Day, MD;  Location: [facility];  Service: Orthopedics;  Laterality: Left;  . STONE EXTRACTION WITH BASKET     x2  . TONSILLECTOMY      Current Medications: Current Meds  Medication Sig  . carvedilol (COREG) 3.125 MG tablet TAKE 1 TABLET(3.125 MG) BY MOUTH TWICE DAILY (Patient taking differently: Take 3.125 mg by mouth 2 (two) times daily with a meal.)  . diltiazem (CARDIZEM) 30 MG tablet Take 30 mg by mouth as needed (Afib sx's). Take if heart rate goes over 120  . ELIQUIS 5 MG TABS tablet TAKE 1 TABLET(5 MG) BY MOUTH TWICE DAILY (Patient taking differently: Take 5 mg by mouth 2 (two) times daily.)  . gabapentin (NEURONTIN) 300 MG capsule Take 1 capsule by mouth as needed (Neuropathy).  . losartan (COZAAR) 50 MG tablet TAKE 1 TABLET(50 MG) BY MOUTH DAILY (Patient taking differently: Take 50 mg by mouth daily.)  . magnesium gluconate (MAGONATE) 500 MG tablet Take 400 mg by mouth 2 (two) times daily.   Marland Kitchen omeprazole (PRILOSEC) 20 MG capsule Take 1 capsule by mouth as needed (Indigestion).  Marland Kitchen rOPINIRole (REQUIP) 1 MG tablet Take 2 pills 3 times daily. Follow written instructions provided separately (Patient taking differently: Take 3 mg by mouth in the morning, at noon, and at bedtime. Take 2 pills 3 times daily. Follow written instructions provided separately)  .  simvastatin (ZOCOR) 40 MG tablet Take 40 mg by mouth at bedtime.  . [DISCONTINUED] diltiazem (TIAZAC) 120 MG 24 hr capsule Take 1 capsule by mouth as needed. Afib episodes     Allergies:   Patient has no known allergies.   Social History   Socioeconomic History  . Marital status: Married    Spouse name: Not on file  . Number of children: Not on file  . Years of education: Not on file  . Highest education level: Not on file   Occupational History  . Not on file  Tobacco Use  . Smoking status: Former Smoker    Packs/day: 1.50    Years: 20.00    Pack years: 30.00    Types: Cigarettes    Quit date: 03/23/1989    Years since quitting: 32.0  . Smokeless tobacco: Never Used  Vaping Use  . Vaping Use: Never used  Substance and Sexual Activity  . Alcohol use: Yes    Comment: occassionally  . Drug use: No  . Sexual activity: Not on file  Other Topics Concern  . Not on file  Social History Narrative  . Not on file   Social Determinants of Health   Financial Resource Strain: Not on file  Food Insecurity: Not on file  Transportation Needs: Not on file  Physical Activity: Not on file  Stress: Not on file  Social Connections: Not on file     Family History: The patient's family history includes Dementia in his father; Diabetes in his father; Heart disease in his mother; Heart failure in his mother; Prostate cancer in his father. ROS:   Please see the history of present illness.    All 14 point review of systems negative except as described per history of present illness  EKGs/Labs/Other Studies Reviewed:      Recent Labs: No results found for requested labs within last 8760 hours.  Recent Lipid Panel No results found for: CHOL, TRIG, HDL, CHOLHDL, VLDL, LDLCALC, LDLDIRECT  Physical Exam:    VS:  BP 118/72 (BP Location: Right Arm, Patient Position: Sitting)   Pulse (!) 53   Ht 5\' 10"  (1.778 m)   Wt 180 lb (81.6 kg)   SpO2 98%   BMI 25.83 kg/m     Wt Readings from Last 3 Encounters:  03/15/21 180 lb (81.6 kg)  11/15/20 179 lb (81.2 kg)  09/19/20 174 lb (78.9 kg)     GEN:  Well nourished, well developed in no acute distress HEENT: Normal NECK: No JVD; No carotid bruits LYMPHATICS: No lymphadenopathy CARDIAC: RRR, no murmurs, no rubs, no gallops RESPIRATORY:  Clear to auscultation without rales, wheezing or rhonchi  ABDOMEN: Soft, non-tender, non-distended MUSCULOSKELETAL:  No edema;  No deformity  SKIN: Warm and dry LOWER EXTREMITIES: no swelling NEUROLOGIC:  Alert and oriented x 3 PSYCHIATRIC:  Normal affect   ASSESSMENT:    1. Supraventricular tachycardia ([place])   2. PAF (paroxysmal atrial fibrillation) ([facility])   3. Parkinson disease ([place])   4. Dyspnea on exertion    PLAN:    In order of problems listed above:  1. Supraventricular tachycardia described a very short episode no sustained arrhythmia no recordings. 2. Paroxysmal atrial fibrillation, continue anticoagulation his CHADS2 Vascor equals 2. 3. Parkinson's disease managed by neurology.  Stable. 4. Dyspnea on exertion.  Stable.   Medication Adjustments/Labs and Tests Ordered: Current medicines are reviewed at length with the patient today.  Concerns regarding medicines are outlined above.  No orders of the  defined types were placed in this encounter.  Medication changes: No orders of the defined types were placed in this encounter.   Signed, Park Liter, MD, [facility] 03/15/2021 9:50 AM    [place]

## 2021-03-15 NOTE — Patient Instructions (Signed)

## 2021-03-22 DIAGNOSIS — Z136 Encounter for screening for cardiovascular disorders: Secondary | ICD-10-CM | POA: Diagnosis not present

## 2021-03-22 DIAGNOSIS — E782 Mixed hyperlipidemia: Secondary | ICD-10-CM | POA: Diagnosis not present

## 2021-03-22 DIAGNOSIS — Z139 Encounter for screening, unspecified: Secondary | ICD-10-CM | POA: Diagnosis not present

## 2021-03-22 DIAGNOSIS — Z0000 Encounter for general adult medical examination without abnormal findings: Secondary | ICD-10-CM | POA: Diagnosis not present

## 2021-03-22 DIAGNOSIS — Z1331 Encounter for screening for depression: Secondary | ICD-10-CM | POA: Diagnosis not present

## 2021-03-22 DIAGNOSIS — Z6825 Body mass index (BMI) 25.0-25.9, adult: Secondary | ICD-10-CM | POA: Diagnosis not present

## 2021-03-22 DIAGNOSIS — Z7189 Other specified counseling: Secondary | ICD-10-CM | POA: Diagnosis not present

## 2021-03-22 DIAGNOSIS — I471 Supraventricular tachycardia: Secondary | ICD-10-CM | POA: Diagnosis not present

## 2021-03-22 DIAGNOSIS — I482 Chronic atrial fibrillation, unspecified: Secondary | ICD-10-CM | POA: Diagnosis not present

## 2021-03-22 DIAGNOSIS — Z1339 Encounter for screening examination for other mental health and behavioral disorders: Secondary | ICD-10-CM | POA: Diagnosis not present

## 2021-03-28 DIAGNOSIS — S80811A Abrasion, right lower leg, initial encounter: Secondary | ICD-10-CM | POA: Diagnosis not present

## 2021-04-12 DIAGNOSIS — I482 Chronic atrial fibrillation, unspecified: Secondary | ICD-10-CM | POA: Diagnosis not present

## 2021-04-12 DIAGNOSIS — I471 Supraventricular tachycardia: Secondary | ICD-10-CM | POA: Diagnosis not present

## 2021-04-12 DIAGNOSIS — E782 Mixed hyperlipidemia: Secondary | ICD-10-CM | POA: Diagnosis not present

## 2021-05-05 DIAGNOSIS — M545 Low back pain, unspecified: Secondary | ICD-10-CM | POA: Diagnosis not present

## 2021-05-05 DIAGNOSIS — M5136 Other intervertebral disc degeneration, lumbar region: Secondary | ICD-10-CM | POA: Diagnosis not present

## 2021-05-05 DIAGNOSIS — M5459 Other low back pain: Secondary | ICD-10-CM | POA: Diagnosis not present

## 2021-05-12 DIAGNOSIS — I471 Supraventricular tachycardia: Secondary | ICD-10-CM | POA: Diagnosis not present

## 2021-05-12 DIAGNOSIS — I482 Chronic atrial fibrillation, unspecified: Secondary | ICD-10-CM | POA: Diagnosis not present

## 2021-05-12 DIAGNOSIS — E782 Mixed hyperlipidemia: Secondary | ICD-10-CM | POA: Diagnosis not present

## 2021-05-16 ENCOUNTER — Encounter: Payer: Self-pay | Admitting: Neurology

## 2021-05-16 ENCOUNTER — Ambulatory Visit (INDEPENDENT_AMBULATORY_CARE_PROVIDER_SITE_OTHER): Payer: Medicare Other | Admitting: Neurology

## 2021-05-16 VITALS — BP 137/72 | HR 52 | Ht 70.0 in | Wt 182.8 lb

## 2021-05-16 DIAGNOSIS — G20 Parkinson's disease: Secondary | ICD-10-CM

## 2021-05-16 MED ORDER — ROPINIROLE HCL 3 MG PO TABS: 3.0000 mg | ORAL_TABLET | Freq: Three times a day (TID) | ORAL | 3 refills | Status: DC

## 2021-05-16 NOTE — Progress Notes (Signed)
Subjective:    Patient ID: Alexander Bowen is a 70 y.o. male.  HPI    Interim history:   Alexander Bowen is a 70 year old right-handed gentleman with an underlying medical history of SVT, PAF, on Eliquis, PVCs and PACs, hypertension, hyperlipidemia, recurrent kidney stones, and reflux disease, who presents for follow-up consultation of his parkinsonism, likely early right-sided Parkinson's disease.  The patient is accompanied by his wife, [name], again today. I last saw him on 11/15/20, at which time he was on ropinirole but had not noticed any telltale response.  He was on a low dose and I suggested he increase his medication to 1 mg strength and titrate up to 3 mg 3 times daily.  Today, 05/16/21 (all dictated new, as well as above notes, some dictation done in note pad or Word, outside of chart, may appear as copied):    He reports feeling fairly stable, he has noticed a modest response to the tremor with the ropinirole, is tolerating it at the current dose, 3 mg 3 times daily.  He has not had any falls, denies any side effects from the medication with the exception of some sleepiness reported about an hour or 2 after he takes it.  No drowsiness at the wheel.  His wife has monitored his driving and he is doing well.  He is active, goes to the gym about 5 times a week and works on an elliptical for about 30 minutes and also with weights for about 30 minutes at a time.  He tries to hydrate well with water.  Constipation is under good control with magnesium twice daily and he takes MiraLAX as needed, maybe once a month.  He sleeps fairly well currently he happens occasionally with dreams, fell out of bed some years ago, has had occasional yelling out in his sleep but not frequent per wife.  He currently takes his ropinirole 1 mg strength 3 pills 3 times daily.  8 AM, 3 PM and 10 PM daily.  Bedtime is generally between 11 and 11:30 PM and rise time between 6 and 6:30 AM.   The patient's allergies, current  medications, family history, past medical history, past social history, past surgical history and problem list were reviewed and updated as appropriate.    Previously (copied from previous notes for reference):    I first met him at the request of his primary care physician on 08/11/2020, at which time he reported a right hand tremor for the past year.  Examination was in keeping with mild right-sided parkinsonism.  I advised him to proceed with a DaTscan.  He had an interim DaTscan on 09/15/2020 and I reviewed the results: IMPRESSION: Asymmetric decreased activity of the LEFT putamen is suggestive of early Parkinsonian syndrome pathology.   Of note, DaTSCAN is not diagnostic of Parkinsonian syndromes, which remains a clinical diagnosis. DaTscan is an adjuvant test to aid in the clinical diagnosis of Parkinsonian syndromes.   We also called him with his test results.  He was agreeable to starting medication for his tremor and I suggested a trial of low-dose Requip with gradual titration.   08/11/20: (He) reports a right hand tremor for the past 1 year. I reviewed your office note from 05/30/2020.  His symptoms started approximately in October 2020.  At the time, he was going through different medication trials for his heart arrhythmias.  He tried flecainide, then propafenone, and mexiletine but eventually came off of all medications.  Since approximately mid May of  this year he has been off of mexiletine which was the last medication he was on.  He has been followed by cardiology on a regular basis.  His tremor started in the right hand and was initially intermittent but has progressed.  He initially thought that 1 of these medications was the culprit but even after stopping them he had an ongoing tremor and in fact it has progressed a little bit in that it is more frequent in occurrence.  It is often at rest.  It does not actually cause him to have any difficulty performing fine motor tasks.  He does not  have any significant tremor while feeding himself for using his dominant hand to shave.  He has not noticed any tremor on the left side or lower extremities.  He has not had any balance problems and no falls.  He does endorse shoulder discomfort for the past 3 years on the right side.  He does not have a family history for Parkinson's disease but his dad had a tremor, perhaps only on one side, the patient is not completely sure, dad lived to be 68.  Mom lived to be 69.  The patient has 2 brothers.  He lives with his wife, he is retired, worked in a [employer] operational officer, no kids.  He quit smoking in 1991 and quit drinking alcohol or caffeine when he was first noted to have A. fib in February 2021.  The tremor seems to be a little bit worse first thing in the morning and as the day progresses it seems to be better or less noticeable.  He had a home sleep test _0 years ago and at that time he did not have any obstructive sleep apnea.  His Past Medical History Is Significant For: Past Medical History:  Diagnosis Date   Atrial fibrillation ([place])    Atrial flutter ([place]) 09/27/2016   Overview:  Chads score 0, only an aspirin  Formatting of this note might be different from the original. Chads score 0, only an aspirin   Atypical chest pain 06/30/2018   Cholelithiasis 02/17/2019   Drug-induced erectile dysfunction 12/28/2016   Dyslipidemia 06/11/2017   Dyspnea on exertion 11/20/2019   Dysrhythmia 2001   palpitations -checked out by Cardiologist   GERD (gastroesophageal reflux disease)    High cholesterol    Hypertension    Major neurocognitive disorder due to Parkinson's disease, possible 08/24/2019   PAC (premature atrial contraction) 05/19/2020   PAF (paroxysmal atrial fibrillation) ([place]) 12/24/2019   Palpitations 09/27/2016   Parkinson disease ([place]) 09/19/2020   Patella fracture 08/05/2015   PONV (postoperative nausea and vomiting)    PVC (premature ventricular contraction) 11/20/2019   Supraventricular  tachycardia ([place]) 09/27/2016   Tinnitus 12/28/2019    His Past Surgical History Is Significant For: Past Surgical History:  Procedure Laterality Date   APPENDECTOMY     CHOLECYSTECTOMY     LITHOTRIPSY     x 2   ORIF PATELLA Left 08/05/2015   Procedure: OPEN REDUCTION INTERNAL (ORIF) LEFT  FIXATION PATELLA;  Surgeon: Susa Day, MD;  Location: [facility];  Service: Orthopedics;  Laterality: Left;   STONE EXTRACTION WITH BASKET     x2   TONSILLECTOMY      His Family History Is Significant For: Family History  Problem Relation Age of Onset   Heart disease Mother    Heart failure Mother    Diabetes Father    Prostate cancer Father    Dementia Father  His Social History Is Significant For: Social History   Socioeconomic History   Marital status: Married    Spouse name: Not on file   Number of children: Not on file   Years of education: Not on file   Highest education level: Not on file  Occupational History   Not on file  Tobacco Use   Smoking status: Former    Packs/day: 1.50    Years: 20.00    Pack years: 30.00    Types: Cigarettes    Quit date: 03/23/1989    Years since quitting: 32.1   Smokeless tobacco: Never  Vaping Use   Vaping Use: Never used  Substance and Sexual Activity   Alcohol use: Yes    Comment: occassionally   Drug use: No   Sexual activity: Not on file  Other Topics Concern   Not on file  Social History Narrative   Not on file   Social Determinants of Health   Financial Resource Strain: Not on file  Food Insecurity: Not on file  Transportation Needs: Not on file  Physical Activity: Not on file  Stress: Not on file  Social Connections: Not on file    His Allergies Are:  No Known Allergies:   His Current Medications Are:  Outpatient Encounter Medications as of 05/16/2021  Medication Sig   carvedilol (COREG) 3.125 MG tablet TAKE 1 TABLET(3.125 MG) BY MOUTH TWICE DAILY (Patient taking differently: Take 3.125 mg by mouth 2 (two) times  daily with a meal.)   diltiazem (CARDIZEM) 30 MG tablet Take 30 mg by mouth as needed (Afib sx's). Take if heart rate goes over 120   ELIQUIS 5 MG TABS tablet TAKE 1 TABLET(5 MG) BY MOUTH TWICE DAILY (Patient taking differently: Take 5 mg by mouth 2 (two) times daily.)   gabapentin (NEURONTIN) 300 MG capsule Take 1 capsule by mouth as needed (Neuropathy).   losartan (COZAAR) 50 MG tablet TAKE 1 TABLET(50 MG) BY MOUTH DAILY (Patient taking differently: Take 50 mg by mouth daily.)   magnesium gluconate (MAGONATE) 500 MG tablet Take 400 mg by mouth 2 (two) times daily.    omeprazole (PRILOSEC) 20 MG capsule Take 1 capsule by mouth as needed (Indigestion).   rOPINIRole (REQUIP) 1 MG tablet Take 2 pills 3 times daily. Follow written instructions provided separately (Patient taking differently: Take 3 mg by mouth 3 (three) times daily. Take 3 pills 3 times daily. Follow written instructions provided separately)   simvastatin (ZOCOR) 40 MG tablet Take 40 mg by mouth at bedtime.   No facility-administered encounter medications on file as of 05/16/2021.  :  Review of Systems:  Out of a complete 14 point review of systems, all are reviewed and negative with the exception of these symptoms as listed below:  Review of Systems  Neurological:        Rm  4, wife. Stable.  Requip taking 3 mg po TID.   Objective:  Neurological Exam  Physical Exam Physical Examination:   Vitals:   05/16/21 1034  BP: 137/72  Pulse: (!) 52    General Examination: The patient is a very pleasant 70 y.o. male in no acute distress. He appears well-developed and well-nourished and well groomed.   HEENT: Normocephalic, atraumatic, pupils are equal, round and reactive to light, tracking is well preserved, no nystagmus, corrective eyeglasses in place. He has hearing aids. Face is symmetric, no telltale facial masking noted, no significant hypophonia or voice tremor, no lip, neck or jaw tremor, slight nuchal  rigidity perhaps.  No  carotid bruits.  Airway examination shows mild airway crowding, tongue protrudes centrally and palate elevates symmetrically, hearing grossly intact.     Chest: Clear to auscultation without wheezing, rhonchi or crackles noted.   Heart: S1+S2+0, regular and normal without murmurs, rubs or gallops noted.   Abdomen: Soft, non-tender and non-distended.   Extremities: There is no pitting edema in the distal lower extremities bilaterally.   Skin: Warm and dry without trophic changes noted.   Musculoskeletal: exam reveals no obvious joint deformities, tenderness or joint swelling or erythema.   Neurologically: Mental status: The patient is awake, alert and oriented in all 4 spheres. His immediate and remote memory, attention, language skills and fund of knowledge are appropriate. There is no evidence of aphasia, agnosia, apraxia or anomia. Speech is clear with normal prosody and enunciation. Thought process is linear. Mood is normal and affect is normal. Cranial nerves II - XII are as described above under HEENT exam. In addition: shoulder shrug is normal with equal shoulder height noted. Motor exam: Normal bulk, strength. There is no drift, rebound.  He has a slight increase in tone in the right upper extremity.  He has a mild tremor at rest in the right upper extremity, no left upper extremity tremor, no lower extremity tremor.  Tremor is intermittent.  He has no significant postural or action tremor, no intention tremor. Mild impairment of fine motor skills noted in the right upper extremity, stable.   On 08/11/2020: On Archimedes spiral drawing he has no significant trembling with the right hand which is his dominant hand, he has some insecurity with the left hand, no significant trembling.  Handwriting is legible, not tremulous, not micrographic.   Cerebellar testing: No dysmetria or intention tremor. There is no truncal or gait ataxia. Sensory exam: intact to light touch in the upper and lower  extremities. Gait, station and balance: He stands without difficulty, he is able to stand narrow based.  His posture is slightly stooped for age.  He walks with good stride length and pace, decreased arm swing on the right.   Assessment and Plan:    In summary, HENDERSON FRAMPTON is a very pleasant 70 year old male with an underlying medical history of SVT, PAF, on Eliquis, PVCs and PACs, hypertension, hyperlipidemia, recurrent kidney stones, and reflux disease, who presents for follow-up consultation of his parkinsonism, with right-sided predominance noted, tremor started in the right hand in October 2020. His DaTscan from 09/15/20 showed asymmetric decreased activity of the LEFT putamen, suggestive of early Parkinsonian syndrome pathology. He started low-dose ropinirole with gradual titration in November 2021. He has tolerated the medication at 0.75 mg 3 times daily without obvious side effects but no significant or telltale results.  We increased this to 1 mg pills, and further titrated from 1 mg 3 times daily to up to 3 mg 3 times daily, which he is currently on. His exam is stable, tremor possibly slightly improved.  He has noticed modest improvement in his motor symptoms and is able to tolerate the medication without obvious side effects but has noted some sleepiness from it.  He denies any drowsiness while driving.  We will continue to monitor.  He is advised to continue with ropinirole 3 mg 3 times daily.  Since he is due for refill, I will change his prescription to the 3 mg strength take 1 pill 3 times daily.  He is advised to continue with his healthy lifestyle and physical activity  in particular.  He is commended for his positive outlook and healthy lifestyle and exercise regimen.  He has a history of dream enactment behavior which is currently not prominent and symptoms are not disturbing.  We will continue to monitor.  He is advised to follow-up routinely in this office in 6 months, sooner if needed.   I answered all their questions today and the patient and his wife are in agreement.  I spent 30 minutes in total face-to-face time and in reviewing records during pre-charting, more than 50% of which was spent in counseling and coordination of care, reviewing test results, reviewing medications and treatment regimen and/or in discussing or reviewing the diagnosis of PD, the prognosis and treatment options. Pertinent laboratory and imaging test results that were available during this visit with the patient were reviewed by me and considered in my medical decision making (see chart for details).

## 2021-05-16 NOTE — Patient Instructions (Signed)
It was nice to see you both again today.  You look well today.  I would like for you to continue with ropinirole, I have changed the prescription to 3 mg strength take 1 pill 3 times daily, you can maintain the current schedule.  You have done a great job with your physical activity and lifestyle.  Please keep up the good work!  Follow-up in 6 months routinely.

## 2021-05-22 ENCOUNTER — Ambulatory Visit: Payer: Medicare Other | Admitting: Neurology

## 2021-05-22 DIAGNOSIS — Z86008 Personal history of in-situ neoplasm of other site: Secondary | ICD-10-CM | POA: Diagnosis not present

## 2021-05-22 DIAGNOSIS — Z85828 Personal history of other malignant neoplasm of skin: Secondary | ICD-10-CM | POA: Diagnosis not present

## 2021-05-22 DIAGNOSIS — L821 Other seborrheic keratosis: Secondary | ICD-10-CM | POA: Diagnosis not present

## 2021-05-22 DIAGNOSIS — L570 Actinic keratosis: Secondary | ICD-10-CM | POA: Diagnosis not present

## 2021-05-22 DIAGNOSIS — L820 Inflamed seborrheic keratosis: Secondary | ICD-10-CM | POA: Diagnosis not present

## 2021-05-27 DIAGNOSIS — M545 Low back pain, unspecified: Secondary | ICD-10-CM | POA: Diagnosis not present

## 2021-05-27 DIAGNOSIS — M5459 Other low back pain: Secondary | ICD-10-CM | POA: Diagnosis not present

## 2021-06-02 DIAGNOSIS — M545 Low back pain, unspecified: Secondary | ICD-10-CM | POA: Diagnosis not present

## 2021-06-02 DIAGNOSIS — M5136 Other intervertebral disc degeneration, lumbar region: Secondary | ICD-10-CM | POA: Diagnosis not present

## 2021-06-05 ENCOUNTER — Telehealth: Payer: Self-pay

## 2021-06-05 NOTE — Telephone Encounter (Signed)
   [facility] Pre-operative Risk Assessment    Request for surgical clearance:  What type of surgery is being performed? Epidural injection   When is this surgery scheduled? TBD   What type of clearance is required (medical clearance vs. Pharmacy clearance to hold med vs. Both)? Pharmacy  Are there any medications that need to be held prior to surgery and how long?Eliquis, Holding length not specified   Practice name and name of physician performing surgery? [facility]  Dr. Susa Day   What is your office phone number: [phone] ext [phone]    7.   What is your office fax number: [phone]  8.   Anesthesia type (None, local, MAC, general) ? Not specified   Alexander Bowen 06/05/2021, 3:32 PM  _________________________________________________________________   (provider comments below)

## 2021-06-06 NOTE — Telephone Encounter (Signed)
Patient with diagnosis of afib on Eliquis for anticoagulation.    Procedure: epidural injection Date of procedure: TBD  CHA2DS2-VASc Score = 2  This indicates a 2.2% annual risk of stroke. The patient's score is based upon: CHF History: No HTN History: Yes Diabetes History: No Stroke History: No Vascular Disease History: No Age Score: 1 Gender Score: 0   CrCl 72m/min Platelet count 256K  Per office protocol, patient can hold Eliquis for 3 days prior to procedure.

## 2021-06-06 NOTE — Telephone Encounter (Signed)
   Primary Cardiologist: Jenne Campus, MD  Chart reviewed as part of pre-operative protocol coverage by clinical pharmacist.  [name] has received the following recommendations  Patient with diagnosis of afib on Eliquis for anticoagulation.     Procedure: epidural injection Date of procedure: TBD   CHA2DS2-VASc Score = 2  This indicates a 2.2% annual risk of stroke. The patient's score is based upon: CHF History: No HTN History: Yes Diabetes History: No Stroke History: No Vascular Disease History: No Age Score: 1 Gender Score: 0    CrCl 60m/min Platelet count 256K   Per office protocol, patient can hold Eliquis for 3 days prior to procedure.  I will route this recommendation to the requesting party via Epic fax function and remove from pre-op pool.  Please call with questions.  JJossie Ng Truong Delcastillo NP-C    06/06/2021, 11:29 AM [address] Office ([phone] ([phone]

## 2021-06-08 DIAGNOSIS — M5416 Radiculopathy, lumbar region: Secondary | ICD-10-CM

## 2021-06-08 HISTORY — DX: Radiculopathy, lumbar region: M54.16

## 2021-06-12 ENCOUNTER — Other Ambulatory Visit: Payer: Self-pay | Admitting: Cardiology

## 2021-06-12 DIAGNOSIS — I471 Supraventricular tachycardia: Secondary | ICD-10-CM | POA: Diagnosis not present

## 2021-06-12 DIAGNOSIS — I482 Chronic atrial fibrillation, unspecified: Secondary | ICD-10-CM | POA: Diagnosis not present

## 2021-06-12 DIAGNOSIS — E782 Mixed hyperlipidemia: Secondary | ICD-10-CM | POA: Diagnosis not present

## 2021-06-12 NOTE — Telephone Encounter (Signed)
Losartan 50 mg # 90 x 3 refills sent to  [facility], [address] AT [address]

## 2021-06-19 DIAGNOSIS — R059 Cough, unspecified: Secondary | ICD-10-CM | POA: Diagnosis not present

## 2021-06-19 DIAGNOSIS — R0981 Nasal congestion: Secondary | ICD-10-CM | POA: Diagnosis not present

## 2021-06-19 DIAGNOSIS — Z20822 Contact with and (suspected) exposure to covid-19: Secondary | ICD-10-CM | POA: Diagnosis not present

## 2021-06-19 DIAGNOSIS — J069 Acute upper respiratory infection, unspecified: Secondary | ICD-10-CM | POA: Diagnosis not present

## 2021-06-22 DIAGNOSIS — Z1331 Encounter for screening for depression: Secondary | ICD-10-CM | POA: Diagnosis not present

## 2021-06-22 DIAGNOSIS — Z139 Encounter for screening, unspecified: Secondary | ICD-10-CM | POA: Diagnosis not present

## 2021-06-22 DIAGNOSIS — J069 Acute upper respiratory infection, unspecified: Secondary | ICD-10-CM | POA: Diagnosis not present

## 2021-06-22 DIAGNOSIS — Z1152 Encounter for screening for COVID-19: Secondary | ICD-10-CM | POA: Diagnosis not present

## 2021-06-22 DIAGNOSIS — Z20822 Contact with and (suspected) exposure to covid-19: Secondary | ICD-10-CM | POA: Diagnosis not present

## 2021-06-29 ENCOUNTER — Telehealth: Payer: Self-pay | Admitting: Cardiology

## 2021-06-29 NOTE — Telephone Encounter (Signed)
Patient c/o Palpitations:  High priority if patient c/o lightheadedness, shortness of breath, or chest pain  How long have you had palpitations/irregular HR/ Afib? Are you having the symptoms now? 36 hours, yes  Are you currently experiencing lightheadedness, SOB or CP? Once in a while gets lightheaded, not lightheaded now  Do you have a history of afib (atrial fibrillation) or irregular heart rhythm? yes  Have you checked your BP or HR? (document readings if available): HR per his Jodelle Red mobile was 138  Are you experiencing any other symptoms? No    Patient states he has been in afib for 36 hours and his HR is 138. He states he has been getting lightheadedness once in a while.

## 2021-06-29 NOTE — Telephone Encounter (Signed)
Patient is scheduled to see Dr. Agustin Cree tomorrow at 12:40 pm. He verbalizes understanding and thanks me for the call back.

## 2021-06-29 NOTE — Telephone Encounter (Signed)
Spoke to the patient just now and he let me know that he has been in a-fib for what he believes to be the last 36 hours. He tells me that at times he feels lightheaded but this is normally when his heart rate is up in the 120's-130's. He states that right now he feels fine right now. He took his morning medications and his PRN Cardizem an hour ago. His blood pressure right now is 119/73 and heart rate is 82.   He would like recommendations from Dr. Agustin Cree as he has been having to take this Cardizem 30 mg PRN every 6 hours to keep his heart rate lowered and he tells me that even before the 6 hours is up it will start going back up in the 120's-130's.   Denies any other symptoms.

## 2021-06-29 NOTE — Telephone Encounter (Signed)
Spoke with the patient, he stated sx's still ongoing. I have schedule him tomorrow.

## 2021-06-30 ENCOUNTER — Ambulatory Visit (INDEPENDENT_AMBULATORY_CARE_PROVIDER_SITE_OTHER): Payer: Medicare Other | Admitting: Cardiology

## 2021-06-30 ENCOUNTER — Other Ambulatory Visit: Payer: Self-pay

## 2021-06-30 ENCOUNTER — Encounter: Payer: Self-pay | Admitting: Cardiology

## 2021-06-30 VITALS — BP 122/66 | HR 68 | Ht 70.0 in | Wt 184.2 lb

## 2021-06-30 DIAGNOSIS — R0600 Dyspnea, unspecified: Secondary | ICD-10-CM

## 2021-06-30 DIAGNOSIS — R0789 Other chest pain: Secondary | ICD-10-CM | POA: Diagnosis not present

## 2021-06-30 DIAGNOSIS — I493 Ventricular premature depolarization: Secondary | ICD-10-CM

## 2021-06-30 DIAGNOSIS — I480 Paroxysmal atrial fibrillation: Secondary | ICD-10-CM

## 2021-06-30 DIAGNOSIS — G20 Parkinson's disease: Secondary | ICD-10-CM | POA: Diagnosis not present

## 2021-06-30 DIAGNOSIS — R0609 Other forms of dyspnea: Secondary | ICD-10-CM

## 2021-06-30 NOTE — Progress Notes (Signed)
Cardiology Office Note:    Date:  06/30/2021   ID:  Alexander Bowen, DOB Jul 12, 1951, MRN [id number]  PCP:  Maryella Shivers, MD  Cardiologist:  Jenne Campus, MD    Referring MD: Maryella Shivers, MD   Chief Complaint  Patient presents with   Atrial Fibrillation    History of Present Illness:    Alexander Bowen is a 70 y.o. male with past medical history significant for PACs, PVC, paroxysmal atrial fibrillation, supraventricular tachycardia, dyslipidemia, Parkinson's.  He requested to be seen because he noted some palpitations.  He does have apple watch that allowed him to record his EKG it clearly show episode of atrial fibrillation.  He said it happened about once a month lasting for few hours longest episode 36 hours.  He does not have a chest pain tightness squeezing pressure burning chest is not dizzy when he got but balancing a lot.  Past Medical History:  Diagnosis Date   Atrial fibrillation [facility])    Atrial flutter ([place]) 09/27/2016   Overview:  Chads score 0, only an aspirin  Formatting of this note might be different from the original. Chads score 0, only an aspirin   Atypical chest pain 06/30/2018   Cholelithiasis 02/17/2019   Drug-induced erectile dysfunction 12/28/2016   Dyslipidemia 06/11/2017   Dyspnea on exertion 11/20/2019   Dysrhythmia 2001   palpitations -checked out by Cardiologist   GERD (gastroesophageal reflux disease)    High cholesterol    Hypertension    Major neurocognitive disorder due to Parkinson's disease, possible 08/24/2019   PAC (premature atrial contraction) 05/19/2020   PAF (paroxysmal atrial fibrillation) ([place]) 12/24/2019   Palpitations 09/27/2016   Parkinson disease ([place]) 09/19/2020   Patella fracture 08/05/2015   PONV (postoperative nausea and vomiting)    PVC (premature ventricular contraction) 11/20/2019   Supraventricular tachycardia ([place]) 09/27/2016   Tinnitus 12/28/2019    Past Surgical History:  Procedure Laterality Date    APPENDECTOMY     CHOLECYSTECTOMY     LITHOTRIPSY     x 2   ORIF PATELLA Left 08/05/2015   Procedure: OPEN REDUCTION INTERNAL (ORIF) LEFT  FIXATION PATELLA;  Surgeon: Susa Day, MD;  Location: [facility];  Service: Orthopedics;  Laterality: Left;   STONE EXTRACTION WITH BASKET     x2   TONSILLECTOMY      Current Medications: No outpatient medications have been marked as taking for the 06/30/21 encounter (Office Visit) with Park Liter, MD.     Allergies:   Patient has no known allergies.   Social History   Socioeconomic History   Marital status: Married    Spouse name: Not on file   Number of children: Not on file   Years of education: Not on file   Highest education level: Not on file  Occupational History   Not on file  Tobacco Use   Smoking status: Former    Packs/day: 1.50    Years: 20.00    Pack years: 30.00    Types: Cigarettes    Quit date: 03/23/1989    Years since quitting: 32.2   Smokeless tobacco: Never  Vaping Use   Vaping Use: Never used  Substance and Sexual Activity   Alcohol use: Yes    Comment: occassionally   Drug use: No   Sexual activity: Not on file  Other Topics Concern   Not on file  Social History Narrative   Not on file   Social Determinants of Health   Financial Resource Strain:  Not on file  Food Insecurity: Not on file  Transportation Needs: Not on file  Physical Activity: Not on file  Stress: Not on file  Social Connections: Not on file     Family History: The patient's family history includes Dementia in his father; Diabetes in his father; Heart disease in his mother; Heart failure in his mother; Prostate cancer in his father. ROS:   Please see the history of present illness.    All 14 point review of systems negative except as described per history of present illness  EKGs/Labs/Other Studies Reviewed:      Recent Labs: No results found for requested labs within last 8760 hours.  Recent Lipid Panel No results  found for: CHOL, TRIG, HDL, CHOLHDL, VLDL, LDLCALC, LDLDIRECT  Physical Exam:    VS:  BP 122/66 (BP Location: Left Arm, Patient Position: Sitting)   Pulse 68   Ht '5\' 10"'$  (1.778 m)   Wt 184 lb 3.2 oz (83.6 kg)   SpO2 98%   BMI 26.43 kg/m     Wt Readings from Last 3 Encounters:  06/30/21 184 lb 3.2 oz (83.6 kg)  05/16/21 182 lb 12.8 oz (82.9 kg)  03/15/21 180 lb (81.6 kg)     GEN:  Well nourished, well developed in no acute distress HEENT: Normal NECK: No JVD; No carotid bruits LYMPHATICS: No lymphadenopathy CARDIAC: RRR, no murmurs, no rubs, no gallops RESPIRATORY:  Clear to auscultation without rales, wheezing or rhonchi  ABDOMEN: Soft, non-tender, non-distended MUSCULOSKELETAL:  No edema; No deformity  SKIN: Warm and dry LOWER EXTREMITIES: no swelling NEUROLOGIC:  Alert and oriented x 3 PSYCHIATRIC:  Normal affect   ASSESSMENT:    1. PAF (paroxysmal atrial fibrillation) ([place])   2. PVC (premature ventricular contraction)   3. Parkinson disease ([place])   4. Atypical chest pain   5. Dyspnea on exertion    PLAN:    In order of problems listed above:  Paroxysmal atrial fibrillation became uncontrolled.  We did talk in length about what to do with the situation previously we tried flecainide however that flecainide was used to suppress his PVCs which was an effective I think there will be well to suppress his atrial fibrillation however before I consider this medication I need to track his left ventricle ejection fraction will which we will do by doing echocardiogram as well as rule out ischemia and will do Lexiscan to prove that. PVCs seems to be controlled now Parkinson's followed by neurology stable Atypical chest pain denies having any   Medication Adjustments/Labs and Tests Ordered: Current medicines are reviewed at length with the patient today.  Concerns regarding medicines are outlined above.  No orders of the defined types were placed in this  encounter.  Medication changes: No orders of the defined types were placed in this encounter.   Signed, Park Liter, MD, [facility] 06/30/2021 1:34 PM    [place]

## 2021-06-30 NOTE — Addendum Note (Signed)
Addended by: Resa Miner I on: 06/30/2021 01:42 PM   Modules accepted: Orders

## 2021-06-30 NOTE — Patient Instructions (Signed)
Medication Instructions:  Your physician recommends that you continue on your current medications as directed. Please refer to the Current Medication list given to you today.  *If you need a refill on your cardiac medications before your next appointment, please call your pharmacy*   Lab Work: None If you have labs (blood work) drawn today and your tests are completely normal, you will receive your results only by: [place] (if you have MyChart) OR A paper copy in the mail If you have any lab test that is abnormal or we need to change your treatment, we will call you to review the results.   Testing/Procedures: Your physician has requested that you have an echocardiogram. Echocardiography is a painless test that uses sound waves to create images of your heart. It provides your doctor with information about the size and shape of your heart and how well your heart's chambers and valves are working. This procedure takes approximately one hour. There are no restrictions for this procedure.    [facility] [address] [place], [place] [address] Phone:  [phone]    Please arrive 15 minutes prior to your appointment time for registration and insurance purposes.  The test will take approximately 3 to 4 hours to complete; you may bring reading material.  If someone comes with you to your appointment, they will need to remain in the main lobby due to limited space in the testing area. **If you are pregnant or breastfeeding, please notify the nuclear lab prior to your appointment**  How to prepare for your Myocardial Perfusion Test: Do not eat or drink 3 hours prior to your test, except you may have water. Do not consume products containing caffeine (regular or decaffeinated) 12 hours prior to your test. (ex: coffee, chocolate, sodas, tea). Do bring a list of your current medications with you.  If not listed below, you may take your medications as  normal. Do wear comfortable clothes (no dresses or overalls) and walking shoes, tennis shoes preferred (No heels or open toe shoes are allowed). Do NOT wear cologne, perfume, aftershave, or lotions (deodorant is allowed). If these instructions are not followed, your test will have to be rescheduled.  Please report to [address] for your test.  If you have questions or concerns about your appointment, you can call the [facility] [place] [facility] at [phone].  If you cannot keep your appointment, please provide 24 hours notification to the Nuclear Lab, to avoid a possible $50 charge to your account.    Follow-Up: At [facility], you and your health needs are our priority.  As part of our continuing mission to provide you with exceptional heart care, we have created designated Provider Care Teams.  These Care Teams include your primary Cardiologist (physician) and Advanced Practice Providers (APPs -  Physician Assistants and Nurse Practitioners) who all work together to provide you with the care you need, when you need it.  We recommend signing up for the patient portal called "MyChart".  Sign up information is provided on this After Visit Summary.  MyChart is used to connect with patients for Virtual Visits (Telemedicine).  Patients are able to view lab/test results, encounter notes, upcoming appointments, etc.  Non-urgent messages can be sent to your provider as well.   To learn more about what you can do with MyChart, go to [email or web address].    Your next appointment:   3 month(s)  The format for your next appointment:  In Person  Provider:   Jenne Campus, MD   Other Instructions

## 2021-07-11 ENCOUNTER — Telehealth (HOSPITAL_COMMUNITY): Payer: Self-pay | Admitting: *Deleted

## 2021-07-11 NOTE — Telephone Encounter (Signed)
Patient given detailed instructions per Myocardial Perfusion Study Information Sheet for the test on 07/18/21 at 8:00. Patient notified to arrive 15 minutes early and that it is imperative to arrive on time for appointment to keep from having the test rescheduled.  If you need to cancel or reschedule your appointment, please call the office within 24 hours of your appointment. . Patient verbalized understanding.Alexander Bowen

## 2021-07-13 DIAGNOSIS — I471 Supraventricular tachycardia: Secondary | ICD-10-CM | POA: Diagnosis not present

## 2021-07-13 DIAGNOSIS — E782 Mixed hyperlipidemia: Secondary | ICD-10-CM | POA: Diagnosis not present

## 2021-07-13 DIAGNOSIS — I482 Chronic atrial fibrillation, unspecified: Secondary | ICD-10-CM | POA: Diagnosis not present

## 2021-07-13 DIAGNOSIS — M5416 Radiculopathy, lumbar region: Secondary | ICD-10-CM | POA: Diagnosis not present

## 2021-07-18 ENCOUNTER — Ambulatory Visit (INDEPENDENT_AMBULATORY_CARE_PROVIDER_SITE_OTHER): Payer: Medicare Other

## 2021-07-18 ENCOUNTER — Other Ambulatory Visit: Payer: Self-pay

## 2021-07-18 DIAGNOSIS — R0789 Other chest pain: Secondary | ICD-10-CM

## 2021-07-18 DIAGNOSIS — I493 Ventricular premature depolarization: Secondary | ICD-10-CM | POA: Diagnosis not present

## 2021-07-18 DIAGNOSIS — I480 Paroxysmal atrial fibrillation: Secondary | ICD-10-CM | POA: Diagnosis not present

## 2021-07-18 LAB — ECHOCARDIOGRAM COMPLETE
Area-P 1/2: 3.17 cm2
Height: 70 in
S' Lateral: 3.1 cm
Weight: 2944 oz

## 2021-07-18 LAB — MYOCARDIAL PERFUSION IMAGING
LV dias vol: 90 mL (ref 62–150)
LV sys vol: 34 mL
Nuc Stress EF: 62 %
Peak HR: 66 {beats}/min
Rest HR: 50 {beats}/min
Rest Nuclear Isotope Dose: 10.1 mCi
SDS: 5
SRS: 3
SSS: 8
Stress Nuclear Isotope Dose: 29.8 mCi
TID: 1.02

## 2021-07-18 MED ORDER — REGADENOSON 0.4 MG/5ML IV SOLN
0.4000 mg | Freq: Once | INTRAVENOUS | Status: AC
  Administered 2021-07-18: 0.4 mg via INTRAVENOUS

## 2021-07-18 MED ORDER — TECHNETIUM TC 99M TETROFOSMIN IV KIT
29.8000 | PACK | Freq: Once | INTRAVENOUS | Status: AC | PRN
  Administered 2021-07-18: 29.8 via INTRAVENOUS

## 2021-07-18 MED ORDER — PERFLUTREN LIPID MICROSPHERE
1.0000 mL | INTRAVENOUS | Status: AC | PRN
  Administered 2021-07-18: 4 mL via INTRAVENOUS

## 2021-07-18 MED ORDER — TECHNETIUM TC 99M TETROFOSMIN IV KIT
10.1000 | PACK | Freq: Once | INTRAVENOUS | Status: AC | PRN
  Administered 2021-07-18: 10.1 via INTRAVENOUS

## 2021-07-18 NOTE — Addendum Note (Signed)
Addended by: Jenne Campus on: 07/18/2021 12:58 PM   Modules accepted: Orders

## 2021-07-18 NOTE — Addendum Note (Signed)
Addended by: Truddie Hidden on: 07/18/2021 12:17 PM   Modules accepted: Orders

## 2021-07-19 ENCOUNTER — Telehealth: Payer: Self-pay

## 2021-07-19 NOTE — Telephone Encounter (Signed)
Left message on patients voicemail to please return our call.   

## 2021-07-19 NOTE — Telephone Encounter (Signed)
-----   Message from Park Liter, MD sent at 07/19/2021 12:29 PM EDT ----- Stress test showing no evidence of ischemia

## 2021-07-20 ENCOUNTER — Telehealth: Payer: Self-pay

## 2021-07-20 NOTE — Telephone Encounter (Signed)
Spoke with patient regarding results and recommendation.  Patient verbalizes understanding and is agreeable to plan of care. Advised patient to call back with any issues or concerns.  

## 2021-07-20 NOTE — Telephone Encounter (Signed)
-----   Message from Park Liter, MD sent at 07/19/2021 12:29 PM EDT ----- Stress test showing no evidence of ischemia

## 2021-08-12 DIAGNOSIS — E782 Mixed hyperlipidemia: Secondary | ICD-10-CM | POA: Diagnosis not present

## 2021-08-12 DIAGNOSIS — I471 Supraventricular tachycardia: Secondary | ICD-10-CM | POA: Diagnosis not present

## 2021-08-12 DIAGNOSIS — I482 Chronic atrial fibrillation, unspecified: Secondary | ICD-10-CM | POA: Diagnosis not present

## 2021-08-25 DIAGNOSIS — M545 Low back pain, unspecified: Secondary | ICD-10-CM | POA: Diagnosis not present

## 2021-08-28 DIAGNOSIS — J019 Acute sinusitis, unspecified: Secondary | ICD-10-CM | POA: Diagnosis not present

## 2021-08-28 DIAGNOSIS — Z20822 Contact with and (suspected) exposure to covid-19: Secondary | ICD-10-CM | POA: Diagnosis not present

## 2021-08-28 DIAGNOSIS — J029 Acute pharyngitis, unspecified: Secondary | ICD-10-CM | POA: Diagnosis not present

## 2021-08-30 DIAGNOSIS — R040 Epistaxis: Secondary | ICD-10-CM | POA: Diagnosis not present

## 2021-09-06 DIAGNOSIS — E782 Mixed hyperlipidemia: Secondary | ICD-10-CM | POA: Diagnosis not present

## 2021-09-06 DIAGNOSIS — Z125 Encounter for screening for malignant neoplasm of prostate: Secondary | ICD-10-CM | POA: Diagnosis not present

## 2021-09-11 DIAGNOSIS — Z23 Encounter for immunization: Secondary | ICD-10-CM | POA: Diagnosis not present

## 2021-09-11 DIAGNOSIS — R972 Elevated prostate specific antigen [PSA]: Secondary | ICD-10-CM | POA: Diagnosis not present

## 2021-09-11 DIAGNOSIS — I482 Chronic atrial fibrillation, unspecified: Secondary | ICD-10-CM | POA: Diagnosis not present

## 2021-09-11 DIAGNOSIS — E782 Mixed hyperlipidemia: Secondary | ICD-10-CM | POA: Diagnosis not present

## 2021-09-12 DIAGNOSIS — E782 Mixed hyperlipidemia: Secondary | ICD-10-CM | POA: Diagnosis not present

## 2021-09-12 DIAGNOSIS — I471 Supraventricular tachycardia: Secondary | ICD-10-CM | POA: Diagnosis not present

## 2021-09-12 DIAGNOSIS — I482 Chronic atrial fibrillation, unspecified: Secondary | ICD-10-CM | POA: Diagnosis not present

## 2021-09-22 DIAGNOSIS — J310 Chronic rhinitis: Secondary | ICD-10-CM | POA: Diagnosis not present

## 2021-09-22 DIAGNOSIS — J342 Deviated nasal septum: Secondary | ICD-10-CM | POA: Diagnosis not present

## 2021-09-22 DIAGNOSIS — R0981 Nasal congestion: Secondary | ICD-10-CM | POA: Diagnosis not present

## 2021-09-22 DIAGNOSIS — J3489 Other specified disorders of nose and nasal sinuses: Secondary | ICD-10-CM | POA: Diagnosis not present

## 2021-09-22 DIAGNOSIS — J343 Hypertrophy of nasal turbinates: Secondary | ICD-10-CM | POA: Diagnosis not present

## 2021-09-22 DIAGNOSIS — Z87898 Personal history of other specified conditions: Secondary | ICD-10-CM | POA: Diagnosis not present

## 2021-09-22 DIAGNOSIS — J329 Chronic sinusitis, unspecified: Secondary | ICD-10-CM | POA: Diagnosis not present

## 2021-10-03 ENCOUNTER — Ambulatory Visit: Payer: Medicare Other | Admitting: Cardiology

## 2021-10-09 DIAGNOSIS — J329 Chronic sinusitis, unspecified: Secondary | ICD-10-CM | POA: Diagnosis not present

## 2021-10-09 DIAGNOSIS — J342 Deviated nasal septum: Secondary | ICD-10-CM | POA: Diagnosis not present

## 2021-10-09 DIAGNOSIS — J320 Chronic maxillary sinusitis: Secondary | ICD-10-CM | POA: Diagnosis not present

## 2021-10-09 DIAGNOSIS — J310 Chronic rhinitis: Secondary | ICD-10-CM | POA: Diagnosis not present

## 2021-10-09 DIAGNOSIS — J323 Chronic sphenoidal sinusitis: Secondary | ICD-10-CM | POA: Diagnosis not present

## 2021-10-12 DIAGNOSIS — I471 Supraventricular tachycardia: Secondary | ICD-10-CM | POA: Diagnosis not present

## 2021-10-12 DIAGNOSIS — I482 Chronic atrial fibrillation, unspecified: Secondary | ICD-10-CM | POA: Diagnosis not present

## 2021-10-12 DIAGNOSIS — E782 Mixed hyperlipidemia: Secondary | ICD-10-CM | POA: Diagnosis not present

## 2021-10-16 ENCOUNTER — Ambulatory Visit (INDEPENDENT_AMBULATORY_CARE_PROVIDER_SITE_OTHER): Payer: Medicare Other | Admitting: Cardiology

## 2021-10-16 ENCOUNTER — Other Ambulatory Visit: Payer: Self-pay

## 2021-10-16 ENCOUNTER — Encounter: Payer: Self-pay | Admitting: Cardiology

## 2021-10-16 VITALS — BP 126/68 | HR 67 | Ht 70.0 in | Wt 187.0 lb

## 2021-10-16 DIAGNOSIS — Z01810 Encounter for preprocedural cardiovascular examination: Secondary | ICD-10-CM

## 2021-10-16 DIAGNOSIS — J343 Hypertrophy of nasal turbinates: Secondary | ICD-10-CM | POA: Diagnosis not present

## 2021-10-16 DIAGNOSIS — I471 Supraventricular tachycardia: Secondary | ICD-10-CM | POA: Diagnosis not present

## 2021-10-16 DIAGNOSIS — J328 Other chronic sinusitis: Secondary | ICD-10-CM | POA: Diagnosis not present

## 2021-10-16 DIAGNOSIS — J3489 Other specified disorders of nose and nasal sinuses: Secondary | ICD-10-CM | POA: Diagnosis not present

## 2021-10-16 DIAGNOSIS — G20 Parkinson's disease: Secondary | ICD-10-CM

## 2021-10-16 DIAGNOSIS — I493 Ventricular premature depolarization: Secondary | ICD-10-CM | POA: Diagnosis not present

## 2021-10-16 DIAGNOSIS — R0981 Nasal congestion: Secondary | ICD-10-CM | POA: Diagnosis not present

## 2021-10-16 DIAGNOSIS — J320 Chronic maxillary sinusitis: Secondary | ICD-10-CM | POA: Diagnosis not present

## 2021-10-16 DIAGNOSIS — I482 Chronic atrial fibrillation, unspecified: Secondary | ICD-10-CM | POA: Diagnosis not present

## 2021-10-16 DIAGNOSIS — E782 Mixed hyperlipidemia: Secondary | ICD-10-CM | POA: Diagnosis not present

## 2021-10-16 DIAGNOSIS — Z01818 Encounter for other preprocedural examination: Secondary | ICD-10-CM | POA: Diagnosis not present

## 2021-10-16 DIAGNOSIS — J323 Chronic sphenoidal sinusitis: Secondary | ICD-10-CM | POA: Diagnosis not present

## 2021-10-16 DIAGNOSIS — R0789 Other chest pain: Secondary | ICD-10-CM

## 2021-10-16 DIAGNOSIS — J342 Deviated nasal septum: Secondary | ICD-10-CM | POA: Diagnosis not present

## 2021-10-16 DIAGNOSIS — J321 Chronic frontal sinusitis: Secondary | ICD-10-CM | POA: Diagnosis not present

## 2021-10-16 DIAGNOSIS — M4807 Spinal stenosis, lumbosacral region: Secondary | ICD-10-CM | POA: Diagnosis not present

## 2021-10-16 DIAGNOSIS — M5416 Radiculopathy, lumbar region: Secondary | ICD-10-CM

## 2021-10-16 DIAGNOSIS — I480 Paroxysmal atrial fibrillation: Secondary | ICD-10-CM | POA: Diagnosis not present

## 2021-10-16 HISTORY — DX: Encounter for preprocedural cardiovascular examination: Z01.810

## 2021-10-16 NOTE — Patient Instructions (Signed)

## 2021-10-16 NOTE — Progress Notes (Signed)
Cardiology Office Note:    Date:  10/16/2021   ID:  Alexander Bowen, DOB October 26, 1951, MRN [id number]  PCP:  Maryella Shivers, MD  Cardiologist:  Jenne Campus, MD    Referring MD: Maryella Shivers, MD   Chief Complaint  Patient presents with   Follow-up  Doing better in terms of mild rhythm problems  History of Present Illness:    Alexander Bowen is a 70 y.o. male with past medical history significant for PVCs, PACs, proximal mitral fibrillation, supraventricular tachycardia, dyslipidemia, Parkinson's. Is coming today to my office to talk about his issues last time I seen him he reported increased frequency of palpitations and atrial fibrillation that he recorded on his recording device.  We did stress test echocardiogram with anticipation of starting him on antiarrhythmic therapy however he said he feels much better episode of palpitation diminished significantly he does not have any more atrial fibrillation.  However, he describes episodes when his heart speeds up he take a deep breath hold it and that sensation goes away that happen maybe once or twice a week.  He denies have any chest pain tightness squeezing pressure burning chest.  He does have back problem he anticipates surgery and he would like to make sure that this is safe from cardiac point of view to proceed with surgical intervention for his back.  He also is gone as he ENT doctor with some chronic sinuses problem and again the same story he anticipate need to have surgery for it.  Past Medical History:  Diagnosis Date   Atrial fibrillation [facility])    Atrial flutter ([place]) 09/27/2016   Overview:  Chads score 0, only an aspirin  Formatting of this note might be different from the original. Chads score 0, only an aspirin   Atypical chest pain 06/30/2018   Cholelithiasis 02/17/2019   Drug-induced erectile dysfunction 12/28/2016   Dyslipidemia 06/11/2017   Dyspnea on exertion 11/20/2019   Dysrhythmia 2001   palpitations -checked  out by Cardiologist   GERD (gastroesophageal reflux disease)    High cholesterol    Hypertension    Lumbar radiculopathy 06/08/2021   Major neurocognitive disorder due to Parkinson's disease, possible 08/24/2019   PAC (premature atrial contraction) 05/19/2020   PAF (paroxysmal atrial fibrillation) ([place]) 12/24/2019   Palpitations 09/27/2016   Parkinson disease ([place]) 09/19/2020   Patella fracture 08/05/2015   PONV (postoperative nausea and vomiting)    PVC (premature ventricular contraction) 11/20/2019   Supraventricular tachycardia ([place]) 09/27/2016   Tinnitus 12/28/2019    Past Surgical History:  Procedure Laterality Date   APPENDECTOMY     CHOLECYSTECTOMY     LITHOTRIPSY     x 2   ORIF PATELLA Left 08/05/2015   Procedure: OPEN REDUCTION INTERNAL (ORIF) LEFT  FIXATION PATELLA;  Surgeon: Susa Day, MD;  Location: [facility];  Service: Orthopedics;  Laterality: Left;   STONE EXTRACTION WITH BASKET     x2   TONSILLECTOMY      Current Medications: Current Meds  Medication Sig   carvedilol (COREG) 3.125 MG tablet TAKE 1 TABLET(3.125 MG) BY MOUTH TWICE DAILY (Patient taking differently: Take 3.125 mg by mouth 2 (two) times daily with a meal.)   diltiazem (CARDIZEM) 30 MG tablet Take 30 mg by mouth as needed (Afib sx's). Take if heart rate goes over 120   ELIQUIS 5 MG TABS tablet TAKE 1 TABLET(5 MG) BY MOUTH TWICE DAILY (Patient taking differently: Take 5 mg by mouth 2 (two) times daily.)   gabapentin (  NEURONTIN) 300 MG capsule Take 1 capsule by mouth as needed (Neuropathy).   losartan (COZAAR) 50 MG tablet TAKE 1 TABLET(50 MG) BY MOUTH DAILY (Patient taking differently: Take 50 mg by mouth daily.)   magnesium gluconate (MAGONATE) 500 MG tablet Take 400 mg by mouth 2 (two) times daily.    omeprazole (PRILOSEC) 20 MG capsule Take 1 capsule by mouth as needed (Indigestion).   rOPINIRole (REQUIP) 3 MG tablet Take 1 tablet (3 mg total) by mouth 3 (three) times daily.   simvastatin (ZOCOR) 40 MG  tablet Take 40 mg by mouth at bedtime.     Allergies:   Patient has no known allergies.   Social History   Socioeconomic History   Marital status: Married    Spouse name: Not on file   Number of children: Not on file   Years of education: Not on file   Highest education level: Not on file  Occupational History   Not on file  Tobacco Use   Smoking status: Former    Packs/day: 1.50    Years: 20.00    Pack years: 30.00    Types: Cigarettes    Quit date: 03/23/1989    Years since quitting: 32.5   Smokeless tobacco: Never  Vaping Use   Vaping Use: Never used  Substance and Sexual Activity   Alcohol use: Yes    Comment: occassionally   Drug use: No   Sexual activity: Not on file  Other Topics Concern   Not on file  Social History Narrative   Not on file   Social Determinants of Health   Financial Resource Strain: Not on file  Food Insecurity: Not on file  Transportation Needs: Not on file  Physical Activity: Not on file  Stress: Not on file  Social Connections: Not on file     Family History: The patient's family history includes Dementia in his father; Diabetes in his father; Heart disease in his mother; Heart failure in his mother; Prostate cancer in his father. ROS:   Please see the history of present illness.    All 14 point review of systems negative except as described per history of present illness  EKGs/Labs/Other Studies Reviewed:      Recent Labs: No results found for requested labs within last 8760 hours.  Recent Lipid Panel No results found for: CHOL, TRIG, HDL, CHOLHDL, VLDL, LDLCALC, LDLDIRECT  Physical Exam:    VS:  BP 126/68 (BP Location: Right Arm, Patient Position: Sitting)   Pulse 67   Ht 5\' 10"  (1.778 m)   Wt 187 lb (84.8 kg)   SpO2 95%   BMI 26.83 kg/m     Wt Readings from Last 3 Encounters:  10/16/21 187 lb (84.8 kg)  07/18/21 184 lb (83.5 kg)  06/30/21 184 lb 3.2 oz (83.6 kg)     GEN:  Well nourished, well developed in no  acute distress HEENT: Normal NECK: No JVD; No carotid bruits LYMPHATICS: No lymphadenopathy CARDIAC: RRR, no murmurs, no rubs, no gallops RESPIRATORY:  Clear to auscultation without rales, wheezing or rhonchi  ABDOMEN: Soft, non-tender, non-distended MUSCULOSKELETAL:  No edema; No deformity  SKIN: Warm and dry LOWER EXTREMITIES: no swelling NEUROLOGIC:  Alert and oriented x 3 PSYCHIATRIC:  Normal affect   ASSESSMENT:    1. Paroxysmal atrial fibrillation (H[facility])   2. PVC (premature ventricular contraction)   3. Supraventricular tachycardia ([facility])   4. Parkinson disease ([place])   5. Lumbar radiculopathy   6. Atypical chest pain  7. Preop cardiovascular exam    PLAN:    In order of problems listed above:  Paroxysmal atrial fibrillation.  He is anticoagulant which I will continue.  We were talking about potentially starting antiarrhythmic therapy but overall he said he is not bothered much by episode of atrial fibrillation.  He does have palpitation and I offered him monitoring however he does not want to wear it he said it does not bother him much he take a deep breath called and the sensation goes away.  I told him if this sensation became more frequent or if he decides to have some test on him for it he needs to let me know and then I will schedule him to have Zio patch for 2 weeks. Previously in supraventricular tachycardia plan as described above in the meantime we will continue present management Parkinson disease.  Noted, some shakes in the right hand noted like always continue monitoring Lumbar radiculopathy, surgery is contemplated if surgery is required he is anticoagulation need to be withdrawn for at least 48 hours and seems to be intervening on neurosystem up to 5 days. Atypical chest pain denies having any, echocardiogram reviewed with stress test showing no evidence of ischemia Cardiovascular preop evaluation stable from cardiac standpoint reviewed to pursue surgical  intervention if needed.  If we talking about intervening on his spine his anticoagulation will have to be held for at least 5 days.   Medication Adjustments/Labs and Tests Ordered: Current medicines are reviewed at length with the patient today.  Concerns regarding medicines are outlined above.  No orders of the defined types were placed in this encounter.  Medication changes: No orders of the defined types were placed in this encounter.   Signed, Park Liter, MD, [facility] 10/16/2021 9:36 AM    [place]

## 2021-10-18 ENCOUNTER — Telehealth: Payer: Self-pay

## 2021-10-18 NOTE — Telephone Encounter (Signed)
   [facility] Pre-operative Risk Assessment    Request for surgical clearance:  What type of surgery is being performed? Lumbar Decompression    When is this surgery scheduled? TBD   What type of clearance is required (medical clearance vs. Pharmacy clearance to hold med vs. Both)? Both  Are there any medications that need to be held prior to surgery and how long?Eliquis, holding length not specified   Practice name and name of physician performing surgery? Dr Susa Day a [facility] What is your office phone number: [phone]    7.   What is your office fax number: [phone]  8.   Anesthesia type (None, local, MAC, general) ? General Anesthesia   Alexander Bowen Alexander Bowen 10/18/2021, 4:08 PM  _________________________________________________________________   (provider comments below)

## 2021-10-19 NOTE — Telephone Encounter (Signed)
Covering preop today. Will route to pharm team for input on anticoagulation. Dr. Agustin Cree did address pre-op clearance in note 12/5 but does state "If we talking about intervening on his spine his anticoagulation will have to be held for at least 5 days." I will route to pharm and they may need to loop in Dr. Agustin Cree to clarify. Patient is on Eliquis.

## 2021-10-19 NOTE — Telephone Encounter (Signed)
Patient with diagnosis of afib on Eliquis for anticoagulation.    Procedure: lumbar decompression Date of procedure: TBD  CHA2DS2-VASc Score = 2   This indicates a 2.2% annual risk of stroke. The patient's score is based upon: CHF History: 0 HTN History: 1 Diabetes History: 0 Stroke History: 0 Vascular Disease History: 0 Age Score: 1 Gender Score: 0  CrCl 63mL/min Platelet count 256K  Per office protocol, patient can hold Eliquis for 3 days prior to spinal procedure. 5 day hold typically reserved for periprocedural interruption of warfarin.

## 2021-10-20 ENCOUNTER — Other Ambulatory Visit: Payer: Self-pay | Admitting: Cardiology

## 2021-10-20 NOTE — Telephone Encounter (Signed)
I will route this recommendation to Dr. Agustin Cree to ensure he agrees with plan. His recent note states "Cardiovascular preop evaluation stable from cardiac standpoint reviewed to pursue surgical intervention if needed.  If we talking about intervening on his spine his anticoagulation will have to be held for at least 5 days." Pharmacy team is recommending to hold Eliquis for 3 days. Dr. Agustin Cree - Please route response to P CV DIV PREOP (the pre-op pool). Thank you.

## 2021-10-23 NOTE — Telephone Encounter (Signed)
    Patient Name: Alexander Bowen  DOB: 1951-09-12 MRN: [id number]  Primary Cardiologist: Jenne Campus, MD  Chart reviewed as part of pre-operative protocol coverage. Given past medical history and time since last visit, based on ACC/AHA guidelines, LEONDRO CORYELL would be at acceptable risk for the planned procedure without further cardiovascular testing.  Patient was recently cleared by Dr. Agustin Cree during office visit on 10/16/2021.  Although there was a question of how long to hold Eliquis prior to the procedure, Dr. Agustin Cree responded this morning to hold Eliquis for 5 days. ("Hold it for 5 days please")  I will route this recommendation to the requesting party via Epic fax function and remove from pre-op pool.  Please call with questions.  [place], [place] 10/23/2021, 6:21 PM

## 2021-10-31 ENCOUNTER — Other Ambulatory Visit: Payer: Self-pay | Admitting: Cardiology

## 2021-10-31 NOTE — Telephone Encounter (Signed)
Prescription refill request for Eliquis received.  Indication: aflutter Last office visit: Agustin Cree, 10/16/2021 Scr: 1.0, 09/06/2021 Age: 70 Weight: 84.8 kg   Refill sent.

## 2021-11-12 DIAGNOSIS — I471 Supraventricular tachycardia: Secondary | ICD-10-CM | POA: Diagnosis not present

## 2021-11-12 DIAGNOSIS — I482 Chronic atrial fibrillation, unspecified: Secondary | ICD-10-CM | POA: Diagnosis not present

## 2021-11-12 DIAGNOSIS — E782 Mixed hyperlipidemia: Secondary | ICD-10-CM | POA: Diagnosis not present

## 2021-11-20 ENCOUNTER — Ambulatory Visit (INDEPENDENT_AMBULATORY_CARE_PROVIDER_SITE_OTHER): Payer: Medicare Other | Admitting: Neurology

## 2021-11-20 ENCOUNTER — Encounter: Payer: Self-pay | Admitting: Neurology

## 2021-11-20 VITALS — BP 142/78 | HR 54 | Ht 70.0 in | Wt 189.0 lb

## 2021-11-20 DIAGNOSIS — G20 Parkinson's disease: Secondary | ICD-10-CM | POA: Diagnosis not present

## 2021-11-20 DIAGNOSIS — D485 Neoplasm of uncertain behavior of skin: Secondary | ICD-10-CM | POA: Diagnosis not present

## 2021-11-20 DIAGNOSIS — L570 Actinic keratosis: Secondary | ICD-10-CM | POA: Diagnosis not present

## 2021-11-20 DIAGNOSIS — L820 Inflamed seborrheic keratosis: Secondary | ICD-10-CM | POA: Diagnosis not present

## 2021-11-20 NOTE — Progress Notes (Signed)
Subjective:    Patient ID: Alexander Bowen is a 71 y.o. male.  HPI    Interim history:   Alexander Bowen is a 71 year old right-handed gentleman with an underlying medical history of SVT, PAF, on Eliquis, PVCs and PACs, hypertension, hyperlipidemia, recurrent kidney stones, and reflux disease, who presents for follow-up consultation of his right-sided Parkinson's disease.  The patient is accompanied by his wife, [name], again today. I last saw him on 05/16/2021, at which time he was fairly stable, he was tolerating the ropinirole, I suggested we change the prescription to 3 mg strength 1 pill 3 times daily.  He had intermittent issues with constipation which were generally under control with magnesium twice daily and taking MiraLAX as needed infrequently.  He was advised to continue with the same dose, continue with his exercise regimen and healthy lifestyle and follow-up routinely in 6 months.   Today, 11/20/2021: He reports doing fairly well, overall stable, no recent issues with constipation or cognitive complaints or mood related issues or falls thankfully.  He is quite active, exercises on a regular basis and goes to the gym several times a week.  He is followed regularly by cardiology and his PCP, has an appointment with his PCP next month.  When he occasionally has a delay in the ropinirole dose, he can see a flareup in his tremor, overall tolerates the medication at 3 mg 3 times daily at this time.  He has had no seizures in the foot, has a history of low back pain and sciatica on the left, had an epidural injection with Dr. Nelva Bush recently in October 2022 but it did not help very much.  He is planning surgery with Dr. Tonita Cong. He has seen an article recently about hemp helping with tremors in Parkinson's patients.  He also somewhat a new research, out of [employer] on tremor and gait control with external stimulation using gloves in Parkinson's patients.   The patient's allergies, current  medications, family history, past medical history, past social history, past surgical history and problem list were reviewed and updated as appropriate.    Previously (copied from previous notes for reference):    I saw him on 11/15/20, at which time he was on ropinirole but had not noticed any telltale response.  He was on a low dose and I suggested he increase his medication to 1 mg strength and titrate up to 3 mg 3 times daily.     I first met him at the request of his primary care physician on 08/11/2020, at which time he reported a right hand tremor for the past year.  Examination was in keeping with mild right-sided parkinsonism.  I advised him to proceed with a DaTscan.  He had an interim DaTscan on 09/15/2020 and I reviewed the results: IMPRESSION: Asymmetric decreased activity of the LEFT putamen is suggestive of early Parkinsonian syndrome pathology.   Of note, DaTSCAN is not diagnostic of Parkinsonian syndromes, which remains a clinical diagnosis. DaTscan is an adjuvant test to aid in the clinical diagnosis of Parkinsonian syndromes.   We also called him with his test results.  He was agreeable to starting medication for his tremor and I suggested a trial of low-dose Requip with gradual titration.   08/11/20: (He) reports a right hand tremor for the past 1 year. I reviewed your office note from 05/30/2020.  His symptoms started approximately in October 2020.  At the time, he was going through different medication trials for his heart arrhythmias.  He tried flecainide, then propafenone, and mexiletine but eventually came off of all medications.  Since approximately mid May of this year he has been off of mexiletine which was the last medication he was on.  He has been followed by cardiology on a regular basis.  His tremor started in the right hand and was initially intermittent but has progressed.  He initially thought that 1 of these medications was the culprit but even after stopping them he  had an ongoing tremor and in fact it has progressed a little bit in that it is more frequent in occurrence.  It is often at rest.  It does not actually cause him to have any difficulty performing fine motor tasks.  He does not have any significant tremor while feeding himself for using his dominant hand to shave.  He has not noticed any tremor on the left side or lower extremities.  He has not had any balance problems and no falls.  He does endorse shoulder discomfort for the past 3 years on the right side.  He does not have a family history for Parkinson's disease but his dad had a tremor, perhaps only on one side, the patient is not completely sure, dad lived to be 70.  Mom lived to be 66.  The patient has 2 brothers.  He lives with his wife, he is retired, worked in a [employer] operational officer, no kids.  He quit smoking in 1991 and quit drinking alcohol or caffeine when he was first noted to have A. fib in February 2021.  The tremor seems to be a little bit worse first thing in the morning and as the day progresses it seems to be better or less noticeable.  He had a home sleep test _0 years ago and at that time he did not have any obstructive sleep apnea.     His Past Medical History Is Significant For: Past Medical History:  Diagnosis Date   Atrial fibrillation ([place])    Atrial flutter ([place]) 09/27/2016   Overview:  Chads score 0, only an aspirin  Formatting of this note might be different from the original. Chads score 0, only an aspirin   Atypical chest pain 06/30/2018   Cholelithiasis 02/17/2019   Drug-induced erectile dysfunction 12/28/2016   Dyslipidemia 06/11/2017   Dyspnea on exertion 11/20/2019   Dysrhythmia 2001   palpitations -checked out by Cardiologist   GERD (gastroesophageal reflux disease)    High cholesterol    Hypertension    Lumbar radiculopathy 06/08/2021   Major neurocognitive disorder due to Parkinson's disease, possible 08/24/2019   PAC (premature atrial contraction) 05/19/2020    PAF (paroxysmal atrial fibrillation) ([place]) 12/24/2019   Palpitations 09/27/2016   Parkinson disease ([place]) 09/19/2020   Patella fracture 08/05/2015   PONV (postoperative nausea and vomiting)    PVC (premature ventricular contraction) 11/20/2019   Supraventricular tachycardia ([place]) 09/27/2016   Tinnitus 12/28/2019    His Past Surgical History Is Significant For: Past Surgical History:  Procedure Laterality Date   APPENDECTOMY     CHOLECYSTECTOMY     LITHOTRIPSY     x 2   ORIF PATELLA Left 08/05/2015   Procedure: OPEN REDUCTION INTERNAL (ORIF) LEFT  FIXATION PATELLA;  Surgeon: Susa Day, MD;  Location: [facility];  Service: Orthopedics;  Laterality: Left;   STONE EXTRACTION WITH BASKET     x2   TONSILLECTOMY      His Family History Is Significant For: Family History  Problem Relation Age of  Onset   Heart disease Mother    Heart failure Mother    Diabetes Father    Prostate cancer Father    Dementia Father     His Social History Is Significant For: Social History   Socioeconomic History   Marital status: Married    Spouse name: Not on file   Number of children: Not on file   Years of education: Not on file   Highest education level: Not on file  Occupational History   Not on file  Tobacco Use   Smoking status: Former    Packs/day: 1.50    Years: 20.00    Pack years: 30.00    Types: Cigarettes    Quit date: 03/23/1989    Years since quitting: 32.6   Smokeless tobacco: Never  Vaping Use   Vaping Use: Never used  Substance and Sexual Activity   Alcohol use: Not Currently    Comment: none in last 2 years   Drug use: No   Sexual activity: Not on file  Other Topics Concern   Not on file  Social History Narrative   Right handed   Caffeine: none   Lives at home with wife   Social Determinants of Health   Financial Resource Strain: Not on file  Food Insecurity: Not on file  Transportation Needs: Not on file  Physical Activity: Not on file  Stress: Not on file   Social Connections: Not on file    His Allergies Are:  No Known Allergies:   His Current Medications Are:  Outpatient Encounter Medications as of 11/20/2021  Medication Sig   carvedilol (COREG) 3.125 MG tablet Take 1 tablet (3.125 mg total) by mouth 2 (two) times daily with a meal.   diltiazem (CARDIZEM) 30 MG tablet Take 30 mg by mouth as needed (Afib sx's). Take if heart rate goes over 120   ELIQUIS 5 MG TABS tablet TAKE 1 TABLET(5 MG) BY MOUTH TWICE DAILY   gabapentin (NEURONTIN) 300 MG capsule Take 1 capsule by mouth as needed (Neuropathy).   losartan (COZAAR) 50 MG tablet TAKE 1 TABLET(50 MG) BY MOUTH DAILY (Patient taking differently: Take 50 mg by mouth daily.)   magnesium gluconate (MAGONATE) 500 MG tablet Take 400 mg by mouth 2 (two) times daily.    omeprazole (PRILOSEC) 20 MG capsule Take 1 capsule by mouth as needed (Indigestion).   rOPINIRole (REQUIP) 3 MG tablet Take 1 tablet (3 mg total) by mouth 3 (three) times daily.   simvastatin (ZOCOR) 40 MG tablet Take 40 mg by mouth at bedtime.   No facility-administered encounter medications on file as of 11/20/2021.  :  Review of Systems:  Out of a complete 14 point review of systems, all are reviewed and negative with the exception of these symptoms as listed below:   Review of Systems  Neurological:        Patient is here with his wife for follow-up. He states for the most part he is the same but his right hand tremor is a little worse than it was at the last visit.   Objective:  Neurological Exam  Physical Exam Physical Examination:   Vitals:   11/20/21 1039  BP: (!) 142/78  Pulse: (!) 54    General Examination: The patient is a very pleasant 71 y.o. male in no acute distress. He appears well-developed and well-nourished and well groomed.   HEENT: Normocephalic, atraumatic, pupils are equal, round and reactive to light, tracking is well preserved, no nystagmus, corrective  eyeglasses in place. He has hearing aids.  Face is symmetric, minimal facial masking noted, no significant hypophonia or voice tremor, no lip, neck or jaw tremor, mild nuchal rigidity.  No carotid bruits.  Airway examination shows mild airway crowding, tongue protrudes centrally and palate elevates symmetrically, hearing grossly intact.     Chest: Clear to auscultation without wheezing, rhonchi or crackles noted.   Heart: S1+S2+0, regular and normal without murmurs, rubs or gallops noted.   Abdomen: Soft, non-tender and non-distended.   Extremities: There is no pitting edema in the distal lower extremities bilaterally.   Skin: Warm and dry without trophic changes noted.   Musculoskeletal: exam reveals no obvious joint deformities.   Neurologically: Mental status: The patient is awake, alert and oriented in all 4 spheres. His immediate and remote memory, attention, language skills and fund of knowledge are appropriate. There is no evidence of aphasia, agnosia, apraxia or anomia. Speech is clear with normal prosody and enunciation. Thought process is linear. Mood is normal and affect is normal. Cranial nerves II - XII are as described above under HEENT exam.  Motor exam: Normal bulk, strength. There is no drift, or rebound.  He has a slight increase in tone in the right upper extremity.  He has a mild tremor at rest in the right upper extremity, intermittent, no left upper extremity tremor, no lower extremity tremor.  He has no significant postural or action tremor, no intention tremor. Mild impairment of fine motor skills noted in the right upper extremity, stable.   On 08/11/2020: On Archimedes spiral drawing he has no significant trembling with the right hand which is his dominant hand, he has some insecurity with the left hand, no significant trembling.  Handwriting is legible, not tremulous, not micrographic.   Cerebellar testing: No dysmetria or intention tremor. There is no truncal or gait ataxia. Sensory exam: intact to light touch  in the upper and lower extremities. Gait, station and balance: He stands without difficulty, he is able to stand narrow based.  His posture is slightly stooped for age.  He walks with good stride length and pace, decreased arm swing on the right.   Assessment and Plan:    In summary, Alexander Bowen is a very pleasant 71 year old male with an underlying medical history of SVT, PAF, on Eliquis, PVCs and PACs, hypertension, hyperlipidemia, recurrent kidney stones, and reflux disease, who presents for follow-up consultation of his parkinsonism, with right-sided predominance noted, tremor started in the right hand in October 2020. His DaTscan from 09/15/20 showed asymmetric decreased activity of the LEFT putamen, suggestive of early Parkinsonian syndrome pathology. He started low-dose ropinirole with gradual titration in November 2021. He has tolerated the medication at 0.75 mg 3 times daily without obvious side effects but no significant or telltale results.  We increased this to 1 mg pills, and further titrated from 1 mg 3 times daily to up to 3 mg 3 times daily, which he continues to take at this time.  He has no side effects from this, tolerates the medication and has had some symptom control.  We will continue with the current dose, he is agreeable.   He is advised to continue with his healthy lifestyle and physical activity in particular.  He is commended for his positive outlook and healthy lifestyle and exercise regimen.  He has a history of dream enactment behavior which has not been an issue lately and we will continue to monitor.  He is encouraged to go  to the website of academic centers such as [facility] and [place] and see if they are enrolled like actively for Parkinson's disease trials, he may be interested in participating.  He is encouraged to go to the website for further information. He is advised to follow-up routinely in this office in 6 months, sooner if needed.  I answered all their questions  today and the patient and his wife were in agreement. I spent 30 minutes in total face-to-face time and in reviewing records during pre-charting, more than 50% of which was spent in counseling and coordination of care, reviewing test results, reviewing medications and treatment regimen and/or in discussing or reviewing the diagnosis of PD, the prognosis and treatment options. Pertinent laboratory and imaging test results that were available during this visit with the patient were reviewed by me and considered in my medical decision making (see chart for details).

## 2021-12-13 DIAGNOSIS — I471 Supraventricular tachycardia: Secondary | ICD-10-CM | POA: Diagnosis not present

## 2021-12-13 DIAGNOSIS — E782 Mixed hyperlipidemia: Secondary | ICD-10-CM | POA: Diagnosis not present

## 2021-12-13 DIAGNOSIS — I482 Chronic atrial fibrillation, unspecified: Secondary | ICD-10-CM | POA: Diagnosis not present

## 2021-12-18 DIAGNOSIS — R972 Elevated prostate specific antigen [PSA]: Secondary | ICD-10-CM | POA: Diagnosis not present

## 2021-12-25 DIAGNOSIS — Z6827 Body mass index (BMI) 27.0-27.9, adult: Secondary | ICD-10-CM | POA: Diagnosis not present

## 2021-12-25 DIAGNOSIS — R972 Elevated prostate specific antigen [PSA]: Secondary | ICD-10-CM | POA: Diagnosis not present

## 2021-12-25 DIAGNOSIS — J329 Chronic sinusitis, unspecified: Secondary | ICD-10-CM | POA: Diagnosis not present

## 2022-01-10 DIAGNOSIS — I482 Chronic atrial fibrillation, unspecified: Secondary | ICD-10-CM | POA: Diagnosis not present

## 2022-01-10 DIAGNOSIS — E782 Mixed hyperlipidemia: Secondary | ICD-10-CM | POA: Diagnosis not present

## 2022-01-10 DIAGNOSIS — I471 Supraventricular tachycardia: Secondary | ICD-10-CM | POA: Diagnosis not present

## 2022-03-12 DIAGNOSIS — Z1389 Encounter for screening for other disorder: Secondary | ICD-10-CM | POA: Diagnosis not present

## 2022-03-12 DIAGNOSIS — I471 Supraventricular tachycardia: Secondary | ICD-10-CM | POA: Diagnosis not present

## 2022-03-14 DIAGNOSIS — E782 Mixed hyperlipidemia: Secondary | ICD-10-CM | POA: Diagnosis not present

## 2022-03-21 DIAGNOSIS — Z6825 Body mass index (BMI) 25.0-25.9, adult: Secondary | ICD-10-CM | POA: Diagnosis not present

## 2022-03-21 DIAGNOSIS — E782 Mixed hyperlipidemia: Secondary | ICD-10-CM | POA: Diagnosis not present

## 2022-03-21 DIAGNOSIS — I482 Chronic atrial fibrillation, unspecified: Secondary | ICD-10-CM | POA: Diagnosis not present

## 2022-03-21 DIAGNOSIS — R040 Epistaxis: Secondary | ICD-10-CM | POA: Diagnosis not present

## 2022-04-12 DIAGNOSIS — I471 Supraventricular tachycardia: Secondary | ICD-10-CM | POA: Diagnosis not present

## 2022-04-12 DIAGNOSIS — Z1389 Encounter for screening for other disorder: Secondary | ICD-10-CM | POA: Diagnosis not present

## 2022-04-23 ENCOUNTER — Ambulatory Visit: Payer: Medicare Other | Admitting: Cardiology

## 2022-05-06 ENCOUNTER — Other Ambulatory Visit: Payer: Self-pay | Admitting: Neurology

## 2022-05-21 DIAGNOSIS — I781 Nevus, non-neoplastic: Secondary | ICD-10-CM | POA: Diagnosis not present

## 2022-05-21 DIAGNOSIS — L570 Actinic keratosis: Secondary | ICD-10-CM | POA: Diagnosis not present

## 2022-05-21 DIAGNOSIS — Z86008 Personal history of in-situ neoplasm of other site: Secondary | ICD-10-CM | POA: Diagnosis not present

## 2022-05-21 DIAGNOSIS — Z85828 Personal history of other malignant neoplasm of skin: Secondary | ICD-10-CM | POA: Diagnosis not present

## 2022-05-21 DIAGNOSIS — Z129 Encounter for screening for malignant neoplasm, site unspecified: Secondary | ICD-10-CM | POA: Diagnosis not present

## 2022-05-22 ENCOUNTER — Encounter: Payer: Self-pay | Admitting: Neurology

## 2022-05-22 ENCOUNTER — Ambulatory Visit (INDEPENDENT_AMBULATORY_CARE_PROVIDER_SITE_OTHER): Payer: Medicare Other | Admitting: Neurology

## 2022-05-22 VITALS — BP 118/70 | HR 53 | Ht 70.0 in | Wt 186.0 lb

## 2022-05-22 DIAGNOSIS — I480 Paroxysmal atrial fibrillation: Secondary | ICD-10-CM

## 2022-05-22 DIAGNOSIS — G20 Parkinson's disease: Secondary | ICD-10-CM | POA: Diagnosis not present

## 2022-05-22 DIAGNOSIS — R0683 Snoring: Secondary | ICD-10-CM | POA: Diagnosis not present

## 2022-05-22 DIAGNOSIS — Z9189 Other specified personal risk factors, not elsewhere classified: Secondary | ICD-10-CM | POA: Diagnosis not present

## 2022-05-22 NOTE — Patient Instructions (Signed)
It was nice to see you both again today.  You look well and I would like for you to continue your ropinirole 3 mg 3 times daily.  Please continue to stay active mentally and physically but try to pace yourself, do not overexert.  Please think about doing another sleep study to rule out obstructive sleep apnea as there is a connection between obstructive sleep apnea and atrial fibrillation/irregular heartbeat.  You can discuss with your cardiologist and pursue through his office or our office if you prefer.

## 2022-05-22 NOTE — Progress Notes (Signed)
Subjective:    Patient ID: Alexander Bowen is a 71 y.o. male.  HPI    Interim history:   Alexander Bowen is a 71 year old right-handed gentleman with an underlying medical history of SVT, PAF, on Eliquis, PVCs and PACs, hypertension, hyperlipidemia, recurrent kidney stones, and reflux disease, who presents for follow-up consultation of his right-sided Parkinson's disease.  The patient is accompanied by his wife, Alexander Bowen, again today. I last saw him on  Today, 11/20/2021, at which time he reported feeling fairly stable.  He had received a recent epidural injection into the lumbar spine.  He was on ropinirole with good tolerance at 3 mg 3 times daily.  He was advised to continue with this regimen.  Today, 05/22/2022: He reports doing well overall, no recent major issues, no constipation, no recent falls, tries to stay active, is in the gym about 4 days out of the week and also works in the yard, he likes to do cardio at the gym and also weights.  His wife is concerned that sometimes he tends to [facility] himself.  He has had brief episodes of palpitations, likely A-fib and had to take his diltiazem which is prescribed for as needed use.  He had to push out his appointment with his cardiologist due to his provider being out.  He has an appointment next month.  Patient had a sleep study over 10 years ago which was negative for sleep apnea as he recalls.  He does snore loudly per wife.  I recommended that we pursue sleep testing with a home sleep test at least but he would like to double check and discuss first with his cardiologist next month.  He takes his ropinirole at 10 AM, 5 PM and 10 PM daily, it also helps with restless leg symptoms at night.  He has not noticed any significant swelling in the lower extremities.  He has ongoing issues with left-sided low back pain and radiating to the left leg, he may need decompression surgery under Dr. Tonita Cong later this year.   The patient's allergies, current medications,  family history, past medical history, past social history, past surgical history and problem list were reviewed and updated as appropriate.    Previously (copied from previous notes for reference):   I saw him on 05/16/2021, at which time he was fairly stable, he was tolerating the ropinirole, I suggested we change the prescription to 3 mg strength 1 pill 3 times daily.  He had intermittent issues with constipation which were generally under control with magnesium twice daily and taking MiraLAX as needed infrequently.  He was advised to continue with the same dose, continue with his exercise regimen and healthy lifestyle and follow-up routinely in 6 months.    I saw him on 11/15/20, at which time he was on ropinirole but had not noticed any telltale response.  He was on a low dose and I suggested he increase his medication to 1 mg strength and titrate up to 3 mg 3 times daily.     I first met him at the request of his primary care physician on 08/11/2020, at which time he reported a right hand tremor for the past year.  Examination was in keeping with mild right-sided parkinsonism.  I advised him to proceed with a DaTscan.  He had an interim DaTscan on 09/15/2020 and I reviewed the results: IMPRESSION: Asymmetric decreased activity of the LEFT putamen is suggestive of early Parkinsonian syndrome pathology.   Of note, DaTSCAN is not  diagnostic of Parkinsonian syndromes, which remains a clinical diagnosis. DaTscan is an adjuvant test to aid in the clinical diagnosis of Parkinsonian syndromes.   We also called him with his test results.  He was agreeable to starting medication for his tremor and I suggested a trial of low-dose Requip with gradual titration.   08/11/20: (He) reports a right hand tremor for the past 1 year. I reviewed your office note from 05/30/2020.  His symptoms started approximately in October 2020.  At the time, he was going through different medication trials for his heart arrhythmias.   He tried flecainide, then propafenone, and mexiletine but eventually came off of all medications.  Since approximately mid May of this year he has been off of mexiletine which was the last medication he was on.  He has been followed by cardiology on a regular basis.  His tremor started in the right hand and was initially intermittent but has progressed.  He initially thought that 1 of these medications was the culprit but even after stopping them he had an ongoing tremor and in fact it has progressed a little bit in that it is more frequent in occurrence.  It is often at rest.  It does not actually cause him to have any difficulty performing fine motor tasks.  He does not have any significant tremor while feeding himself for using his dominant hand to shave.  He has not noticed any tremor on the left side or lower extremities.  He has not had any balance problems and no falls.  He does endorse shoulder discomfort for the past 3 years on the right side.  He does not have a family history for Parkinson's disease but his dad had a tremor, perhaps only on one side, the patient is not completely sure, dad lived to be 27.  Mom lived to be 76.  The patient has 2 brothers.  He lives with his wife, he is retired, worked in a [employer] operational officer, no kids.  He quit smoking in 1991 and quit drinking alcohol or caffeine when he was first noted to have A. fib in February 2021.  The tremor seems to be a little bit worse first thing in the morning and as the day progresses it seems to be better or less noticeable.  He had a home sleep test '7 4 5 ' years ago and at that time he did not have any obstructive sleep apnea.     His Past Medical History Is Significant For: Past Medical History:  Diagnosis Date   Atrial fibrillation ([place])    Atrial flutter ([place]) 09/27/2016   Overview:  Chads score 0, only an aspirin  Formatting of this note might be different from the original. Chads score 0, only an aspirin   Atypical chest pain  06/30/2018   Cholelithiasis 02/17/2019   Drug-induced erectile dysfunction 12/28/2016   Dyslipidemia 06/11/2017   Dyspnea on exertion 11/20/2019   Dysrhythmia 2001   palpitations -checked out by Cardiologist   GERD (gastroesophageal reflux disease)    High cholesterol    Hypertension    Lumbar radiculopathy 06/08/2021   Major neurocognitive disorder due to Parkinson's disease, possible 08/24/2019   PAC (premature atrial contraction) 05/19/2020   PAF (paroxysmal atrial fibrillation) ([place]) 12/24/2019   Palpitations 09/27/2016   Parkinson disease ([place]) 09/19/2020   Patella fracture 08/05/2015   PONV (postoperative nausea and vomiting)    PVC (premature ventricular contraction) 11/20/2019   Supraventricular tachycardia ([place]) 09/27/2016   Tinnitus 12/28/2019  His Past Surgical History Is Significant For: Past Surgical History:  Procedure Laterality Date   APPENDECTOMY     CHOLECYSTECTOMY     LITHOTRIPSY     x 2   ORIF PATELLA Left 08/05/2015   Procedure: OPEN REDUCTION INTERNAL (ORIF) LEFT  FIXATION PATELLA;  Surgeon: Susa Day, MD;  Location: [facility];  Service: Orthopedics;  Laterality: Left;   STONE EXTRACTION WITH BASKET     x2   TONSILLECTOMY      His Family History Is Significant For: Family History  Problem Relation Age of Onset   Heart disease Mother    Heart failure Mother    Diabetes Father    Prostate cancer Father    Dementia Father     His Social History Is Significant For: Social History   Socioeconomic History   Marital status: Married    Spouse name: Not on file   Number of children: Not on file   Years of education: Not on file   Highest education level: Not on file  Occupational History   Not on file  Tobacco Use   Smoking status: Former    Packs/day: 1.50    Years: 20.00    Total pack years: 30.00    Types: Cigarettes    Quit date: 03/23/1989    Years since quitting: 33.1   Smokeless tobacco: Never  Vaping Use   Vaping Use: Never used  Substance  and Sexual Activity   Alcohol use: Not Currently    Comment: none in last 2 years   Drug use: No   Sexual activity: Not on file  Other Topics Concern   Not on file  Social History Narrative   Right handed   Caffeine: none   Lives at home with wife   Social Determinants of Health   Financial Resource Strain: Not on file  Food Insecurity: Not on file  Transportation Needs: Not on file  Physical Activity: Not on file  Stress: Not on file  Social Connections: Not on file    His Allergies Are:  No Known Allergies:   His Current Medications Are:  Outpatient Encounter Medications as of 05/22/2022  Medication Sig   carvedilol (COREG) 3.125 MG tablet Take 1 tablet (3.125 mg total) by mouth 2 (two) times daily with a meal.   diltiazem (CARDIZEM) 30 MG tablet Take 30 mg by mouth as needed (Afib sx's). Take if heart rate goes over 120   ELIQUIS 5 MG TABS tablet TAKE 1 TABLET(5 MG) BY MOUTH TWICE DAILY   gabapentin (NEURONTIN) 300 MG capsule Take 1 capsule by mouth as needed (Neuropathy).   losartan (COZAAR) 50 MG tablet TAKE 1 TABLET(50 MG) BY MOUTH DAILY (Patient taking differently: Take 50 mg by mouth daily.)   MAGNESIUM GLYCINATE PO Take 400 mg by mouth 2 (two) times daily.   omeprazole (PRILOSEC) 20 MG capsule Take 1 capsule by mouth as needed (Indigestion).   rOPINIRole (REQUIP) 3 MG tablet TAKE 1 TABLET(3 MG) BY MOUTH THREE TIMES DAILY   simvastatin (ZOCOR) 40 MG tablet Take 40 mg by mouth at bedtime.   [DISCONTINUED] magnesium gluconate (MAGONATE) 500 MG tablet Take 400 mg by mouth 2 (two) times daily.    No facility-administered encounter medications on file as of 05/22/2022.  :  Review of Systems:  Out of a complete 14 point review of systems, all are reviewed and negative with the exception of these symptoms as listed below:   Review of Systems  Neurological:  Patient is here for a 6 month follow-up with his wife. He feels overall that his symptoms are stable. He  does not have any concerns to address today.    Objective:  Neurological Exam  Physical Exam Physical Examination:   Vitals:   05/22/22 1047  BP: 118/70  Pulse: (!) 53    General Examination: The patient is a very pleasant 71 y.o. male in no acute distress. He appears well-developed and well-nourished and well groomed.  Good spirits.  HEENT: Normocephalic, atraumatic, pupils are equal, round and reactive to light, tracking is well preserved, no nystagmus, corrective eyeglasses in place. He has hearing aids. Face is symmetric, minimal facial masking noted, no significant hypophonia or voice tremor, no lip, neck or jaw tremor, mild nuchal rigidity.  No carotid bruits.  Airway examination shows mild airway crowding, d/t redundant soft palate.  Tonsils are absent.  Tongue protrudes centrally and palate elevates symmetrically, hearing grossly intact.     Chest: Clear to auscultation without wheezing, rhonchi or crackles noted.   Heart: S1+S2+0, regular and normal without murmurs, rubs or gallops noted.  Mild bradycardia.   Abdomen: Soft, non-tender and non-distended.   Extremities: There is trace pitting edema in the distal lower extremities bilaterally.   Skin: Warm and dry without trophic changes noted.   Musculoskeletal: exam reveals no obvious joint deformities.  Reports low back pain on the left side.   Neurologically: Mental status: The patient is awake, alert and oriented in all 4 spheres. His immediate and remote memory, attention, language skills and fund of knowledge are appropriate. There is no evidence of aphasia, agnosia, apraxia or anomia. Speech is clear with normal prosody and enunciation. Thought process is linear. Mood is normal and affect is normal. Cranial nerves II - XII are as described above under HEENT exam.  Motor exam: Normal bulk, and strength. There is no drift, or rebound.  He has a slight increase in tone in the right upper extremity.  He has a mild tremor  at rest in the right upper extremity, intermittent, no left upper extremity tremor, no lower extremity tremor.  He has no significant postural or action tremor, no intention tremor. Mild impairment of fine motor skills noted in the right upper extremity, stable.   (On 08/11/2020: On Archimedes spiral drawing he has no significant trembling with the right hand which is his dominant hand, he has some insecurity with the left hand, no significant trembling.  Handwriting is legible, not tremulous, not micrographic.)   Cerebellar testing: No dysmetria or intention tremor. There is no truncal or gait ataxia. Sensory exam: intact to light touch in the upper and lower extremities. Gait, station and balance: He stands without difficulty, he is able to stand narrow based.  His posture is mildly stooped for age.  He walks with good stride length and pace, decreased arm swing on the right.  Balance is preserved.   Assessment and Plan:    In summary, Alexander Bowen is a very pleasant 71 year old male with an underlying medical history of SVT, PAF, on Eliquis, PVCs and PACs, hypertension, hyperlipidemia, recurrent kidney stones, and reflux disease, who presents for follow-up consultation of his parkinsonism, with right-sided predominance noted, tremor started in the right hand in October 2020. His DaT scan from 09/15/20 showed asymmetric decreased activity of the LEFT putamen, suggestive of early Parkinsonian syndrome pathology. He started low-dose ropinirole with gradual titration in November 2021. He has tolerated the medication at 0.75 mg 3 times daily  without obvious side effects but no significant or telltale results.  We increased this to 1 mg pills, and further titrated from 1 mg 3 times daily to up to 3 mg 3 times daily, which he continues to take at this time.  He has no side effects from this, but does have trace edema in the lower extremities.  We will continue to monitor and he is advised that ropinirole may  cause lower extremity swelling.  He has benefited from treatment and we will continue at this time.  We talked about sleep apnea evaluation, he is advised to consider doing sleep testing at least in the form of a home sleep test, in light of his paroxysmal A-fib and snoring reported.  He does endorse some daytime somnolence which could, in part, be from the ropinirole as well.  He is advised to continue with his healthy lifestyle and physical activity in particular, but reminded to pace himself.  He is commended for his positive outlook and healthy lifestyle and exercise regimen.  He has a history of dream enactment behavior which has not been an issue lately and we will continue to monitor.  He is advised to follow-up routinely in this office in 6 months to see one of our nurse practitioners, sooner if needed.  I answered all their questions today and the patient and his wife were in agreement. I spent 40 minutes in total face-to-face time and in reviewing records during pre-charting, more than 50% of which was spent in counseling and coordination of care, reviewing test results, reviewing medications and treatment regimen and/or in discussing or reviewing the diagnosis of PD, the prognosis and treatment options. Pertinent laboratory and imaging test results that were available during this visit with the patient were reviewed by me and considered in my medical decision making (see chart for details).

## 2022-05-30 ENCOUNTER — Other Ambulatory Visit: Payer: Self-pay | Admitting: Cardiology

## 2022-06-24 ENCOUNTER — Other Ambulatory Visit: Payer: Self-pay | Admitting: Cardiology

## 2022-06-25 NOTE — Telephone Encounter (Signed)
Prescription refill request for Eliquis received. Indication:Afib Last office visit:12/22 Scr:1.1 Age: 71 Weight:84.4 kg  Prescription refilled

## 2022-07-10 DIAGNOSIS — H524 Presbyopia: Secondary | ICD-10-CM | POA: Diagnosis not present

## 2022-07-10 DIAGNOSIS — H25813 Combined forms of age-related cataract, bilateral: Secondary | ICD-10-CM | POA: Diagnosis not present

## 2022-07-13 DIAGNOSIS — I471 Supraventricular tachycardia: Secondary | ICD-10-CM | POA: Diagnosis not present

## 2022-07-13 DIAGNOSIS — E782 Mixed hyperlipidemia: Secondary | ICD-10-CM | POA: Diagnosis not present

## 2022-07-13 DIAGNOSIS — I482 Chronic atrial fibrillation, unspecified: Secondary | ICD-10-CM | POA: Diagnosis not present

## 2022-08-08 ENCOUNTER — Encounter: Payer: Self-pay | Admitting: Cardiology

## 2022-08-08 ENCOUNTER — Ambulatory Visit: Payer: Medicare Other | Attending: Cardiology | Admitting: Cardiology

## 2022-08-08 VITALS — BP 122/60 | HR 55 | Ht 70.0 in | Wt 187.0 lb

## 2022-08-08 DIAGNOSIS — I471 Supraventricular tachycardia: Secondary | ICD-10-CM | POA: Insufficient documentation

## 2022-08-08 DIAGNOSIS — I480 Paroxysmal atrial fibrillation: Secondary | ICD-10-CM | POA: Insufficient documentation

## 2022-08-08 DIAGNOSIS — G20 Parkinson's disease: Secondary | ICD-10-CM | POA: Diagnosis not present

## 2022-08-08 NOTE — Progress Notes (Unsigned)
Cardiology Office Note:    Date:  08/08/2022   ID:  Alexander Bowen, DOB Dec 15, 1950, MRN [id number]  PCP:  Maryella Shivers, MD  Cardiologist:  Jenne Campus, MD    Referring MD: Maryella Shivers, MD   Chief Complaint  Patient presents with   Follow-up  Doing fine  History of Present Illness:    Alexander Bowen is a 71 y.o. male with past medical history significant for V PVCs PACs paroxysmal atrial fibrillation, parkinsonian, dyslipidemia.  He is in my office today for follow-up.  Overall he is doing well.  He described very rare episodes happening maybe every 2 months for few minutes he also describes episode that he hold his breath and that it goes away he does have Parkinson in spite of that he goes to gym and exercise on the regular basis seems to be doing well from that point review  Past Medical History:  Diagnosis Date   Atrial fibrillation ([place])    Atrial flutter ([place]) 09/27/2016   Overview:  Chads score 0, only an aspirin  Formatting of this note might be different from the original. Chads score 0, only an aspirin   Atypical chest pain 06/30/2018   Cholelithiasis 02/17/2019   Drug-induced erectile dysfunction 12/28/2016   Dyslipidemia 06/11/2017   Dyspnea on exertion 11/20/2019   Dysrhythmia 2001   palpitations -checked out by Cardiologist   GERD (gastroesophageal reflux disease)    High cholesterol    Hypertension    Lumbar radiculopathy 06/08/2021   Major neurocognitive disorder due to Parkinson's disease, possible 08/24/2019   PAC (premature atrial contraction) 05/19/2020   PAF (paroxysmal atrial fibrillation) ([place]) 12/24/2019   Palpitations 09/27/2016   Parkinson disease ([place]) 09/19/2020   Patella fracture 08/05/2015   PONV (postoperative nausea and vomiting)    PVC (premature ventricular contraction) 11/20/2019   Supraventricular tachycardia ([place]) 09/27/2016   Tinnitus 12/28/2019    Past Surgical History:  Procedure Laterality Date   APPENDECTOMY      CHOLECYSTECTOMY     LITHOTRIPSY     x 2   ORIF PATELLA Left 08/05/2015   Procedure: OPEN REDUCTION INTERNAL (ORIF) LEFT  FIXATION PATELLA;  Surgeon: Susa Day, MD;  Location: [facility];  Service: Orthopedics;  Laterality: Left;   STONE EXTRACTION WITH BASKET     x2   TONSILLECTOMY      Current Medications: Current Meds  Medication Sig   carvedilol (COREG) 3.125 MG tablet Take 1 tablet (3.125 mg total) by mouth 2 (two) times daily with a meal.   diltiazem (CARDIZEM) 30 MG tablet Take 30 mg by mouth as needed (Afib sx's). Take if heart rate goes over 120   ELIQUIS 5 MG TABS tablet TAKE 1 TABLET(5 MG) BY MOUTH TWICE DAILY (Patient taking differently: Take 5 mg by mouth 2 (two) times daily.)   gabapentin (NEURONTIN) 300 MG capsule Take 1 capsule by mouth as needed (Neuropathy).   losartan (COZAAR) 50 MG tablet Take 1 tablet (50 mg total) by mouth daily.   MAGNESIUM GLYCINATE PO Take 400 mg by mouth 2 (two) times daily.   omeprazole (PRILOSEC) 20 MG capsule Take 1 capsule by mouth as needed (Indigestion).   rOPINIRole (REQUIP) 3 MG tablet TAKE 1 TABLET(3 MG) BY MOUTH THREE TIMES DAILY (Patient taking differently: Take 3 mg by mouth at bedtime.)   simvastatin (ZOCOR) 40 MG tablet Take 40 mg by mouth at bedtime.     Allergies:   Patient has no known allergies.   Social History  Socioeconomic History   Marital status: Married    Spouse name: Not on file   Number of children: Not on file   Years of education: Not on file   Highest education level: Not on file  Occupational History   Not on file  Tobacco Use   Smoking status: Former    Packs/day: 1.50    Years: 20.00    Total pack years: 30.00    Types: Cigarettes    Quit date: 03/23/1989    Years since quitting: 33.4   Smokeless tobacco: Never  Vaping Use   Vaping Use: Never used  Substance and Sexual Activity   Alcohol use: Not Currently    Comment: none in last 2 years   Drug use: No   Sexual activity: Not on file  Other  Topics Concern   Not on file  Social History Narrative   Right handed   Caffeine: none   Lives at home with wife   Social Determinants of Health   Financial Resource Strain: Not on file  Food Insecurity: Not on file  Transportation Needs: Not on file  Physical Activity: Not on file  Stress: Not on file  Social Connections: Not on file     Family History: The patient's family history includes Dementia in his father; Diabetes in his father; Heart disease in his mother; Heart failure in his mother; Prostate cancer in his father. ROS:   Please see the history of present illness.    All 14 point review of systems negative except as described per history of present illness  EKGs/Labs/Other Studies Reviewed:      Recent Labs: No results found for requested labs within last 365 days.  Recent Lipid Panel No results found for: "CHOL", "TRIG", "HDL", "CHOLHDL", "VLDL", "LDLCALC", "LDLDIRECT"  Physical Exam:    VS:  BP 122/60 (BP Location: Left Arm, Patient Position: Sitting)   Pulse (!) 55   Ht '5\' 10"'$  (1.778 m)   Wt 187 lb (84.8 kg)   SpO2 99%   BMI 26.83 kg/m     Wt Readings from Last 3 Encounters:  08/08/22 187 lb (84.8 kg)  05/22/22 186 lb (84.4 kg)  11/20/21 189 lb (85.7 kg)     GEN:  Well nourished, well developed in no acute distress HEENT: Normal NECK: No JVD; No carotid bruits LYMPHATICS: No lymphadenopathy CARDIAC: RRR, no murmurs, no rubs, no gallops RESPIRATORY:  Clear to auscultation without rales, wheezing or rhonchi  ABDOMEN: Soft, non-tender, non-distended MUSCULOSKELETAL:  No edema; No deformity  SKIN: Warm and dry LOWER EXTREMITIES: no swelling NEUROLOGIC:  Alert and oriented x 3 PSYCHIATRIC:  Normal affect   ASSESSMENT:    1. PAF (paroxysmal atrial fibrillation) ([place])   2. Supraventricular tachycardia ([facility])   3. Parkinson disease ([place])    PLAN:    In order of problems listed above:  Paroxysmal atrial fibrillation, he is anticoagulant which  I continue. Supraventricular tachycardia described to have some episodes difficult to tell if this is atrial fibrillation or supraventricular tachycardia regardless look like he is able to break it with Valsalva maneuver which make me to think that maybe this supraventricular tachycardia we were getting ready to start antiarrhythmic medication with him however he does not want to he said he is feeling fine Parkinsonian: Followed by neurology.  Stable. Dyslipidemia I did review his K PN which show LDL 82 HDL 44.  We will continue present management which include Zocor 40 which is moderate dose statin   Medication  Adjustments/Labs and Tests Ordered: Current medicines are reviewed at length with the patient today.  Concerns regarding medicines are outlined above.  No orders of the defined types were placed in this encounter.  Medication changes: No orders of the defined types were placed in this encounter.   Signed, Park Liter, MD, [facility] 08/08/2022 2:31 PM    [place]

## 2022-08-08 NOTE — Patient Instructions (Signed)
Medication Instructions:  Your physician recommends that you continue on your current medications as directed. Please refer to the Current Medication list given to you today.  *If you need a refill on your cardiac medications before your next appointment, please call your pharmacy*   Lab Work: None If you have labs (blood work) drawn today and your tests are completely normal, you will receive your results only by: MyChart Message (if you have MyChart) OR A paper copy in the mail If you have any lab test that is abnormal or we need to change your treatment, we will call you to review the results.   Testing/Procedures: None   Follow-Up: At [facility], you and your health needs are our priority.  As part of our continuing mission to provide you with exceptional heart care, we have created designated Provider Care Teams.  These Care Teams include your primary Cardiologist (physician) and Advanced Practice Providers (APPs -  Physician Assistants and Nurse Practitioners) who all work together to provide you with the care you need, when you need it.  We recommend signing up for the patient portal called "MyChart".  Sign up information is provided on this After Visit Summary.  MyChart is used to connect with patients for Virtual Visits (Telemedicine).  Patients are able to view lab/test results, encounter notes, upcoming appointments, etc.  Non-urgent messages can be sent to your provider as well.   To learn more about what you can do with MyChart, go to [email or web address].    Your next appointment:   6 month(s)  The format for your next appointment:   In Person  Provider:   Robert Krasowski, MD    Other Instructions None  Important Information About Sugar        

## 2022-09-03 ENCOUNTER — Other Ambulatory Visit: Payer: Self-pay | Admitting: Cardiology

## 2022-09-12 DIAGNOSIS — I482 Chronic atrial fibrillation, unspecified: Secondary | ICD-10-CM | POA: Diagnosis not present

## 2022-09-12 DIAGNOSIS — E782 Mixed hyperlipidemia: Secondary | ICD-10-CM | POA: Diagnosis not present

## 2022-09-24 DIAGNOSIS — R059 Cough, unspecified: Secondary | ICD-10-CM | POA: Diagnosis not present

## 2022-09-24 DIAGNOSIS — Z20822 Contact with and (suspected) exposure to covid-19: Secondary | ICD-10-CM | POA: Diagnosis not present

## 2022-09-26 DIAGNOSIS — E782 Mixed hyperlipidemia: Secondary | ICD-10-CM | POA: Diagnosis not present

## 2022-09-27 ENCOUNTER — Other Ambulatory Visit: Payer: Self-pay | Admitting: Cardiology

## 2022-10-12 DIAGNOSIS — Z139 Encounter for screening, unspecified: Secondary | ICD-10-CM | POA: Diagnosis not present

## 2022-10-12 DIAGNOSIS — M67432 Ganglion, left wrist: Secondary | ICD-10-CM | POA: Diagnosis not present

## 2022-10-12 DIAGNOSIS — Z23 Encounter for immunization: Secondary | ICD-10-CM | POA: Diagnosis not present

## 2022-10-12 DIAGNOSIS — Z0000 Encounter for general adult medical examination without abnormal findings: Secondary | ICD-10-CM | POA: Diagnosis not present

## 2022-10-12 DIAGNOSIS — Z1339 Encounter for screening examination for other mental health and behavioral disorders: Secondary | ICD-10-CM | POA: Diagnosis not present

## 2022-10-12 DIAGNOSIS — Z6826 Body mass index (BMI) 26.0-26.9, adult: Secondary | ICD-10-CM | POA: Diagnosis not present

## 2022-10-12 DIAGNOSIS — E782 Mixed hyperlipidemia: Secondary | ICD-10-CM | POA: Diagnosis not present

## 2022-10-12 DIAGNOSIS — Z1331 Encounter for screening for depression: Secondary | ICD-10-CM | POA: Diagnosis not present

## 2022-10-12 DIAGNOSIS — Z136 Encounter for screening for cardiovascular disorders: Secondary | ICD-10-CM | POA: Diagnosis not present

## 2022-10-12 DIAGNOSIS — I482 Chronic atrial fibrillation, unspecified: Secondary | ICD-10-CM | POA: Diagnosis not present

## 2022-10-19 ENCOUNTER — Other Ambulatory Visit: Payer: Self-pay | Admitting: Cardiology

## 2022-11-14 ENCOUNTER — Encounter: Payer: Self-pay | Admitting: Family Medicine

## 2022-11-14 ENCOUNTER — Ambulatory Visit (INDEPENDENT_AMBULATORY_CARE_PROVIDER_SITE_OTHER): Payer: Medicare Other | Admitting: Family Medicine

## 2022-11-14 VITALS — BP 141/71 | HR 58 | Ht 70.0 in | Wt 187.5 lb

## 2022-11-14 DIAGNOSIS — R0683 Snoring: Secondary | ICD-10-CM | POA: Diagnosis not present

## 2022-11-14 DIAGNOSIS — G20A1 Parkinson's disease without dyskinesia, without mention of fluctuations: Secondary | ICD-10-CM | POA: Diagnosis not present

## 2022-11-14 DIAGNOSIS — I480 Paroxysmal atrial fibrillation: Secondary | ICD-10-CM

## 2022-11-14 MED ORDER — ROPINIROLE HCL 3 MG PO TABS: 3.0000 mg | ORAL_TABLET | Freq: Three times a day (TID) | ORAL | 3 refills | Status: DC

## 2022-11-14 NOTE — Patient Instructions (Signed)
Below is our plan:  We will continue ropinirole '3mg'$  three times daily. Please continue to monitor for any concerns of apneic events. Remain active.   Please make sure you are staying well hydrated. I recommend 50-60 ounces daily. Well balanced diet and regular exercise encouraged. Consistent sleep schedule with 6-8 hours recommended.   Please continue follow up with care team as directed.   Follow up with me in 6 months   You may receive a survey regarding today's visit. I encourage you to leave honest feed back as I do use this information to improve patient care. Thank you for seeing me today!

## 2022-11-14 NOTE — Progress Notes (Signed)
Chief Complaint  Patient presents with   Room 2    Pt is here Alone. Pt states that he shakes a little bit. Pt states no muscle weakness. Pt goes to the gym 3-4 days per weak.     HISTORY OF PRESENT ILLNESS:  11/14/22 ALL:  Alexander Bowen is a 72 y.o. male here today for follow up for right sided PD.He was last seen by Dr Rexene Alberts 05/2022 and doing well on ropinirole '3mg'$  TID. HST discussed due to loud snoring, however, he wished to discuss with cardiology. Since, he reports doing well. He continues ropinirole dosing at 11a, 5p and 11p. He is tolerating well with minimal sleepiness after evening dosing. RLS is stable. Right hand tremor is mild. He remains very active. He goes to the gym for weight training and cardio 3-4 times a week. He enjoys working in his yard and woodworking. He denies changes in gait. No trouble with balance. No difficulty swallowing. Memory is good. He does feel it may take him a little more time to process complex information but not overly bothered by this.   He continues to snore but denies concerns of apneic events. He had a normal HST in the past and does not wish to repeat at this time. Afib is stable. He has not taken Cardizem in 8-9 months. He continues Eliquis.   HISTORY (copied from Dr Guadelupe Sabin previous note)  Alexander Bowen is a 72 year old right-handed gentleman with an underlying medical history of SVT, PAF, on Eliquis, PVCs and PACs, hypertension, hyperlipidemia, recurrent kidney stones, and reflux disease, who presents for follow-up consultation of his right-sided Parkinson's disease.  The patient is accompanied by his wife, Alexander Bowen, again today. I last saw him on  Today, 11/20/2021, at which time he reported feeling fairly stable.  He had received a recent epidural injection into the lumbar spine.  He was on ropinirole with good tolerance at 3 mg 3 times daily.  He was advised to continue with this regimen.   Today, 05/22/2022: He reports doing well overall, no  recent major issues, no constipation, no recent falls, tries to stay active, is in the gym about 4 days out of the week and also works in the yard, he likes to do cardio at the gym and also weights.  His wife is concerned that sometimes he tends to [facility] himself.  He has had brief episodes of palpitations, likely A-fib and had to take his diltiazem which is prescribed for as needed use.  He had to push out his appointment with his cardiologist due to his provider being out.  He has an appointment next month.  Patient had a sleep study over 10 years ago which was negative for sleep apnea as he recalls.  He does snore loudly per wife.  I recommended that we pursue sleep testing with a home sleep test at least but he would like to double check and discuss first with his cardiologist next month.  He takes his ropinirole at 10 AM, 5 PM and 10 PM daily, it also helps with restless leg symptoms at night.  He has not noticed any significant swelling in the lower extremities.  He has ongoing issues with left-sided low back pain and radiating to the left leg, he may need decompression surgery under Dr. Tonita Cong later this year.   REVIEW OF SYSTEMS: Out of a complete 14 system review of symptoms, the patient complains only of the following symptoms, right hand tremor and all other  reviewed systems are negative.   ALLERGIES: No Known Allergies   HOME MEDICATIONS: Outpatient Medications Prior to Visit  Medication Sig Dispense Refill   carvedilol (COREG) 3.125 MG tablet Take 1 tablet (3.125 mg total) by mouth 2 (two) times daily with a meal. 180 tablet 2   diltiazem (CARDIZEM) 30 MG tablet Take 30 mg by mouth as needed (Afib sx's). Take if heart rate goes over 120     ELIQUIS 5 MG TABS tablet TAKE 1 TABLET(5 MG) BY MOUTH TWICE DAILY (Patient taking differently: Take 5 mg by mouth 2 (two) times daily.) 180 tablet 1   gabapentin (NEURONTIN) 300 MG capsule Take 1 capsule by mouth as needed (Neuropathy).      losartan (COZAAR) 50 MG tablet TAKE 1 TABLET(50 MG) BY MOUTH DAILY 90 tablet 2   MAGNESIUM GLYCINATE PO Take 400 mg by mouth 2 (two) times daily.     omeprazole (PRILOSEC) 20 MG capsule Take 1 capsule by mouth as needed (Indigestion).     simvastatin (ZOCOR) 40 MG tablet Take 40 mg by mouth at bedtime.     rOPINIRole (REQUIP) 3 MG tablet TAKE 1 TABLET(3 MG) BY MOUTH THREE TIMES DAILY (Patient taking differently: Take 3 mg by mouth at bedtime.) 270 tablet 3   No facility-administered medications prior to visit.     PAST MEDICAL HISTORY: Past Medical History:  Diagnosis Date   Atrial fibrillation [facility])    Atrial flutter ([place]) 09/27/2016   Overview:  Chads score 0, only an aspirin  Formatting of this note might be different from the original. Chads score 0, only an aspirin   Atypical chest pain 06/30/2018   Cholelithiasis 02/17/2019   Drug-induced erectile dysfunction 12/28/2016   Dyslipidemia 06/11/2017   Dyspnea on exertion 11/20/2019   Dysrhythmia 2001   palpitations -checked out by Cardiologist   GERD (gastroesophageal reflux disease)    High cholesterol    Hypertension    Lumbar radiculopathy 06/08/2021   Major neurocognitive disorder due to Parkinson's disease, possible 08/24/2019   PAC (premature atrial contraction) 05/19/2020   PAF (paroxysmal atrial fibrillation) ([place]) 12/24/2019   Palpitations 09/27/2016   Parkinson disease 09/19/2020   Patella fracture 08/05/2015   PONV (postoperative nausea and vomiting)    PVC (premature ventricular contraction) 11/20/2019   Supraventricular tachycardia 09/27/2016   Tinnitus 12/28/2019     PAST SURGICAL HISTORY: Past Surgical History:  Procedure Laterality Date   APPENDECTOMY     CHOLECYSTECTOMY     LITHOTRIPSY     x 2   ORIF PATELLA Left 08/05/2015   Procedure: OPEN REDUCTION INTERNAL (ORIF) LEFT  FIXATION PATELLA;  Surgeon: Susa Day, MD;  Location: [facility];  Service: Orthopedics;  Laterality: Left;   STONE EXTRACTION WITH BASKET      x2   TONSILLECTOMY       FAMILY HISTORY: Family History  Problem Relation Age of Onset   Heart disease Mother    Heart failure Mother    Diabetes Father    Prostate cancer Father    Dementia Father      SOCIAL HISTORY: Social History   Socioeconomic History   Marital status: Married    Spouse name: Not on file   Number of children: Not on file   Years of education: Not on file   Highest education level: Not on file  Occupational History   Not on file  Tobacco Use   Smoking status: Former    Packs/day: 1.50    Years: 20.00  Total pack years: 30.00    Types: Cigarettes    Quit date: 03/23/1989    Years since quitting: 33.6   Smokeless tobacco: Never  Vaping Use   Vaping Use: Never used  Substance and Sexual Activity   Alcohol use: Not Currently    Comment: none in last 2 years   Drug use: No   Sexual activity: Not on file  Other Topics Concern   Not on file  Social History Narrative   Right handed   Caffeine: none   Lives at home with wife   Social Determinants of Health   Financial Resource Strain: Not on file  Food Insecurity: Not on file  Transportation Needs: Not on file  Physical Activity: Not on file  Stress: Not on file  Social Connections: Not on file  Intimate Partner Violence: Not on file     PHYSICAL EXAM  Vitals:   11/14/22 1008  BP: (!) 141/71  Pulse: (!) 58  Weight: 187 lb 8 oz (85 kg)  Height: '5\' 10"'$  (1.778 m)   Body mass index is 26.9 kg/m.  Generalized: Well developed, in no acute distress  Cardiology: normal rate and rhythm, no murmur auscultated  Respiratory: clear to auscultation bilaterally    Neurological examination  Mentation: Alert oriented to time, place, history taking. Follows all commands speech and language fluent Cranial nerve II-XII: Pupils were equal round reactive to light. Extraocular movements were full, visual field were full on confrontational test. Facial sensation and strength were normal. Uvula  tongue midline. Head turning and shoulder shrug  were normal and symmetric. Motor: The motor testing reveals 5 over 5 strength of all 4 extremities. Good symmetric motor tone is noted throughout. No bradykinesia or cogwheel rigidity. Slight resting tremor of right hand noted.  Sensory: Sensory testing is intact to soft touch on all 4 extremities. No evidence of extinction is noted.  Coordination: Cerebellar testing reveals good finger-nose-finger and heel-to-shin bilaterally.  Gait and station: Able to stand unassisted. Mildly stooped posture. Gait is narrow based with decreased right arm swing.  Reflexes: Deep tendon reflexes are symmetric and normal bilaterally.    DIAGNOSTIC DATA (LABS, IMAGING, TESTING) - I reviewed patient records, labs, notes, testing and imaging myself where available.  Lab Results  Component Value Date   WBC 8.6 08/04/2015   HGB 15.3 08/04/2015   HCT 43.4 08/04/2015   MCV 89.5 08/04/2015   PLT 266 08/04/2015      Component Value Date/Time   NA 140 10/28/2019 1346   K 5.3 (H) 10/28/2019 1346   CL 103 10/28/2019 1346   CO2 26 10/28/2019 1346   GLUCOSE 84 10/28/2019 1346   GLUCOSE 120 (H) 08/06/2015 0500   BUN 14 10/28/2019 1346   CREATININE 1.10 10/28/2019 1346   CALCIUM 10.0 10/28/2019 1346   GFRNONAA 68 10/28/2019 1346   GFRAA 78 10/28/2019 1346   No results found for: "CHOL", "HDL", "LDLCALC", "LDLDIRECT", "TRIG", "CHOLHDL" No results found for: "HGBA1C" No results found for: "VITAMINB12" Lab Results  Component Value Date   TSH 1.230 10/01/2019        No data to display               No data to display           ASSESSMENT AND PLAN  72 y.o. year old male  has a past medical history of Atrial fibrillation ([place]), Atrial flutter ([place]) (09/27/2016), Atypical chest pain (06/30/2018), Cholelithiasis (02/17/2019), Drug-induced erectile dysfunction (12/28/2016), Dyslipidemia (06/11/2017),  Dyspnea on exertion (11/20/2019), Dysrhythmia (2001), GERD  (gastroesophageal reflux disease), High cholesterol, Hypertension, Lumbar radiculopathy (06/08/2021), Major neurocognitive disorder due to Parkinson's disease, possible (08/24/2019), PAC (premature atrial contraction) (05/19/2020), PAF (paroxysmal atrial fibrillation) ([place]) (12/24/2019), Palpitations (09/27/2016), Parkinson disease (09/19/2020), Patella fracture (08/05/2015), PONV (postoperative nausea and vomiting), PVC (premature ventricular contraction) (11/20/2019), Supraventricular tachycardia (09/27/2016), and Tinnitus (12/28/2019). here with    Parkinson's disease without dyskinesia or fluctuating manifestations  Snoring  PAF (paroxysmal atrial fibrillation) ([place])  Alexander Bowen is doing well, today. Tremor remains mild. He will continue ropinirole '3mg'$  three times daily. I have commended him on his activity and encouraged him to remain active. We have discussed HST, however, he does not feel this is needed at this time. He does snore but denies apneic episodes and no EDS. We will continue to monitor. Healthy lifestyle habits encouraged. He will follow up with PCP as directed. He will return to see me in 6 months, sooner if needed. He verbalizes understanding and agreement with this plan.   No orders of the defined types were placed in this encounter.    Meds ordered this encounter  Medications   rOPINIRole (REQUIP) 3 MG tablet    Sig: Take 1 tablet (3 mg total) by mouth 3 (three) times daily.    Dispense:  270 tablet    Refill:  3    Order Specific Question:   Supervising Provider    Answer:   Melvenia Beam [[id number]]     Debbora Presto, MSN, FNP-C 11/14/2022, 11:21 AM  [facility] [address], [place], [place] [address] [phone]

## 2022-11-22 ENCOUNTER — Ambulatory Visit: Payer: Medicare Other | Admitting: Family Medicine

## 2023-01-15 ENCOUNTER — Ambulatory Visit: Payer: Medicare Other | Admitting: Family Medicine

## 2023-02-17 ENCOUNTER — Other Ambulatory Visit: Payer: Self-pay | Admitting: Cardiology

## 2023-02-17 DIAGNOSIS — I480 Paroxysmal atrial fibrillation: Secondary | ICD-10-CM

## 2023-02-18 NOTE — Telephone Encounter (Signed)
Prescription refill request for Eliquis received. Indication: Afib  Last office visit: 08/08/22 Bing Matter) Scr: 0.99 (01/12/23) Age: 72 Weight: 85kg  Appropriate dose. Refill sent.

## 2023-05-29 NOTE — Progress Notes (Signed)
Chief Complaint  Patient presents with   Follow-up    Pt in room 2., wife in room. Here for Parkinson follow up. Pt said tremors are worse than before, stiffness in right forearm still there not new. Pt is interested in any clinical trials that you know any.    HISTORY OF PRESENT ILLNESS:  05/30/23 ALL:  Alexander Bowen returns for follow up for PD. I last saw him 11/2022 and we continued ropinirole 3mg  TID (11am, 5pm, 11pm). HST declined. Since, he reports doing fairly well. He feels tremor has gradual worsened. Does not notice as much if he can stay active. He is unsure if ropinirole helps as much with tremor but does note significant benefit with RLS symptoms. He does note more stiffness of the right arm. He is enrolled with [employer]. He has He is also a member of [facility] Parkinson's Support Group. They meet once once a month. He is sleeping well. Appetite is good. No trouble swallowing. Gait is stable. No assistive device. No falls. He is interested in research possibilities.   11/14/2022 ALL: [name] is a 72 y.o. male here today for follow up for right sided PD.He was last seen by Dr Frances Furbish 05/2022 and doing well on ropinirole 3mg  TID. HST discussed due to loud snoring, however, he wished to discuss with cardiology. Since, he reports doing well. He continues ropinirole dosing at 11a, 5p and 11p. He is tolerating well with minimal sleepiness after evening dosing. RLS is stable. Right hand tremor is mild. He remains very active. He goes to the gym for weight training and cardio 3-4 times a week. He enjoys working in his yard and woodworking. He denies changes in gait. No trouble with balance. No difficulty swallowing. Memory is good. He does feel it may take him a little more time to process complex information but not overly bothered by this.   He continues to snore but denies concerns of apneic events. He had a normal HST in the past and does not wish to repeat at this time. Afib is  stable. He has not taken Cardizem in 8-9 months. He continues Eliquis.   HISTORY (copied from Dr Teofilo Pod previous note)  [name] is a 71 year old right-handed gentleman with an underlying medical history of SVT, PAF, on Eliquis, PVCs and PACs, hypertension, hyperlipidemia, recurrent kidney stones, and reflux disease, who presents for follow-up consultation of his right-sided Parkinson's disease.  The patient is accompanied by his wife, Alexander Bowen, again today. I last saw him on  Today, 11/20/2021, at which time he reported feeling fairly stable.  He had received a recent epidural injection into the lumbar spine.  He was on ropinirole with good tolerance at 3 mg 3 times daily.  He was advised to continue with this regimen.   Today, 05/22/2022: He reports doing well overall, no recent major issues, no constipation, no recent falls, tries to stay active, is in the gym about 4 days out of the week and also works in the yard, he likes to do cardio at the gym and also weights.  His wife is concerned that sometimes he tends to [facility] himself.  He has had brief episodes of palpitations, likely A-fib and had to take his diltiazem which is prescribed for as needed use.  He had to push out his appointment with his cardiologist due to his provider being out.  He has an appointment next month.  Patient had a sleep study over 10 years ago which  was negative for sleep apnea as he recalls.  He does snore loudly per wife.  I recommended that we pursue sleep testing with a home sleep test at least but he would like to double check and discuss first with his cardiologist next month.  He takes his ropinirole at 10 AM, 5 PM and 10 PM daily, it also helps with restless leg symptoms at night.  He has not noticed any significant swelling in the lower extremities.  He has ongoing issues with left-sided low back pain and radiating to the left leg, he may need decompression surgery under Dr. Shelle Iron later this year.   REVIEW OF SYSTEMS:  Out of a complete 14 system review of symptoms, the patient complains only of the following symptoms, right hand tremor , right arm stiffness and all other reviewed systems are negative.   ALLERGIES: No Known Allergies   HOME MEDICATIONS: Outpatient Medications Prior to Visit  Medication Sig Dispense Refill   carvedilol (COREG) 3.125 MG tablet Take 1 tablet (3.125 mg total) by mouth 2 (two) times daily with a meal. 180 tablet 2   diltiazem (CARDIZEM) 30 MG tablet Take 30 mg by mouth as needed (Afib sx's). Take if heart rate goes over 120     ELIQUIS 5 MG TABS tablet TAKE 1 TABLET(5 MG) BY MOUTH TWICE DAILY 180 tablet 1   gabapentin (NEURONTIN) 300 MG capsule Take 1 capsule by mouth as needed (Neuropathy).     losartan (COZAAR) 50 MG tablet TAKE 1 TABLET(50 MG) BY MOUTH DAILY 90 tablet 2   MAGNESIUM GLYCINATE PO Take 400 mg by mouth 2 (two) times daily.     omeprazole (PRILOSEC) 20 MG capsule Take 1 capsule by mouth as needed (Indigestion).     simvastatin (ZOCOR) 40 MG tablet Take 40 mg by mouth at bedtime.     rOPINIRole (REQUIP) 3 MG tablet Take 1 tablet (3 mg total) by mouth 3 (three) times daily. 270 tablet 3   No facility-administered medications prior to visit.     PAST MEDICAL HISTORY: Past Medical History:  Diagnosis Date   Atrial fibrillation [facility])    Atrial flutter (HCC) 09/27/2016   Overview:  Chads score 0, only an aspirin  Formatting of this note might be different from the original. Chads score 0, only an aspirin   Atypical chest pain 06/30/2018   Cholelithiasis 02/17/2019   Drug-induced erectile dysfunction 12/28/2016   Dyslipidemia 06/11/2017   Dyspnea on exertion 11/20/2019   Dysrhythmia 2001   palpitations -checked out by Cardiologist   GERD (gastroesophageal reflux disease)    High cholesterol    Hypertension    Lumbar radiculopathy 06/08/2021   Major neurocognitive disorder due to Parkinson's disease, possible 08/24/2019   PAC (premature atrial contraction)  05/19/2020   PAF (paroxysmal atrial fibrillation) (HCC) 12/24/2019   Palpitations 09/27/2016   Parkinson disease 09/19/2020   Patella fracture 08/05/2015   PONV (postoperative nausea and vomiting)    PVC (premature ventricular contraction) 11/20/2019   Supraventricular tachycardia 09/27/2016   Tinnitus 12/28/2019     PAST SURGICAL HISTORY: Past Surgical History:  Procedure Laterality Date   APPENDECTOMY     CHOLECYSTECTOMY     LITHOTRIPSY     x 2   ORIF PATELLA Left 08/05/2015   Procedure: OPEN REDUCTION INTERNAL (ORIF) LEFT  FIXATION PATELLA;  Surgeon: Jene Every, MD;  Location: [facility];  Service: Orthopedics;  Laterality: Left;   STONE EXTRACTION WITH BASKET     x2   TONSILLECTOMY  FAMILY HISTORY: Family History  Problem Relation Age of Onset   Heart disease Mother    Heart failure Mother    Diabetes Father    Prostate cancer Father    Dementia Father      SOCIAL HISTORY: Social History   Socioeconomic History   Marital status: Married    Spouse name: Not on file   Number of children: Not on file   Years of education: Not on file   Highest education level: Not on file  Occupational History   Not on file  Tobacco Use   Smoking status: Former    Current packs/day: 0.00    Average packs/day: 1.5 packs/day for 20.0 years (30.0 ttl pk-yrs)    Types: Cigarettes    Start date: 03/23/1969    Quit date: 03/23/1989    Years since quitting: 34.2   Smokeless tobacco: Never  Vaping Use   Vaping status: Never Used  Substance and Sexual Activity   Alcohol use: Not Currently    Comment: none in last 2 years   Drug use: No   Sexual activity: Not on file  Other Topics Concern   Not on file  Social History Narrative   Right handed   Caffeine: none   Lives at home with wife   Social Determinants of Health   Financial Resource Strain: Not on file  Food Insecurity: Not on file  Transportation Needs: Not on file  Physical Activity: Not on file  Stress: Not on file   Social Connections: Not on file  Intimate Partner Violence: Not on file     PHYSICAL EXAM  Vitals:   05/30/23 1016  BP: (!) 129/58  Pulse: (!) 54  Weight: 183 lb (83 kg)  Height: 5\' 9"  (1.753 m)    Body mass index is 27.02 kg/m.  Generalized: Well developed, in no acute distress  Cardiology: normal rate and rhythm, no murmur auscultated  Respiratory: clear to auscultation bilaterally    Neurological examination  Mentation: Alert oriented to time, place, history taking. Follows all commands speech and language fluent Cranial nerve II-XII: Pupils were equal round reactive to light. Extraocular movements were full, visual field were full on confrontational test. Facial sensation and strength were normal. Uvula tongue midline. Head turning and shoulder shrug  were normal and symmetric. Motor: The motor testing reveals 5 over 5 strength of all 4 extremities. Good symmetric motor tone is noted throughout. No bradykinesia. Mild right sided cogwheel rigidity. Slight resting tremor of right hand noted.  Sensory: Sensory testing is intact to soft touch on all 4 extremities. No evidence of extinction is noted.  Coordination: Cerebellar testing reveals good finger-nose-finger and heel-to-shin bilaterally.  Gait and station: Able to stand unassisted. Mildly stooped posture. Gait is narrow based with decreased right arm swing.  Reflexes: Deep tendon reflexes are symmetric and normal bilaterally.    DIAGNOSTIC DATA (LABS, IMAGING, TESTING) - I reviewed patient records, labs, notes, testing and imaging myself where available.  Lab Results  Component Value Date   WBC 8.6 08/04/2015   HGB 15.3 08/04/2015   HCT 43.4 08/04/2015   MCV 89.5 08/04/2015   PLT 266 08/04/2015      Component Value Date/Time   NA 140 10/28/2019 1346   K 5.3 (H) 10/28/2019 1346   CL 103 10/28/2019 1346   CO2 26 10/28/2019 1346   GLUCOSE 84 10/28/2019 1346   GLUCOSE 120 (H) 08/06/2015 0500   BUN 14 10/28/2019  1346   CREATININE 1.10 10/28/2019  1346   CALCIUM 10.0 10/28/2019 1346   GFRNONAA 68 10/28/2019 1346   GFRAA 78 10/28/2019 1346   No results found for: "CHOL", "HDL", "LDLCALC", "LDLDIRECT", "TRIG", "CHOLHDL" No results found for: "HGBA1C" No results found for: "VITAMINB12" Lab Results  Component Value Date   TSH 1.230 10/01/2019        No data to display               No data to display           ASSESSMENT AND PLAN  72 y.o. year old male  has a past medical history of Atrial fibrillation (HCC), Atrial flutter (HCC) (09/27/2016), Atypical chest pain (06/30/2018), Cholelithiasis (02/17/2019), Drug-induced erectile dysfunction (12/28/2016), Dyslipidemia (06/11/2017), Dyspnea on exertion (11/20/2019), Dysrhythmia (2001), GERD (gastroesophageal reflux disease), High cholesterol, Hypertension, Lumbar radiculopathy (06/08/2021), Major neurocognitive disorder due to Parkinson's disease, possible (08/24/2019), PAC (premature atrial contraction) (05/19/2020), PAF (paroxysmal atrial fibrillation) (HCC) (12/24/2019), Palpitations (09/27/2016), Parkinson disease (09/19/2020), Patella fracture (08/05/2015), PONV (postoperative nausea and vomiting), PVC (premature ventricular contraction) (11/20/2019), Supraventricular tachycardia (09/27/2016), and Tinnitus (12/28/2019). here with    Parkinson's disease without dyskinesia or fluctuating manifestations - Plan: Ambulatory referral to Neurology  Snoring  At risk for obstructive sleep apnea  Cogwheel rigidity  RIEN PATRICE is doing well, today. Tremor has worsened but remains fairly mild. He will continue ropinirole 3mg  but we will increase dosing to four times daily. I have commended him on his activity and encouraged him to remain active. We have discussed HST, however, he does not feel this is needed at this time. He does snore but denies apneic episodes and no EDS. We will continue to monitor. He requests a referral to [facility] for consideration  of research trials and or non invasive treatment options for management of PD. Healthy lifestyle habits encouraged. He will follow up with PCP as directed. He will return to see me in 6 months, sooner if needed. He verbalizes understanding and agreement with this plan.    Orders Placed This Encounter  Procedures   Ambulatory referral to Neurology    Referral Priority:   Routine    Referral Type:   Consultation    Referral Reason:   Specialty Services Required    Requested Specialty:   Neurology    Number of Visits Requested:   1     Meds ordered this encounter  Medications   rOPINIRole (REQUIP) 3 MG tablet    Sig: Take 1 tablet (3 mg total) by mouth 4 (four) times daily.    Dispense:  360 tablet    Refill:  3    Order Specific Question:   Supervising Provider    Answer:   Anson Fret [[id number]]     Shawnie Dapper, MSN, FNP-C 05/30/2023, 11:47 AM  [facility] [address] [place], [place] [address] [phone]

## 2023-05-29 NOTE — Patient Instructions (Addendum)
Below is our plan:  We will increase ropinirole to 3mg  four times daily. Start by adding a 1/2 tablet at 9am for about a week. If well tolerated, take full tablet four times daily. I will send a referral to get you in with Deretha Emory to discuss additional treatment options and possible research opportunities. Please continue to stay active.   Please make sure you are staying well hydrated. I recommend 50-60 ounces daily. Well balanced diet and regular exercise encouraged. Consistent sleep schedule with 6-8 hours recommended.   Please continue follow up with care team as directed.   Follow up with me in 6 months  You may receive a survey regarding today's visit. I encourage you to leave honest feed back as I do use this information to improve patient care. Thank you for seeing me today!

## 2023-05-30 ENCOUNTER — Telehealth: Payer: Self-pay | Admitting: Family Medicine

## 2023-05-30 ENCOUNTER — Ambulatory Visit: Payer: Medicare Other | Admitting: Cardiology

## 2023-05-30 ENCOUNTER — Ambulatory Visit (INDEPENDENT_AMBULATORY_CARE_PROVIDER_SITE_OTHER): Payer: Medicare Other | Admitting: Family Medicine

## 2023-05-30 ENCOUNTER — Encounter: Payer: Self-pay | Admitting: Family Medicine

## 2023-05-30 VITALS — BP 129/58 | HR 54 | Ht 69.0 in | Wt 183.0 lb

## 2023-05-30 DIAGNOSIS — R29898 Other symptoms and signs involving the musculoskeletal system: Secondary | ICD-10-CM | POA: Diagnosis not present

## 2023-05-30 DIAGNOSIS — R0683 Snoring: Secondary | ICD-10-CM

## 2023-05-30 DIAGNOSIS — Z9189 Other specified personal risk factors, not elsewhere classified: Secondary | ICD-10-CM | POA: Diagnosis not present

## 2023-05-30 DIAGNOSIS — G20A1 Parkinson's disease without dyskinesia, without mention of fluctuations: Secondary | ICD-10-CM

## 2023-05-30 MED ORDER — ROPINIROLE HCL 3 MG PO TABS: 3.0000 mg | ORAL_TABLET | Freq: Four times a day (QID) | ORAL | 3 refills | Status: AC

## 2023-05-30 NOTE — Telephone Encounter (Signed)
Referral faxed to [facility]: Phone [phone]  Fax: [phone]

## 2023-06-05 ENCOUNTER — Ambulatory Visit: Payer: Medicare Other | Attending: Cardiology | Admitting: Cardiology

## 2023-06-05 ENCOUNTER — Encounter: Payer: Self-pay | Admitting: Cardiology

## 2023-06-05 VITALS — BP 110/70 | HR 60 | Ht 70.0 in | Wt 184.2 lb

## 2023-06-05 DIAGNOSIS — R0609 Other forms of dyspnea: Secondary | ICD-10-CM | POA: Diagnosis not present

## 2023-06-05 DIAGNOSIS — I491 Atrial premature depolarization: Secondary | ICD-10-CM | POA: Diagnosis not present

## 2023-06-05 DIAGNOSIS — G20B2 Parkinson's disease with dyskinesia, with fluctuations: Secondary | ICD-10-CM

## 2023-06-05 DIAGNOSIS — I480 Paroxysmal atrial fibrillation: Secondary | ICD-10-CM | POA: Diagnosis not present

## 2023-06-05 NOTE — Patient Instructions (Signed)
Medication Instructions:  Your physician recommends that you continue on your current medications as directed. Please refer to the Current Medication list given to you today.  *If you need a refill on your cardiac medications before your next appointment, please call your pharmacy*   Lab Work: None Ordered If you have labs (blood work) drawn today and your tests are completely normal, you will receive your results only by: MyChart Message (if you have MyChart) OR A paper copy in the mail If you have any lab test that is abnormal or we need to change your treatment, we will call you to review the results.   Testing/Procedures: Your physician has requested that you have an echocardiogram. Echocardiography is a painless test that uses sound waves to create images of your heart. It provides your doctor with information about the size and shape of your heart and how well your heart's chambers and valves are working. This procedure takes approximately one hour. There are no restrictions for this procedure. Please do NOT wear cologne, perfume, aftershave, or lotions (deodorant is allowed). Please arrive 15 minutes prior to your appointment time.      Follow-Up: At [facility], you and your health needs are our priority.  As part of our continuing mission to provide you with exceptional heart care, we have created designated Provider Care Teams.  These Care Teams include your primary Cardiologist (physician) and Advanced Practice Providers (APPs -  Physician Assistants and Nurse Practitioners) who all work together to provide you with the care you need, when you need it.  We recommend signing up for the patient portal called "MyChart".  Sign up information is provided on this After Visit Summary.  MyChart is used to connect with patients for Virtual Visits (Telemedicine).  Patients are able to view lab/test results, encounter notes, upcoming appointments, etc.  Non-urgent messages can be sent to  your provider as well.   To learn more about what you can do with MyChart, go to [email or web address].    Your next appointment:   6 month(s)  The format for your next appointment:   In Person  Provider:   Robert Krasowski, MD    Other Instructions NA  

## 2023-06-05 NOTE — Addendum Note (Signed)
Addended by: Baldo Ash D on: 06/05/2023 02:06 PM   Modules accepted: Orders

## 2023-06-05 NOTE — Progress Notes (Signed)
Cardiology Office Note:    Date:  06/05/2023   ID:  Alexander Bowen, DOB 1950-12-11, MRN [id number]  PCP:  Charlott Rakes, MD  Cardiologist:  Gypsy Balsam, MD    Referring MD: Charlott Rakes, MD   Chief Complaint  Patient presents with   Follow-up    History of Present Illness:    Alexander Bowen is a 72 y.o. male with past medical history significant for PVCs, PACs, paroxysmal atrial fibrillation, parkinsonian, dyslipidemia.  Comes today to months for follow-up.  Overall from cardiac standpoint reviewed doing well.  Denies having the palpitations no chest pain tightness squeezing pressure burning chest but he does complain of having some shortness of breath.  3 times a week he goes to [employer] this is a part of Parkinson's support group he enjoyed this but he noticed that feeling short of breath quite easily no chest pain no tightness no palpitations  Past Medical History:  Diagnosis Date   Atrial fibrillation (HCC)    Atrial flutter (HCC) 09/27/2016   Overview:  Chads score 0, only an aspirin  Formatting of this note might be different from the original. Chads score 0, only an aspirin   Atypical chest pain 06/30/2018   Cholelithiasis 02/17/2019   Drug-induced erectile dysfunction 12/28/2016   Dyslipidemia 06/11/2017   Dyspnea on exertion 11/20/2019   Dysrhythmia 2001   palpitations -checked out by Cardiologist   GERD (gastroesophageal reflux disease)    High cholesterol    Hypertension    Lumbar radiculopathy 06/08/2021   Major neurocognitive disorder due to Parkinson's disease, possible 08/24/2019   PAC (premature atrial contraction) 05/19/2020   PAF (paroxysmal atrial fibrillation) (HCC) 12/24/2019   Palpitations 09/27/2016   Parkinson disease 09/19/2020   Patella fracture 08/05/2015   PONV (postoperative nausea and vomiting)    PVC (premature ventricular contraction) 11/20/2019   Supraventricular tachycardia 09/27/2016   Tinnitus 12/28/2019    Past  Surgical History:  Procedure Laterality Date   APPENDECTOMY     CHOLECYSTECTOMY     LITHOTRIPSY     x 2   ORIF PATELLA Left 08/05/2015   Procedure: OPEN REDUCTION INTERNAL (ORIF) LEFT  FIXATION PATELLA;  Surgeon: Jene Every, MD;  Location: [facility];  Service: Orthopedics;  Laterality: Left;   STONE EXTRACTION WITH BASKET     x2   TONSILLECTOMY      Current Medications: Current Meds  Medication Sig   carvedilol (COREG) 3.125 MG tablet Take 1 tablet (3.125 mg total) by mouth 2 (two) times daily with a meal.   diltiazem (CARDIZEM) 30 MG tablet Take 30 mg by mouth as needed (Afib sx's). Take if heart rate goes over 120   ELIQUIS 5 MG TABS tablet TAKE 1 TABLET(5 MG) BY MOUTH TWICE DAILY (Patient taking differently: Take 5 mg by mouth 2 (two) times daily.)   gabapentin (NEURONTIN) 300 MG capsule Take 1 capsule by mouth as needed (Neuropathy).   losartan (COZAAR) 50 MG tablet TAKE 1 TABLET(50 MG) BY MOUTH DAILY (Patient taking differently: Take 50 mg by mouth daily.)   MAGNESIUM GLYCINATE PO Take 400 mg by mouth 2 (two) times daily.   omeprazole (PRILOSEC) 20 MG capsule Take 1 capsule by mouth as needed (Indigestion).   rOPINIRole (REQUIP) 3 MG tablet Take 1 tablet (3 mg total) by mouth 4 (four) times daily.   simvastatin (ZOCOR) 40 MG tablet Take 40 mg by mouth at bedtime.     Allergies:   Patient has no known allergies.  Social History   Socioeconomic History   Marital status: Married    Spouse name: Not on file   Number of children: Not on file   Years of education: Not on file   Highest education level: Not on file  Occupational History   Not on file  Tobacco Use   Smoking status: Former    Current packs/day: 0.00    Average packs/day: 1.5 packs/day for 20.0 years (30.0 ttl pk-yrs)    Types: Cigarettes    Start date: 03/23/1969    Quit date: 03/23/1989    Years since quitting: 34.2   Smokeless tobacco: Never  Vaping Use   Vaping status: Never Used  Substance and Sexual  Activity   Alcohol use: Not Currently    Comment: none in last 2 years   Drug use: No   Sexual activity: Not on file  Other Topics Concern   Not on file  Social History Narrative   Right handed   Caffeine: none   Lives at home with wife   Social Determinants of Health   Financial Resource Strain: Not on file  Food Insecurity: Not on file  Transportation Needs: Not on file  Physical Activity: Not on file  Stress: Not on file  Social Connections: Not on file     Family History: The patient's family history includes Dementia in his father; Diabetes in his father; Heart disease in his mother; Heart failure in his mother; Prostate cancer in his father. ROS:   Please see the history of present illness.    All 14 point review of systems negative except as described per history of present illness  EKGs/Labs/Other Studies Reviewed:         Recent Labs: No results found for requested labs within last 365 days.  Recent Lipid Panel No results found for: "CHOL", "TRIG", "HDL", "CHOLHDL", "VLDL", "LDLCALC", "LDLDIRECT"  Physical Exam:    VS:  BP 110/70 (BP Location: Left Arm, Patient Position: Sitting)   Pulse 60   Ht 5\' 10"  (1.778 m)   Wt 184 lb 3.2 oz (83.6 kg)   SpO2 96%   BMI 26.43 kg/m     Wt Readings from Last 3 Encounters:  06/05/23 184 lb 3.2 oz (83.6 kg)  05/30/23 183 lb (83 kg)  11/14/22 187 lb 8 oz (85 kg)     GEN:  Well nourished, well developed in no acute distress HEENT: Normal NECK: No JVD; No carotid bruits LYMPHATICS: No lymphadenopathy CARDIAC: RRR, no murmurs, no rubs, no gallops RESPIRATORY:  Clear to auscultation without rales, wheezing or rhonchi  ABDOMEN: Soft, non-tender, non-distended MUSCULOSKELETAL:  No edema; No deformity  SKIN: Warm and dry LOWER EXTREMITIES: no swelling NEUROLOGIC:  Alert and oriented x 3 PSYCHIATRIC:  Normal affect   ASSESSMENT:    1. PAF (paroxysmal atrial fibrillation) (HCC)   2. PAC (premature atrial  contraction)   3. Parkinson's disease with dyskinesia and fluctuating manifestations   4. Dyspnea on exertion    PLAN:    In order of problems listed above:  Paroxysmal atrial fibrillation, he is anticoagulated denies having any palpitation lately. Parkinsons: Follow-up by neurology.  Stable.  Apparently will be referred to [facility]: [facility] Rhonda W for more advanced management. Dyspnea on exertion which make me worry I will schedule him to have echocardiogram actually does not have any cardiomyopathy. Dyslipidemia I did review K PN data from May 13 of this year showing total cholesterol 128 HDL 41.  Will continue present management which includes simvastatin 40.  Medication Adjustments/Labs and Tests Ordered: Current medicines are reviewed at length with the patient today.  Concerns regarding medicines are outlined above.  No orders of the defined types were placed in this encounter.  Medication changes: No orders of the defined types were placed in this encounter.   Signed, Georgeanna Lea, MD, [facility] 06/05/2023 1:57 PM    [facility]

## 2023-06-14 ENCOUNTER — Other Ambulatory Visit: Payer: Self-pay | Admitting: Cardiology

## 2023-06-29 ENCOUNTER — Encounter: Payer: Self-pay | Admitting: Family Medicine

## 2023-07-02 ENCOUNTER — Ambulatory Visit: Payer: Medicare Other | Attending: Cardiology

## 2023-07-02 DIAGNOSIS — R0609 Other forms of dyspnea: Secondary | ICD-10-CM

## 2023-07-02 LAB — ECHOCARDIOGRAM COMPLETE: S' Lateral: 3 cm

## 2023-07-04 ENCOUNTER — Telehealth: Payer: Self-pay

## 2023-07-04 NOTE — Telephone Encounter (Signed)
Patient notified of results.

## 2023-07-04 NOTE — Telephone Encounter (Signed)
-----   Message from Gypsy Balsam sent at 07/04/2023 10:37 AM EDT ----- Echocardiogram showed ejection fraction within normal limits, mild concentric LVH, overall looks good

## 2023-07-16 NOTE — Telephone Encounter (Signed)
Referral for Neurology has been refaxed to [facility]. Phone: [phone], Fax: [phone]

## 2023-07-27 ENCOUNTER — Other Ambulatory Visit: Payer: Self-pay | Admitting: Cardiology

## 2023-09-16 ENCOUNTER — Telehealth: Payer: Self-pay | Admitting: *Deleted

## 2023-09-16 NOTE — Telephone Encounter (Signed)
Left pt voice mail not able to reach pt.

## 2023-09-30 ENCOUNTER — Other Ambulatory Visit: Payer: Self-pay | Admitting: Urology

## 2023-09-30 DIAGNOSIS — R972 Elevated prostate specific antigen [PSA]: Secondary | ICD-10-CM

## 2023-10-01 ENCOUNTER — Telehealth: Payer: Self-pay

## 2023-10-01 NOTE — Telephone Encounter (Signed)
   Name: Alexander Bowen  DOB: 22-Jul-1951  MRN: [id number]  Primary Cardiologist: Gypsy Balsam, MD Last OV 06/05/23 with Dr. Bing Matter  Preoperative team, please contact this patient and set up a phone call appointment for further preoperative risk assessment. Please obtain consent and complete medication review. Thank you for your help.  I confirm that guidance regarding antiplatelet and oral anticoagulation therapy has been completed and, if necessary, noted below.   Per office protocol, patient can hold Eliquis for 3 days prior to procedure.    I also confirmed the patient [place] in the state of [name]. As per [facility] Medical Board telemedicine laws, the patient must reside in the state in which the provider is licensed.  Rip Harbour, NP 10/01/2023, 4:57 PM [facility]

## 2023-10-01 NOTE — Telephone Encounter (Signed)
Pharmacy please advise on holding Eliquis prior to prostate biopsy scheduled for TBD. Thank you.

## 2023-10-01 NOTE — Telephone Encounter (Signed)
   [facility] Pre-operative Risk Assessment    Request for surgical clearance:  What type of surgery is being performed? Prostate Biopsy    When is this surgery scheduled? TBD   What type of clearance is required (medical clearance vs. Pharmacy clearance to hold med vs. Both)? Pharmacy  Are there any medications that need to be held prior to surgery and how long?Eliquis hold 3 days prior   Practice name and name of physician performing surgery? Dr. Rhoderick Moody at [facility]   What is your office phone number: [phone]    7.   What is your office fax number:[phone]  8.   Anesthesia type (None, local, MAC, general) ? Not specified   Alexander Bowen 10/01/2023, 12:07 PM  _________________________________________________________________   (provider comments below)

## 2023-10-01 NOTE — Telephone Encounter (Signed)
Patient with diagnosis of afib on Eliquis for anticoagulation.    Procedure: prostate biopsy Date of procedure: TBD   CHA2DS2-VASc Score = 2   This indicates a 2.2% annual risk of stroke. The patient's score is based upon: CHF History: 0 HTN History: 1 Diabetes History: 0 Stroke History: 0 Vascular Disease History: 0 Age Score: 1 Gender Score: 0      CrCl 78 ml/min Platelet count 207  Per office protocol, patient can hold Eliquis for 3 days prior to procedure.    **This guidance is not considered finalized until pre-operative APP has relayed final recommendations.**

## 2023-10-02 ENCOUNTER — Telehealth: Payer: Self-pay | Admitting: *Deleted

## 2023-10-02 NOTE — Telephone Encounter (Signed)
Pt has been scheduled tele pre op appt 10/16/23. Med rec and consent are done. Pt states surgery won't be scheduled until cleared.     Patient Consent for Virtual Visit        Alexander Bowen has provided verbal consent on 10/02/2023 for a virtual visit (video or telephone).   CONSENT FOR VIRTUAL VISIT FOR:  Alexander Bowen  By participating in this virtual visit I agree to the following:  I hereby voluntarily request, consent and authorize [facility] and its employed or contracted physicians, physician assistants, nurse practitioners or other licensed health care professionals (the Practitioner), to provide me with telemedicine health care services (the "Services") as deemed necessary by the treating Practitioner. I acknowledge and consent to receive the Services by the Practitioner via telemedicine. I understand that the telemedicine visit will involve communicating with the Practitioner through live audiovisual communication technology and the disclosure of certain medical information by electronic transmission. I acknowledge that I have been given the opportunity to request an in-person assessment or other available alternative prior to the telemedicine visit and am voluntarily participating in the telemedicine visit.  I understand that I have the right to withhold or withdraw my consent to the use of telemedicine in the course of my care at any time, without affecting my right to future care or treatment, and that the Practitioner or I may terminate the telemedicine visit at any time. I understand that I have the right to inspect all information obtained and/or recorded in the course of the telemedicine visit and may receive copies of available information for a reasonable fee.  I understand that some of the potential risks of receiving the Services via telemedicine include:  Delay or interruption in medical evaluation due to technological equipment failure or  disruption; Information transmitted may not be sufficient (e.g. poor resolution of images) to allow for appropriate medical decision making by the Practitioner; and/or  In rare instances, security protocols could fail, causing a breach of personal health information.  Furthermore, I acknowledge that it is my responsibility to provide information about my medical history, conditions and care that is complete and accurate to the best of my ability. I acknowledge that Practitioner's advice, recommendations, and/or decision may be based on factors not within their control, such as incomplete or inaccurate data provided by me or distortions of diagnostic images or specimens that may result from electronic transmissions. I understand that the practice of medicine is not an exact science and that Practitioner makes no warranties or guarantees regarding treatment outcomes. I acknowledge that a copy of this consent can be made available to me via my patient portal [facility] MyChart), or I can request a printed copy by calling the office of [facility].    I understand that my insurance will be billed for this visit.   I have read or had this consent read to me. I understand the contents of this consent, which adequately explains the benefits and risks of the Services being provided via telemedicine.  I have been provided ample opportunity to ask questions regarding this consent and the Services and have had my questions answered to my satisfaction. I give my informed consent for the services to be provided through the use of telemedicine in my medical care

## 2023-10-02 NOTE — Telephone Encounter (Signed)
Pt has been scheduled tele pre op appt 10/16/23. Med rec and consent are done. Pt states surgery won't be scheduled until cleared.

## 2023-10-16 ENCOUNTER — Ambulatory Visit: Payer: Medicare Other | Attending: Cardiovascular Disease

## 2023-10-16 DIAGNOSIS — Z01810 Encounter for preprocedural cardiovascular examination: Secondary | ICD-10-CM | POA: Diagnosis not present

## 2023-10-16 NOTE — Progress Notes (Signed)
Virtual Visit via Telephone Note   Because of HORICE BORSETH co-morbid illnesses, he is at least at moderate risk for complications without adequate follow up.  This format is felt to be most appropriate for this patient at this time.  The patient did not have access to video technology/had technical difficulties with video requiring transitioning to audio format only (telephone).  All issues noted in this document were discussed and addressed.  No physical exam could be performed with this format.  Please refer to the patient's chart for his consent to telehealth for [facility].  Evaluation Performed:  Preoperative cardiovascular risk assessment _____________   Date:  10/16/2023   Patient ID:  Alexander Bowen, DOB November 20, 1950, MRN [id number] Patient Location:  Home Provider location:   Office  Primary Care Provider:  Charlott Rakes, MD Primary Cardiologist:  Gypsy Balsam, MD  Chief Complaint / Patient Profile   72 y.o. y/o male with a h/o paroxysmal atrial fibrillation, PACs, Parkinson's who is pending prostate biopsy and presents today for telephonic preoperative cardiovascular risk assessment.  History of Present Illness    Alexander Bowen is a 72 y.o. male who presents via audio/video conferencing for a telehealth visit today.  Pt was last seen in cardiology clinic on 06/05/2023 by Dr. Bing Matter.  At that time Alexander Bowen was doing well .  The patient is now pending procedure as outlined above. Since his last visit, he remains stable from a cardiac standpoint.  Today he denies chest pain, shortness of breath, lower extremity edema, fatigue, palpitations, melena, hematuria, hemoptysis, diaphoresis, weakness, presyncope, syncope, orthopnea, and PND.    Past Medical History    Past Medical History:  Diagnosis Date   Atrial fibrillation (HCC)    Atrial flutter (HCC) 09/27/2016   Overview:  Chads score 0, only an aspirin  Formatting of this note might  be different from the original. Chads score 0, only an aspirin   Atypical chest pain 06/30/2018   Cholelithiasis 02/17/2019   Drug-induced erectile dysfunction 12/28/2016   Dyslipidemia 06/11/2017   Dyspnea on exertion 11/20/2019   Dysrhythmia 2001   palpitations -checked out by Cardiologist   GERD (gastroesophageal reflux disease)    High cholesterol    Hypertension    Lumbar radiculopathy 06/08/2021   Major neurocognitive disorder due to Parkinson's disease, possible 08/24/2019   PAC (premature atrial contraction) 05/19/2020   PAF (paroxysmal atrial fibrillation) (HCC) 12/24/2019   Palpitations 09/27/2016   Parkinson disease 09/19/2020   Patella fracture 08/05/2015   PONV (postoperative nausea and vomiting)    PVC (premature ventricular contraction) 11/20/2019   Supraventricular tachycardia 09/27/2016   Tinnitus 12/28/2019   Past Surgical History:  Procedure Laterality Date   APPENDECTOMY     CHOLECYSTECTOMY     LITHOTRIPSY     x 2   ORIF PATELLA Left 08/05/2015   Procedure: OPEN REDUCTION INTERNAL (ORIF) LEFT  FIXATION PATELLA;  Surgeon: Jene Every, MD;  Location: [facility];  Service: Orthopedics;  Laterality: Left;   STONE EXTRACTION WITH BASKET     x2   TONSILLECTOMY      Allergies  No Known Allergies  Home Medications    Prior to Admission medications   Medication Sig Start Date End Date Taking? Authorizing Provider  carbidopa-levodopa (SINEMET IR) 25-100 MG tablet Take 1 tablet by mouth 3 (three) times daily. HAVE NOT STARTED YET, TRANSITIONING FROM REQUIP Patient not taking: Reported on 10/02/2023 09/30/23 09/29/24  [provider]  carvedilol (COREG) 3.125  MG tablet TAKE 1 TABLET(3.125 MG) BY MOUTH TWICE DAILY WITH A MEAL 07/29/23   Georgeanna Lea, MD  diltiazem (CARDIZEM) 30 MG tablet Take 30 mg by mouth as needed (Afib sx's). Take if heart rate goes over 120 12/02/19   [provider]  ELIQUIS 5 MG TABS tablet TAKE 1 TABLET(5 MG) BY MOUTH TWICE  DAILY Patient taking differently: Take 5 mg by mouth 2 (two) times daily. 02/18/23   Georgeanna Lea, MD  gabapentin (NEURONTIN) 300 MG capsule Take 1 capsule by mouth as needed (Neuropathy).    [provider]  losartan (COZAAR) 50 MG tablet TAKE 1 TABLET(50 MG) BY MOUTH DAILY 06/14/23   Georgeanna Lea, MD  MAGNESIUM GLYCINATE PO Take 400 mg by mouth 2 (two) times daily.    [provider]  omeprazole (PRILOSEC) 20 MG capsule Take 1 capsule by mouth as needed (Indigestion). 11/17/18   [provider]  rOPINIRole (REQUIP) 3 MG tablet Take 1 tablet (3 mg total) by mouth 4 (four) times daily. 05/30/23   Lomax, Amy, NP  simvastatin (ZOCOR) 40 MG tablet Take 40 mg by mouth at bedtime.    [provider]    Physical Exam    Vital Signs:  Alexander Bowen does not have vital signs available for review today.  Given telephonic nature of communication, physical exam is limited. AAOx3. NAD. Normal affect.  Speech and respirations are unlabored.  Accessory Clinical Findings    None  Assessment & Plan    1.  Preoperative Cardiovascular Risk Assessment:Dr. Rhoderick [facility] at [facility] , [phone]       Primary Cardiologist: Gypsy Balsam, MD  Chart reviewed as part of pre-operative protocol coverage. Given past medical history and time since last visit, based on ACC/AHA guidelines, Alexander Bowen would be at acceptable risk for the planned procedure without further cardiovascular testing.   Patient was advised that if he develops new symptoms prior to surgery to contact our office to arrange a follow-up appointment.  He verbalized understanding.  Patient with diagnosis of afib on Eliquis for anticoagulation.     Procedure: prostate biopsy Date of procedure: TBD     CHA2DS2-VASc Score = 2   This indicates a 2.2% annual risk of stroke. The patient's score is based upon: CHF History: 0 HTN History: 1 Diabetes History:  0 Stroke History: 0 Vascular Disease History: 0 Age Score: 1 Gender Score: 0       CrCl 78 ml/min Platelet count 207   Per office protocol, patient can hold Eliquis for 3 days prior to procedure.   I will route this recommendation to the requesting party via Epic fax function and remove from pre-op pool.  Please call with questions.       Time:   Today, I have spent 5 minutes with the patient with telehealth technology discussing medical history, symptoms, and management plan.     Ronney Asters, NP  10/16/2023, 7:15 AM    Prior to patient's phone evaluation I spent greater than 10 minutes reviewing their past medical history and cardiac medications.

## 2023-10-25 ENCOUNTER — Other Ambulatory Visit: Payer: Self-pay | Admitting: Cardiology

## 2023-10-25 DIAGNOSIS — I480 Paroxysmal atrial fibrillation: Secondary | ICD-10-CM

## 2023-10-25 NOTE — Telephone Encounter (Signed)
Prescription refill request for Eliquis received. Indication:afib Last office visit:7/24 Scr:0.99  1/24 Age: 72 Weight:83.6  kg  Prescription refilled

## 2023-11-16 ENCOUNTER — Ambulatory Visit
Admission: RE | Admit: 2023-11-16 | Discharge: 2023-11-16 | Disposition: A | Discharge status: Home / Self Care | Payer: Medicare Other | Source: Ambulatory Visit | Attending: Urology | Admitting: Urology

## 2023-11-16 DIAGNOSIS — R972 Elevated prostate specific antigen [PSA]: Secondary | ICD-10-CM

## 2023-11-16 MED ORDER — GADOPICLENOL 0.5 MMOL/ML IV SOLN
8.0000 mL | Freq: Once | INTRAVENOUS | Status: AC | PRN
  Administered 2023-11-16: 8 mL via INTRAVENOUS

## 2023-11-18 DIAGNOSIS — M545 Low back pain, unspecified: Secondary | ICD-10-CM | POA: Insufficient documentation

## 2023-11-18 HISTORY — DX: Low back pain, unspecified: M54.50

## 2023-11-19 ENCOUNTER — Ambulatory Visit: Payer: Medicare Other | Admitting: Family Medicine

## 2023-12-03 ENCOUNTER — Ambulatory Visit: Payer: Medicare Other | Admitting: Family Medicine

## 2023-12-03 ENCOUNTER — Telehealth: Payer: Self-pay | Admitting: Family Medicine

## 2023-12-03 NOTE — Telephone Encounter (Signed)
Pt cx appt. Will call back to r/s

## 2023-12-18 ENCOUNTER — Ambulatory Visit: Payer: Medicare Other | Admitting: Family Medicine

## 2024-01-03 ENCOUNTER — Telehealth: Payer: Self-pay | Admitting: Radiation Oncology

## 2024-01-03 NOTE — Telephone Encounter (Signed)
Called patient to schedule a consultation w. Dr. Manning. No answer, LVM for a return call.  ?

## 2024-01-06 NOTE — Progress Notes (Addendum)
 GU Location of Tumor / Histology: Prostate Ca  If Prostate Cancer, Gleason Score is (3 + 4) and PSA is (4.1 on 07/01/2023 and 4.2 no date )  Garfield Cornea presented as referral from Dr. Rhoderick Moody [facility]) for elevated PSA.  Biopsies     11/16/2023 Dr. Cristal Deer Liliane Shi MR Prostate with/without Contrast CLINICAL DATA: Elevated PSA level of 4.1 on 09/25/2023   IMPRESSION: 1. Two PI-RADS category 5 lesions primarily involving the peripheral zone. Targeting data sent to [facility]. 2. Mild prostatomegaly and benign prostatic hypertrophy. 3. Substantial arterial atherosclerosis in the pelvis. 4. Left hamstring tendinopathy.   Past/Anticipated interventions by urology, if any:   01/03/2024 Dr. Rhoderick Moody    Past/Anticipated interventions by medical oncology, if any: NA  Weight changes, if any: Steady.  IPSS: 9 SHIM: 7  Bowel/Bladder complaints, if any:  He takes miralax to help with his constipation due to parkinson's.  Denies bladder changes.  Nausea/Vomiting, if any: No  Pain issues, if any: No   SAFETY ISSUES: Prior radiation? No Pacemaker/ICD? No Possible current pregnancy? Male Is the patient on methotrexate? No  Current Complaints / other details:

## 2024-01-15 DIAGNOSIS — C61 Malignant neoplasm of prostate: Secondary | ICD-10-CM | POA: Insufficient documentation

## 2024-01-15 HISTORY — DX: Malignant neoplasm of prostate: C61

## 2024-01-15 NOTE — Progress Notes (Signed)
 Radiation Oncology         [phone] ________________________________  Initial Outpatient Consultation  Name: Alexander Bowen MRN: [id number]  Date: 01/16/2024  DOB: [birth year]  CC:Alexander Rakes, MD  Shelly Rubenstein*   REFERRING PHYSICIAN: Shelly Rubenstein*  DIAGNOSIS: 73 y.o. gentleman with Stage T1c adenocarcinoma of the prostate with Gleason score of 3+4, and PSA of 4.1.    ICD-10-CM   1. Malignant neoplasm of prostate (HCC)  C61       HISTORY OF PRESENT ILLNESS: Alexander Bowen is a 73 y.o. male with a diagnosis of prostate cancer. He was noted to have an elevated PSA of 4.1 by his primary care physician, Dr. Yetta Flock.  Accordingly, he was referred for evaluation in urology by Dr. Liliane Shi on 09/26/23.  He had a prostate MRI on 11/16/23 that showed two PIRADS 5 lesions in the peripheral zone bilaterally. Therefore, the patient proceeded to MRI fusion transrectal ultrasound with 20 biopsies of the prostate on 12/26/23.  The prostate volume measured 55 cc.  Out of 20 core biopsies, 11 were positive.  The maximum Gleason score was 3+4, and this was seen in 2 of 4 samples from MRI ROI #1, 4 of 4 samples from MRI ROI #2 as well as in the right base lateral, left base lateral, left mid, right mid lateral and left apex lateral.  The patient reviewed the biopsy results with his urologist and he has kindly been referred today for discussion of potential radiation treatment options. He is also scheduled to meet with Dr. Marlou Porch 01/21/24 to discuss his surgical options but is adamantly not interested in prostatectomy.   PREVIOUS RADIATION THERAPY: No  PAST MEDICAL HISTORY:  Past Medical History:  Diagnosis Date   Atrial fibrillation (HCC)    Atrial flutter (HCC) 09/27/2016   Overview:  Chads score 0, only an aspirin  Formatting of this note might be different from the original. Chads score 0, only an aspirin   Atypical chest pain 06/30/2018   Cholelithiasis 02/17/2019    Drug-induced erectile dysfunction 12/28/2016   Dyslipidemia 06/11/2017   Dyspnea on exertion 11/20/2019   Dysrhythmia 2001   palpitations -checked out by Cardiologist   GERD (gastroesophageal reflux disease)    High cholesterol    Hypertension    Lumbar radiculopathy 06/08/2021   Major neurocognitive disorder due to Parkinson's disease, possible 08/24/2019   PAC (premature atrial contraction) 05/19/2020   PAF (paroxysmal atrial fibrillation) (HCC) 12/24/2019   Palpitations 09/27/2016   Parkinson disease (HCC) 09/19/2020   Patella fracture 08/05/2015   PONV (postoperative nausea and vomiting)    PVC (premature ventricular contraction) 11/20/2019   Supraventricular tachycardia (HCC) 09/27/2016   Tinnitus 12/28/2019      PAST SURGICAL HISTORY: Past Surgical History:  Procedure Laterality Date   APPENDECTOMY     CHOLECYSTECTOMY     LITHOTRIPSY     x 2   ORIF PATELLA Left 08/05/2015   Procedure: OPEN REDUCTION INTERNAL (ORIF) LEFT  FIXATION PATELLA;  Surgeon: Jene Every, MD;  Location: [facility];  Service: Orthopedics;  Laterality: Left;   STONE EXTRACTION WITH BASKET     x2   TONSILLECTOMY      FAMILY HISTORY:  Family History  Problem Relation Age of Onset   Heart disease Mother    Heart failure Mother    Diabetes Father    Prostate cancer Father    Dementia Father     SOCIAL HISTORY:  Social History   Socioeconomic History  Marital status: Married    Spouse name: Not on file   Number of children: Not on file   Years of education: Not on file   Highest education level: Not on file  Occupational History   Not on file  Tobacco Use   Smoking status: Former    Current packs/day: 0.00    Average packs/day: 1.5 packs/day for 20.0 years (30.0 ttl pk-yrs)    Types: Cigarettes    Start date: 03/23/1969    Quit date: 03/23/1989    Years since quitting: 34.8   Smokeless tobacco: Never  Vaping Use   Vaping status: Never Used  Substance and Sexual Activity   Alcohol use: Not  Currently    Comment: none in last 2 years   Drug use: No   Sexual activity: Not on file  Other Topics Concern   Not on file  Social History Narrative   Right handed   Caffeine: none   Lives at home with wife   Social Drivers of Corporate investment banker Strain: Not on file  Food Insecurity: Not on file  Transportation Needs: Not on file  Physical Activity: Not on file  Stress: Not on file  Social Connections: Not on file  Intimate Partner Violence: Not on file    ALLERGIES: Patient has no known allergies.  MEDICATIONS:  Current Outpatient Medications  Medication Sig Dispense Refill   carbidopa-levodopa (SINEMET IR) 25-100 MG tablet Take 1 tablet by mouth 3 (three) times daily. HAVE NOT STARTED YET, TRANSITIONING FROM REQUIP (Patient not taking: Reported on 10/02/2023)     carvedilol (COREG) 3.125 MG tablet TAKE 1 TABLET(3.125 MG) BY MOUTH TWICE DAILY WITH A MEAL 180 tablet 2   diltiazem (CARDIZEM) 30 MG tablet Take 30 mg by mouth as needed (Afib sx's). Take if heart rate goes over 120     ELIQUIS 5 MG TABS tablet TAKE 1 TABLET(5 MG) BY MOUTH TWICE DAILY 180 tablet 1   gabapentin (NEURONTIN) 300 MG capsule Take 1 capsule by mouth as needed (Neuropathy).     losartan (COZAAR) 50 MG tablet TAKE 1 TABLET(50 MG) BY MOUTH DAILY 90 tablet 2   MAGNESIUM GLYCINATE PO Take 400 mg by mouth 2 (two) times daily.     omeprazole (PRILOSEC) 20 MG capsule Take 1 capsule by mouth as needed (Indigestion).     rOPINIRole (REQUIP) 3 MG tablet Take 1 tablet (3 mg total) by mouth 4 (four) times daily. 360 tablet 3   simvastatin (ZOCOR) 40 MG tablet Take 40 mg by mouth at bedtime.     No current facility-administered medications for this visit.    REVIEW OF SYSTEMS:  On review of systems, the patient reports that he is doing well overall. He denies any chest pain, shortness of breath, cough, fevers, chills, night sweats, unintended weight changes. He denies any bowel disturbances, and denies  abdominal pain, nausea or vomiting. He denies any new musculoskeletal or joint aches or pains. His IPSS was 9, indicating mild-moderate urinary symptoms. His SHIM was 7, indicating he has severe erectile dysfunction. A complete review of systems is obtained and is otherwise negative.    PHYSICAL EXAM:  Wt Readings from Last 3 Encounters:  06/05/23 184 lb 3.2 oz (83.6 kg)  05/30/23 183 lb (83 kg)  11/14/22 187 lb 8 oz (85 kg)   Temp Readings from Last 3 Encounters:  08/06/15 99 F (37.2 C) (Oral)  08/04/15 99.2 F (37.3 C) (Oral)   BP Readings from Last 3  Encounters:  06/05/23 110/70  05/30/23 (!) 129/58  11/14/22 (!) 141/71   Pulse Readings from Last 3 Encounters:  06/05/23 60  05/30/23 (!) 54  11/14/22 (!) 58    /10  In general this is a well appearing Caucasian male in no acute distress. He's alert and oriented x4 and appropriate throughout the examination. Cardiopulmonary assessment is negative for acute distress, and he exhibits normal effort.     KPS = 100  100 - Normal; no complaints; no evidence of disease. 90   - Able to carry on normal activity; minor signs or symptoms of disease. 80   - Normal activity with effort; some signs or symptoms of disease. 7   - Cares for self; unable to carry on normal activity or to do active work. 60   - Requires occasional assistance, but is able to care for most of his personal needs. 50   - Requires considerable assistance and frequent medical care. 40   - Disabled; requires special care and assistance. 30   - Severely disabled; hospital admission is indicated although death not imminent. 20   - Very sick; hospital admission necessary; active supportive treatment necessary. 10   - Moribund; fatal processes progressing rapidly. 0     - Dead  Karnofsky DA, Abelmann WH, Craver LS and Burchenal [facility] [phone]) The use of the nitrogen mustards in the palliative treatment of carcinoma: with particular reference to bronchogenic carcinoma  Cancer 1 [phone]  LABORATORY DATA:  Lab Results  Component Value Date   WBC 8.6 08/04/2015   HGB 15.3 08/04/2015   HCT 43.4 08/04/2015   MCV 89.5 08/04/2015   PLT 266 08/04/2015   Lab Results  Component Value Date   NA 140 10/28/2019   K 5.3 (H) 10/28/2019   CL 103 10/28/2019   CO2 26 10/28/2019   No results found for: "ALT", "AST", "GGT", "ALKPHOS", "BILITOT"   RADIOGRAPHY: No results found.    IMPRESSION/PLAN: 1. 73 y.o. gentleman with Stage T1c adenocarcinoma of the prostate with Gleason Score of 3+4, and PSA of 4.1. We discussed the patient's workup and outlined the nature of prostate cancer in this setting. The patient's T stage, Gleason's score, and PSA put him into the favorable intermediate risk group. Accordingly, he is eligible for a variety of potential treatment options including brachytherapy, 5.5 weeks of external radiation, or prostatectomy. We discussed the available radiation techniques, and focused on the details and logistics of delivery. We discussed and outlined the risks, benefits, short and long-term effects associated with radiotherapy and compared and contrasted these with prostatectomy. We discussed the role of SpaceOAR gel in reducing the rectal toxicity associated with radiotherapy. He appears to have a good understanding of his disease and our treatment recommendations which are of curative intent.  He was encouraged to ask questions that were answered to his stated satisfaction.  At the conclusion of our conversation, the patient is interested in moving forward with brachytherapy and use of SpaceOAR gel to reduce rectal toxicity from radiotherapy and would like to cancel the consult visit scheduled with Dr. Marlou Porch for 01/21/24.  We will share our discussion with Dr. Liliane Shi and move forward with scheduling his [facility] planning appointment in the near future.  [place] week of [place] 2025 so we will coordinate for the  treatment to occur after his return. The patient met briefly with Darryl Nestle in our office who will be working closely  with him to coordinate OR scheduling and pre and post procedure appointments.  We will contact the pharmaceutical rep to ensure that SpaceOAR is available at the time of procedure.  We enjoyed meeting him and his wife today and look forward to continuing to participate in his care.  We personally spent 70 minutes in this encounter including chart review, reviewing radiological studies, meeting face-to-face with the patient, entering orders and completing documentation.    Marguarite Arbour, PA-C    Margaretmary Dys, MD  [facility]  Radiation Oncology Direct Dial: [phone]  Fax: [phone] [email or web address]  Skype  LinkedIn

## 2024-01-16 ENCOUNTER — Ambulatory Visit
Admission: RE | Admit: 2024-01-16 | Discharge: 2024-01-16 | Disposition: A | Discharge status: Home / Self Care | Payer: Medicare Other | Source: Ambulatory Visit | Attending: Radiation Oncology | Admitting: Radiation Oncology

## 2024-01-16 ENCOUNTER — Encounter: Payer: Self-pay | Admitting: Radiation Oncology

## 2024-01-16 VITALS — BP 127/74 | HR 52 | Temp 96.8°F | Resp 18 | Ht 70.0 in | Wt 183.4 lb

## 2024-01-16 DIAGNOSIS — G20A1 Parkinson's disease without dyskinesia, without mention of fluctuations: Secondary | ICD-10-CM | POA: Insufficient documentation

## 2024-01-16 DIAGNOSIS — Z7901 Long term (current) use of anticoagulants: Secondary | ICD-10-CM | POA: Diagnosis not present

## 2024-01-16 DIAGNOSIS — Z8042 Family history of malignant neoplasm of prostate: Secondary | ICD-10-CM | POA: Diagnosis not present

## 2024-01-16 DIAGNOSIS — K219 Gastro-esophageal reflux disease without esophagitis: Secondary | ICD-10-CM | POA: Diagnosis not present

## 2024-01-16 DIAGNOSIS — E7800 Pure hypercholesterolemia, unspecified: Secondary | ICD-10-CM | POA: Diagnosis not present

## 2024-01-16 DIAGNOSIS — C61 Malignant neoplasm of prostate: Secondary | ICD-10-CM

## 2024-01-16 DIAGNOSIS — Z87891 Personal history of nicotine dependence: Secondary | ICD-10-CM | POA: Insufficient documentation

## 2024-01-16 DIAGNOSIS — I10 Essential (primary) hypertension: Secondary | ICD-10-CM | POA: Insufficient documentation

## 2024-01-16 DIAGNOSIS — I471 Supraventricular tachycardia, unspecified: Secondary | ICD-10-CM | POA: Diagnosis not present

## 2024-01-16 DIAGNOSIS — Z79899 Other long term (current) drug therapy: Secondary | ICD-10-CM | POA: Insufficient documentation

## 2024-01-16 DIAGNOSIS — E785 Hyperlipidemia, unspecified: Secondary | ICD-10-CM | POA: Diagnosis not present

## 2024-01-16 DIAGNOSIS — E079 Disorder of thyroid, unspecified: Secondary | ICD-10-CM | POA: Insufficient documentation

## 2024-01-16 DIAGNOSIS — I4891 Unspecified atrial fibrillation: Secondary | ICD-10-CM | POA: Diagnosis not present

## 2024-01-16 DIAGNOSIS — K8020 Calculus of gallbladder without cholecystitis without obstruction: Secondary | ICD-10-CM | POA: Insufficient documentation

## 2024-01-16 NOTE — Progress Notes (Signed)
 Introduced myself to the patient as the prostate nurse navigator.  No barriers to care identified at this time.  He is here to discuss his radiation treatment options.  I gave him my business card and asked him to call me with questions or concerns.  Verbalized understanding.  ?

## 2024-01-21 ENCOUNTER — Telehealth: Payer: Self-pay | Admitting: *Deleted

## 2024-01-21 NOTE — Telephone Encounter (Signed)
CALLED PATIENT TO UPDATE, SPOKE WITH PATIENT 

## 2024-01-24 ENCOUNTER — Telehealth: Payer: Self-pay | Admitting: *Deleted

## 2024-01-24 ENCOUNTER — Telehealth: Payer: Self-pay | Admitting: Cardiology

## 2024-01-24 ENCOUNTER — Other Ambulatory Visit: Payer: Self-pay | Admitting: Urology

## 2024-01-24 NOTE — Telephone Encounter (Signed)
 Called patient to inform of pre-seed appts. and implant for 04-14-24, spoke with patient and he is aware of these appts.

## 2024-01-24 NOTE — Telephone Encounter (Signed)
   Name: Alexander Bowen  DOB: Sep 23, 1951  MRN: [id number]  Primary Cardiologist: Gypsy Balsam, MD  Chart reviewed as part of pre-operative protocol coverage. The patient has an upcoming visit scheduled with Dr. Bing Matter on 02/07/2024 at which time clearance can be addressed in case there are any issues that would impact surgical recommendations.  Bracy therapy is not scheduled until 04/14/2024 as below. I added preop FYI to appointment note so that provider is aware to address at time of outpatient visit.  Per office protocol the cardiology provider should forward their finalized clearance decision and recommendations regarding antiplatelet therapy to the requesting party below.    This message will also be routed to pharmacy pool for input on holding Eliquis as requested below so that this information is available to the clearing provider at time of patient's appointment.   I will route this message as FYI to requesting party and remove this message from the preop box as separate preop APP input not needed at this time.   Please call with any questions.  Joylene Grapes, NP  01/24/2024, 2:56 PM

## 2024-01-24 NOTE — Telephone Encounter (Signed)
   Pre-operative Risk Assessment    Patient Name: Alexander Bowen  DOB: July 29, 1951 MRN: [id number]   Date of last office visit: 06/05/2023 Date of next office visit: 02/07/24   Request for Surgical Clearance    Procedure:   Brachy therapy   Date of Surgery:  Clearance 04/14/24                                Surgeon:  Dr. Rhoderick Moody Surgeon's Group or Practice Name:  [facility] Phone number:  [phone] Fax number:  [phone]   Type of Clearance Requested:   - Medical  - Pharmacy:  Hold Apixaban (Eliquis) 2 prior   Type of Anesthesia:  General    Additional requests/questions:      Melissa Noon   01/24/2024, 2:49 PM

## 2024-02-07 ENCOUNTER — Encounter: Payer: Self-pay | Admitting: Cardiology

## 2024-02-07 ENCOUNTER — Ambulatory Visit: Payer: Medicare Other | Attending: Cardiology | Admitting: Cardiology

## 2024-02-07 VITALS — BP 110/72 | HR 58 | Ht 70.0 in | Wt 181.8 lb

## 2024-02-07 DIAGNOSIS — I480 Paroxysmal atrial fibrillation: Secondary | ICD-10-CM

## 2024-02-07 DIAGNOSIS — G20B2 Parkinson's disease with dyskinesia, with fluctuations: Secondary | ICD-10-CM | POA: Diagnosis not present

## 2024-02-07 DIAGNOSIS — I10 Essential (primary) hypertension: Secondary | ICD-10-CM

## 2024-02-07 DIAGNOSIS — M79606 Pain in leg, unspecified: Secondary | ICD-10-CM

## 2024-02-07 DIAGNOSIS — C61 Malignant neoplasm of prostate: Secondary | ICD-10-CM

## 2024-02-07 DIAGNOSIS — R0609 Other forms of dyspnea: Secondary | ICD-10-CM

## 2024-02-07 NOTE — Patient Instructions (Signed)
 Medication Instructions:  Your physician recommends that you continue on your current medications as directed. Please refer to the Current Medication list given to you today.  *If you need a refill on your cardiac medications before your next appointment, please call your pharmacy*   Lab Work: None Ordered If you have labs (blood work) drawn today and your tests are completely normal, you will receive your results only by: MyChart Message (if you have MyChart) OR A paper copy in the mail If you have any lab test that is abnormal or we need to change your treatment, we will call you to review the results.   Testing/Procedures:  Vascular screening- no preparation   Your physician has requested that you have a lexiscan myoview. For further information please visit [email or web address] Please follow instruction sheet, as given.  The test will take approximately 3 to 4 hours to complete; you may bring reading material.  If someone comes with you to your appointment, they will need to remain in the main lobby due to limited space in the testing area.    How to prepare for your Myocardial Perfusion Test: Do not eat or drink 3 hours prior to your test, except you may have water. Do not consume products containing caffeine (regular or decaffeinated) 12 hours prior to your test. (ex: coffee, chocolate, sodas, tea). Do bring a list of your current medications with you.  If not listed below, you may take your medications as normal. Do wear comfortable clothes (no dresses or overalls) and walking shoes, tennis shoes preferred (No heels or open toe shoes are allowed). Do NOT wear cologne, perfume, aftershave, or lotions (deodorant is allowed). If these instructions are not followed, your test will have to be rescheduled.      Follow-Up: At [facility], you and your health needs are our priority.  As part of our continuing mission to provide you with exceptional heart care, we have created  designated Provider Care Teams.  These Care Teams include your primary Cardiologist (physician) and Advanced Practice Providers (APPs -  Physician Assistants and Nurse Practitioners) who all work together to provide you with the care you need, when you need it.  We recommend signing up for the patient portal called "MyChart".  Sign up information is provided on this After Visit Summary.  MyChart is used to connect with patients for Virtual Visits (Telemedicine).  Patients are able to view lab/test results, encounter notes, upcoming appointments, etc.  Non-urgent messages can be sent to your provider as well.   To learn more about what you can do with MyChart, go to [email or web address].    Your next appointment:   6 month(s)  The format for your next appointment:   In Person  Provider:   Gypsy Balsam, MD    Other Instructions NA

## 2024-02-07 NOTE — Progress Notes (Signed)
 Cardiology Office Note:    Date:  02/07/2024   ID:  AIZEN DUVAL, DOB 04/09/1951, MRN [id number]  PCP:  Charlott Rakes, MD  Cardiologist:  Gypsy Balsam, MD    Referring MD: Charlott Rakes, MD   Chief Complaint  Patient presents with   Follow-up    History of Present Illness:    Alexander Bowen is a 73 y.o. male past medical history significant for PVCs, PACs, paroxysmal atrial fibrillation, parkinsonian, dyslipidemia comes today to months for follow-up, overall doing well, denies have any chest pain tightness squeezing pressure mid chest he does have progressing Parkinson he still goes to boxing classes but he said he is a little slower he did have MRI of his pelvis done for evaluation of his prostate cancer he was found to have significant sclerosis of the distal aorta and he is very concerned about it denies have any typical symptoms of coronary disease meaning there is no chest pain tightness squeezing pressure burning chest but calcification and atherosclerosis of distorted obviously is concerning  Past Medical History:  Diagnosis Date   Atrial fibrillation (HCC)    Atrial flutter (HCC) 09/27/2016   Overview:  Chads score 0, only an aspirin  Formatting of this note might be different from the original. Chads score 0, only an aspirin   Atypical chest pain 06/30/2018   Cholelithiasis 02/17/2019   Drug-induced erectile dysfunction 12/28/2016   Dyslipidemia 06/11/2017   Dyspnea on exertion 11/20/2019   Dysrhythmia 2001   palpitations -checked out by Cardiologist   GERD (gastroesophageal reflux disease)    High cholesterol    Hypertension    Lumbar radiculopathy 06/08/2021   Major neurocognitive disorder due to Parkinson's disease, possible 08/24/2019   PAC (premature atrial contraction) 05/19/2020   PAF (paroxysmal atrial fibrillation) (HCC) 12/24/2019   Palpitations 09/27/2016   Parkinson disease (HCC) 09/19/2020   Patella fracture 08/05/2015   PONV (postoperative nausea  and vomiting)    PVC (premature ventricular contraction) 11/20/2019   Supraventricular tachycardia (HCC) 09/27/2016   Tinnitus 12/28/2019    Past Surgical History:  Procedure Laterality Date   APPENDECTOMY     CHOLECYSTECTOMY     LITHOTRIPSY     x 2   ORIF PATELLA Left 08/05/2015   Procedure: OPEN REDUCTION INTERNAL (ORIF) LEFT  FIXATION PATELLA;  Surgeon: Jene Every, MD;  Location: [facility];  Service: Orthopedics;  Laterality: Left;   STONE EXTRACTION WITH BASKET     x2   TONSILLECTOMY      Current Medications: Current Meds  Medication Sig   carbidopa-levodopa (SINEMET IR) 25-100 MG tablet Take 1 tablet by mouth 3 (three) times daily. HAVE NOT STARTED YET, TRANSITIONING FROM REQUIP   carvedilol (COREG) 3.125 MG tablet TAKE 1 TABLET(3.125 MG) BY MOUTH TWICE DAILY WITH A MEAL (Patient taking differently: Take 3.125 mg by mouth 2 (two) times daily with a meal.)   diltiazem (CARDIZEM) 30 MG tablet Take 30 mg by mouth as needed (Afib sx's). Take if heart rate goes over 120   ELIQUIS 5 MG TABS tablet TAKE 1 TABLET(5 MG) BY MOUTH TWICE DAILY (Patient taking differently: Take 5 mg by mouth 2 (two) times daily.)   gabapentin (NEURONTIN) 300 MG capsule Take 1 capsule by mouth as needed (Neuropathy).   ibuprofen (ADVIL) 200 MG tablet Take 200 mg by mouth every 6 (six) hours as needed for mild pain (pain score 1-3).   losartan (COZAAR) 50 MG tablet TAKE 1 TABLET(50 MG) BY MOUTH DAILY (Patient taking differently:  Take 50 mg by mouth daily.)   MAGNESIUM GLYCINATE PO Take 400 mg by mouth 2 (two) times daily.   omeprazole (PRILOSEC) 20 MG capsule Take 1 capsule by mouth as needed (Indigestion).   rOPINIRole (REQUIP) 3 MG tablet Take 1 tablet (3 mg total) by mouth 4 (four) times daily. (Patient taking differently: Take 3 mg by mouth at bedtime.)   simvastatin (ZOCOR) 40 MG tablet Take 40 mg by mouth at bedtime.     Allergies:   Patient has no known allergies.   Social History   Socioeconomic  History   Marital status: Married    Spouse name: Not on file   Number of children: Not on file   Years of education: Not on file   Highest education level: Not on file  Occupational History   Not on file  Tobacco Use   Smoking status: Former    Current packs/day: 0.00    Average packs/day: 1.5 packs/day for 20.0 years (30.0 ttl pk-yrs)    Types: Cigarettes    Start date: 03/23/1969    Quit date: 03/23/1989    Years since quitting: 34.9   Smokeless tobacco: Never  Vaping Use   Vaping status: Never Used  Substance and Sexual Activity   Alcohol use: Not Currently    Comment: none in last 2 years   Drug use: No   Sexual activity: Not on file  Other Topics Concern   Not on file  Social History Narrative   Right handed   Caffeine: none   Lives at home with wife   Social Drivers of Corporate investment banker Strain: Not on file  Food Insecurity: No Food Insecurity (01/16/2024)   Hunger Vital Sign    Worried About Running Out of Food in the Last Year: Never true    Ran Out of Food in the Last Year: Never true  Transportation Needs: No Transportation Needs (01/16/2024)   PRAPARE - Administrator, Civil Service (Medical): No    Lack of Transportation (Non-Medical): No  Physical Activity: Not on file  Stress: Not on file  Social Connections: Not on file     Family History: The patient's family history includes Dementia in his father; Diabetes in his father; Heart disease in his mother; Heart failure in his mother; Prostate cancer in his father. ROS:   Please see the history of present illness.    All 14 point review of systems negative except as described per history of present illness  EKGs/Labs/Other Studies Reviewed:    EKG Interpretation Date/Time:  Friday February 07 2024 16:01:32 EDT Ventricular Rate:  58 PR Interval:  172 QRS Duration:  80 QT Interval:  418 QTC Calculation: 410 R Axis:   114  Text Interpretation: Sinus bradycardia with Premature atrial  complexes Left posterior fascicular block Abnormal ECG When compared with ECG of 04-Aug-2015 11:29, Premature atrial complexes are now Present Confirmed by Gypsy Balsam [phone]) on 02/07/2024 4:16:19 PM    Recent Labs: No results found for requested labs within last 365 days.  Recent Lipid Panel No results found for: "CHOL", "TRIG", "HDL", "CHOLHDL", "VLDL", "LDLCALC", "LDLDIRECT"  Physical Exam:    VS:  BP 110/72 (BP Location: Left Arm, Patient Position: Sitting)   Pulse (!) 58   Ht 5\' 10"  (1.778 m)   Wt 181 lb 12.8 oz (82.5 kg)   SpO2 93%   BMI 26.09 kg/m     Wt Readings from Last 3 Encounters:  02/07/24 181 lb  12.8 oz (82.5 kg)  01/16/24 183 lb 6 oz (83.2 kg)  06/05/23 184 lb 3.2 oz (83.6 kg)     GEN:  Well nourished, well developed in no acute distress HEENT: Normal NECK: No JVD; No carotid bruits LYMPHATICS: No lymphadenopathy CARDIAC: RRR, no murmurs, no rubs, no gallops RESPIRATORY:  Clear to auscultation without rales, wheezing or rhonchi  ABDOMEN: Soft, non-tender, non-distended MUSCULOSKELETAL:  No edema; No deformity  SKIN: Warm and dry LOWER EXTREMITIES: no swelling NEUROLOGIC:  Alert and oriented x 3 PSYCHIATRIC:  Normal affect   ASSESSMENT:    1. PAF (paroxysmal atrial fibrillation) (HCC)   2. Malignant neoplasm of prostate (HCC)   3. Parkinson's disease with dyskinesia and fluctuating manifestations (HCC)   4. Primary hypertension    PLAN:    In order of problems listed above:  Paroxysmal atrial fibrillation maintained sinus rhythm, anticoagulated which I will continue.  Denies have any recent palpitations. Atherosclerosis of the distal I wanted.  Quite advanced.  I had a long discussion with him we decided to do vascular screening to make sure there is no vascular problem in his neck also he described to have pain in his leg when he walks and after walking will attributed this to some arthritic symptoms but now with atherosclerosis concerns about  potential circulation so vascular study will be needed.  I also will schedule him to have Lexiscan as he is his ability to exercise somewhat limited I will make sure he does not have any coronary artery disease. Dyslipidemia I did review K PN which show me his LDL 70 HDL 44 this is from October 01, 2023 we will continue present management   Medication Adjustments/Labs and Tests Ordered: Current medicines are reviewed at length with the patient today.  Concerns regarding medicines are outlined above.  Orders Placed This Encounter  Procedures   EKG 12-Lead   Medication changes: No orders of the defined types were placed in this encounter.   Signed, Georgeanna Lea, MD, [facility] 02/07/2024 4:30 PM    [facility]

## 2024-02-10 ENCOUNTER — Encounter (HOSPITAL_COMMUNITY): Payer: Self-pay

## 2024-02-13 ENCOUNTER — Ambulatory Visit: Attending: Cardiology

## 2024-02-13 DIAGNOSIS — R0609 Other forms of dyspnea: Secondary | ICD-10-CM | POA: Diagnosis not present

## 2024-02-13 LAB — MYOCARDIAL PERFUSION IMAGING
LV dias vol: 107 mL (ref 62–150)
LV sys vol: 48 mL
Nuc Stress EF: 55 %
Peak HR: 63 {beats}/min
Rest HR: 46 {beats}/min
Rest Nuclear Isotope Dose: 10.9 mCi
SDS: 2
SRS: 3
SSS: 5
ST Depression (mm): 0 mm
Stress Nuclear Isotope Dose: 28.9 mCi
TID: 1.03

## 2024-02-13 MED ORDER — REGADENOSON 0.4 MG/5ML IV SOLN
0.4000 mg | Freq: Once | INTRAVENOUS | Status: AC
  Administered 2024-02-13: 0.4 mg via INTRAVENOUS

## 2024-02-13 MED ORDER — TECHNETIUM TC 99M TETROFOSMIN IV KIT
28.9000 | PACK | Freq: Once | INTRAVENOUS | Status: AC | PRN
  Administered 2024-02-13: 28.9 via INTRAVENOUS

## 2024-02-13 MED ORDER — TECHNETIUM TC 99M TETROFOSMIN IV KIT
10.9000 | PACK | Freq: Once | INTRAVENOUS | Status: AC | PRN
  Administered 2024-02-13: 10.9 via INTRAVENOUS

## 2024-02-17 ENCOUNTER — Telehealth: Payer: Self-pay

## 2024-02-17 NOTE — Telephone Encounter (Signed)
 Left message on My Chart with normal stress test results per Dr. Vanetta Shawl note. Routed to PCP.

## 2024-02-18 ENCOUNTER — Telehealth: Payer: Self-pay

## 2024-02-18 NOTE — Telephone Encounter (Signed)
 Pt viewed stress test results on My Chart per Dr. Vanetta Shawl note. Routed to PCP.

## 2024-02-25 NOTE — Telephone Encounter (Signed)
 Dr. Farris Hong office calling for an update. Please advise

## 2024-02-26 NOTE — Telephone Encounter (Signed)
 I will forward to preop APP to review if the pt has been cleared.

## 2024-02-27 ENCOUNTER — Ambulatory Visit

## 2024-03-03 NOTE — Telephone Encounter (Signed)
   Patient Name: Alexander Bowen  DOB: Jun 04, 1951 MRN: [id number]  Primary Cardiologist: Ralene Burger, MD  Chart reviewed as part of pre-operative protocol coverage.  Patient was last seen in the office on 02/07/2024 by Dr. Gordan Latina.  Per Dr. Gordan Latina, "Is good to proceed with brachytherapy, it is fine to hold anticoagulation for 2 days before procedure and restart as quickly as feasible from surgical point of view."  Therefore, given past medical history and time since last visit, based on ACC/AHA guidelines, LINKON SIVERSON is at acceptable risk for the planned procedure without further cardiovascular testing.   I will route this recommendation to the requesting party via Epic fax function and remove from pre-op pool.  Please call with questions.  Jude Norton, NP 03/03/2024, 3:36 PM

## 2024-03-11 ENCOUNTER — Other Ambulatory Visit: Payer: Self-pay | Admitting: Cardiology

## 2024-03-11 ENCOUNTER — Ambulatory Visit: Attending: Cardiology

## 2024-03-11 DIAGNOSIS — M79606 Pain in leg, unspecified: Secondary | ICD-10-CM

## 2024-03-17 ENCOUNTER — Telehealth: Payer: Self-pay | Admitting: Cardiology

## 2024-03-17 ENCOUNTER — Telehealth: Payer: Self-pay | Admitting: *Deleted

## 2024-03-17 NOTE — Progress Notes (Signed)
 Pre-seed nursing interview for a diagnosis of Stage T1c adenocarcinoma of the prostate with Gleason score of 3+4, and PSA of 4.1.  Patient identity verified x2.   Patient states issues as follows...  -Pain: Denies -Abdomen: Denies -Urinary: Denies -Bowels: Moderate constipation -Lower extremity issues: Denies  Patient denies all other related issues at this time.  Meaningful use complete.  Urinary Management medication(s)- Denies Urology appointment date- 03/2024, with Dr. Sherrine Dolly at [facility]  No vitals needed for this visit. Ht 5\' 10"  (1.778 m)   Wt 182 lb (82.6 kg)   BMI 26.11 kg/m   This concludes the interaction.  Avery Bodo, LPN

## 2024-03-17 NOTE — Telephone Encounter (Signed)
 Spoke with pt, per Dr. Krasowski if pt is taking Eliquis  and is still having issues tomorrow he should come for an EKG tomorrow. Pt will call tomorrow if he is still having symptoms to schedule a nurse visit for an EKG. Pt verbalized understanding and had no further questions.

## 2024-03-17 NOTE — Telephone Encounter (Signed)
 CALLED PATIENT TO REMIND OF PRE-SEED APPTS. FOR 03-19-24, SPOKE WITH PATIENT AND HE IS AWARE OF THESE APPTS.

## 2024-03-17 NOTE — Telephone Encounter (Signed)
 Patient c/o Palpitations:  STAT if patient reporting lightheadedness, shortness of breath, or chest pain  How long have you had palpitations/irregular HR/ Afib? Are you having the symptoms now? Pt states he feels like he has been in afib for about a day now   Are you currently experiencing lightheadedness, SOB or CP? No   Do you have a history of afib (atrial fibrillation) or irregular heart rhythm? Yes   Have you checked your BP or HR? (document readings if available):  BP 110/65 earlier this morning a few hrs ago   Are you experiencing any other symptoms? No

## 2024-03-18 NOTE — Progress Notes (Signed)
  Radiation Oncology         [phone] ________________________________  Name: CUTTER CRUSAN MRN: [id number]  Date: 03/19/2024  DOB: 1951/02/14  SIMULATION AND TREATMENT PLANNING NOTE PUBIC ARCH STUDY  [id number], Arnetta Lank, MD  [name]*  DIAGNOSIS: 73 y.o. gentleman with Stage T1c adenocarcinoma of the prostate with Gleason score of 3+4, and PSA of 4.1.   Oncology History  Malignant neoplasm of prostate (HCC)  12/26/2023 Cancer Staging   Staging form: Prostate, AJCC 8th Edition - Clinical stage from 12/26/2023: Stage IIB (cT1c, cN0, cM0, PSA: 4.1, Grade Group: 2) - Signed by Keitha Pata, PA-C on 01/15/2024 Histopathologic type: Adenocarcinoma, NOS Stage prefix: Initial diagnosis Prostate specific antigen (PSA) range: Less than 10 Gleason primary pattern: 3 Gleason secondary pattern: 4 Gleason score: 7 Histologic grading system: 5 grade system Number of biopsy cores examined: 20 Number of biopsy cores positive: 11 Location of positive needle core biopsies: Both sides   01/15/2024 Initial Diagnosis   Malignant neoplasm of prostate (HCC)       ICD-10-CM   1. Malignant neoplasm of prostate (HCC)  C61       COMPLEX SIMULATION:  The patient presented today for evaluation for possible prostate seed implant. He was brought to the radiation planning suite and placed supine on the CT couch. A 3-dimensional image study set was obtained in upload to the planning computer. There, on each axial slice, I contoured the prostate gland. Then, using three-dimensional radiation planning tools I reconstructed the prostate in view of the structures from the transperineal needle pathway to assess for possible pubic arch interference. In doing so, I did not appreciate any pubic arch interference. Also, the patient's prostate volume was estimated based on the drawn structure. The volume was 55 cc.  Given the pubic arch appearance and prostate volume, patient remains a good candidate  to proceed with prostate seed implant. Today, he freely provided informed written consent to proceed.    PLAN: The patient will undergo prostate seed implant.   ________________________________  Trilby Fujisawa. Lorri Rota, M.D.

## 2024-03-19 ENCOUNTER — Ambulatory Visit
Admission: RE | Admit: 2024-03-19 | Discharge: 2024-03-19 | Disposition: A | Discharge status: Home / Self Care | Source: Ambulatory Visit | Attending: Radiation Oncology | Admitting: Radiation Oncology

## 2024-03-19 ENCOUNTER — Telehealth: Payer: Self-pay

## 2024-03-19 ENCOUNTER — Encounter: Payer: Self-pay | Admitting: Urology

## 2024-03-19 ENCOUNTER — Ambulatory Visit
Admission: RE | Admit: 2024-03-19 | Discharge: 2024-03-19 | Disposition: A | Discharge status: Home / Self Care | Payer: Self-pay | Source: Ambulatory Visit | Attending: Urology | Admitting: Urology

## 2024-03-19 VITALS — Ht 70.0 in | Wt 182.0 lb

## 2024-03-19 DIAGNOSIS — C61 Malignant neoplasm of prostate: Secondary | ICD-10-CM | POA: Diagnosis present

## 2024-03-19 DIAGNOSIS — M79606 Pain in leg, unspecified: Secondary | ICD-10-CM

## 2024-03-19 NOTE — Telephone Encounter (Signed)
 Arterial Duplex of lower extremities & ABI ordered per Dr. Tonja Fray note.

## 2024-03-19 NOTE — Telephone Encounter (Signed)
 VascuScreen Results reviewed with pt as per Dr. Tonja Fray note.  Pt verbalized understanding and had no additional questions. Arterial Duplex and ABI's ordered.  Routed to PCP

## 2024-03-19 NOTE — Progress Notes (Signed)
 Radiation Oncology         [phone] ________________________________  Outpatient Follow up- Pre-seed visit  Name: Alexander Bowen MRN: [id number]  Date: 03/19/2024  DOB: 1951/09/29  CC:Alexander Levine, MD  Alexander Bowen*   REFERRING PHYSICIAN: Dorcas Bowen*  DIAGNOSIS: 73 y.o. gentleman with Stage T1c adenocarcinoma of the prostate with Gleason score of 3+4, and PSA of 4.1.     ICD-10-CM   1. Malignant neoplasm of prostate (HCC)  C61       HISTORY OF PRESENT ILLNESS: Alexander Bowen is a 73 y.o. male with a diagnosis of prostate cancer. He was noted to have an elevated PSA of 4.1 by his primary care physician, Dr. Arlena Bowen.  Accordingly, he was referred for evaluation in urology by Dr. Sherrine Bowen on 09/26/23.  He had a prostate MRI on 11/16/23 that showed two PIRADS 5 lesions in the peripheral zone bilaterally. Therefore, the patient proceeded to MRI fusion transrectal ultrasound with 20 biopsies of the prostate on 12/26/23.  The prostate volume measured 55 cc.  Out of 20 core biopsies, 11 were positive.  The maximum Gleason score was 3+4, and this was seen in 2 of 4 samples from MRI ROI #1, 4 of 4 samples from MRI ROI #2 as well as in the right base lateral, left base lateral, left mid, right mid lateral and left apex lateral.   The patient reviewed the biopsy results with his urologist and was kindly referred to us  for discussion of potential radiation treatment options. We initially met the patient on 01/16/24 and he was most interested in proceeding with brachytherapy and SpaceOAR gel placement for treatment of his disease. He is here today for his pre-procedure imaging for planning and to answer any additional questions he may have about this treatment.   PREVIOUS RADIATION THERAPY: No  PAST MEDICAL HISTORY:  Past Medical History:  Diagnosis Date   Atrial fibrillation (HCC)    Atrial flutter (HCC) 09/27/2016   Overview:  Chads score 0, only an aspirin    Formatting of this note might be different from the original. Chads score 0, only an aspirin    Atypical chest pain 06/30/2018   Cholelithiasis 02/17/2019   Drug-induced erectile dysfunction 12/28/2016   Dyslipidemia 06/11/2017   Dyspnea on exertion 11/20/2019   Dysrhythmia 2001   palpitations -checked out by Cardiologist   GERD (gastroesophageal reflux disease)    High cholesterol    Hypertension    Lumbar radiculopathy 06/08/2021   Major neurocognitive disorder due to Parkinson's disease, possible 08/24/2019   PAC (premature atrial contraction) 05/19/2020   PAF (paroxysmal atrial fibrillation) (HCC) 12/24/2019   Palpitations 09/27/2016   Parkinson disease (HCC) 09/19/2020   Patella fracture 08/05/2015   PONV (postoperative nausea and vomiting)    PVC (premature ventricular contraction) 11/20/2019   Supraventricular tachycardia (HCC) 09/27/2016   Tinnitus 12/28/2019      PAST SURGICAL HISTORY: Past Surgical History:  Procedure Laterality Date   APPENDECTOMY     CHOLECYSTECTOMY     LITHOTRIPSY     x 2   ORIF PATELLA Left 08/05/2015   Procedure: OPEN REDUCTION INTERNAL (ORIF) LEFT  FIXATION PATELLA;  Surgeon: Orvan Blanch, MD;  Location: [facility];  Service: Orthopedics;  Laterality: Left;   STONE EXTRACTION WITH BASKET     x2   TONSILLECTOMY      FAMILY HISTORY:  Family History  Problem Relation Age of Onset   Heart disease Mother    Heart failure Mother  Diabetes Father    Prostate cancer Father    Dementia Father     SOCIAL HISTORY:  Social History   Socioeconomic History   Marital status: Married    Spouse name: Not on file   Number of children: Not on file   Years of education: Not on file   Highest education level: Not on file  Occupational History   Not on file  Tobacco Use   Smoking status: Former    Current packs/day: 0.00    Average packs/day: 1.5 packs/day for 20.0 years (30.0 ttl pk-yrs)    Types: Cigarettes    Start date: 03/23/1969    Quit date: 03/23/1989     Years since quitting: 35.0   Smokeless tobacco: Never  Vaping Use   Vaping status: Never Used  Substance and Sexual Activity   Alcohol  use: Not Currently    Comment: none in last 2 years   Drug use: No   Sexual activity: Not on file  Other Topics Concern   Not on file  Social History Narrative   Right handed   Caffeine: none   Lives at home with wife   Social Drivers of Corporate investment banker Strain: Not on file  Food Insecurity: No Food Insecurity (03/19/2024)   Hunger Vital Sign    Worried About Running Out of Food in the Last Year: Never true    Ran Out of Food in the Last Year: Never true  Transportation Needs: No Transportation Needs (03/19/2024)   PRAPARE - Administrator, Civil Service (Medical): No    Lack of Transportation (Non-Medical): No  Physical Activity: Not on file  Stress: Not on file  Social Connections: Not on file  Intimate Partner Violence: Not At Risk (03/19/2024)   Humiliation, Afraid, Rape, and Kick questionnaire    Fear of Current or Ex-Partner: No    Emotionally Abused: No    Physically Abused: No    Sexually Abused: No    ALLERGIES: Patient has no known allergies.  MEDICATIONS:  Current Outpatient Medications  Medication Sig Dispense Refill   carbidopa-levodopa (SINEMET IR) 25-100 MG tablet Take 1 tablet by mouth 3 (three) times daily. HAVE NOT STARTED YET, TRANSITIONING FROM REQUIP      carvedilol  (COREG ) 3.125 MG tablet TAKE 1 TABLET(3.125 MG) BY MOUTH TWICE DAILY WITH A MEAL (Patient taking differently: Take 3.125 mg by mouth 2 (two) times daily with a meal.) 180 tablet 2   diltiazem (CARDIZEM) 30 MG tablet Take 30 mg by mouth as needed (Afib sx's). Take if heart rate goes over 120     ELIQUIS  5 MG TABS tablet TAKE 1 TABLET(5 MG) BY MOUTH TWICE DAILY (Patient taking differently: Take 5 mg by mouth 2 (two) times daily.) 180 tablet 1   gabapentin (NEURONTIN) 300 MG capsule Take 1 capsule by mouth as needed (Neuropathy).      ibuprofen (ADVIL) 200 MG tablet Take 200 mg by mouth every 6 (six) hours as needed for mild pain (pain score 1-3).     losartan  (COZAAR ) 50 MG tablet TAKE 1 TABLET(50 MG) BY MOUTH DAILY 90 tablet 2   MAGNESIUM  GLYCINATE PO Take 400 mg by mouth 2 (two) times daily.     omeprazole  (PRILOSEC) 20 MG capsule Take 1 capsule by mouth as needed (Indigestion).     rOPINIRole  (REQUIP ) 3 MG tablet Take 1 tablet (3 mg total) by mouth 4 (four) times daily. (Patient taking differently: Take 3 mg by mouth at bedtime.) 360  tablet 3   simvastatin (ZOCOR) 40 MG tablet Take 40 mg by mouth at bedtime.     No current facility-administered medications for this encounter.    REVIEW OF SYSTEMS:  On review of systems, the patient reports that he is doing well overall. He denies any chest pain, shortness of breath, cough, fevers, chills, night sweats, unintended weight changes. He denies any bowel disturbances, and denies abdominal pain, nausea or vomiting. He denies any new musculoskeletal or joint aches or pains. His IPSS was 9, indicating mild-moderate urinary symptoms. His SHIM was 7, indicating he has severe erectile dysfunction. A complete review of systems is obtained and is otherwise negative.   PHYSICAL EXAM:  Wt Readings from Last 3 Encounters:  02/13/24 181 lb (82.1 kg)  02/07/24 181 lb 12.8 oz (82.5 kg)  01/16/24 183 lb 6 oz (83.2 kg)   Temp Readings from Last 3 Encounters:  01/16/24 (!) 96.8 F (36 C) (Temporal)  08/06/15 99 F (37.2 C) (Oral)  08/04/15 99.2 F (37.3 C) (Oral)   BP Readings from Last 3 Encounters:  02/07/24 110/72  01/16/24 127/74  06/05/23 110/70   Pulse Readings from Last 3 Encounters:  02/07/24 (!) 58  01/16/24 (!) 52  06/05/23 60    /10  In general this is a well appearing Caucasian male in no acute distress. He's alert and oriented x4 and appropriate throughout the examination. Cardiopulmonary assessment is negative for acute distress, and he exhibits normal effort.      KPS = 100  100 - Normal; no complaints; no evidence of disease. 90   - Able to carry on normal activity; minor signs or symptoms of disease. 80   - Normal activity with effort; some signs or symptoms of disease. 40   - Cares for self; unable to carry on normal activity or to do active work. 60   - Requires occasional assistance, but is able to care for most of his personal needs. 50   - Requires considerable assistance and frequent medical care. 40   - Disabled; requires special care and assistance. 30   - Severely disabled; hospital admission is indicated although death not imminent. 20   - Very sick; hospital admission necessary; active supportive treatment necessary. 10   - Moribund; fatal processes progressing rapidly. 0     - Dead  Karnofsky DA, Abelmann WH, Craver LS and Burchenal [facility] [phone]) The use of the nitrogen mustards in the palliative treatment of carcinoma: with particular reference to bronchogenic carcinoma Cancer 1 [phone]  LABORATORY DATA:  Lab Results  Component Value Date   WBC 8.6 08/04/2015   HGB 15.3 08/04/2015   HCT 43.4 08/04/2015   MCV 89.5 08/04/2015   PLT 266 08/04/2015   Lab Results  Component Value Date   NA 140 10/28/2019   K 5.3 (H) 10/28/2019   CL 103 10/28/2019   CO2 26 10/28/2019   No results found for: "ALT", "AST", "GGT", "ALKPHOS", "BILITOT"   RADIOGRAPHY: VAS US  VASCUSCREEN Result Date: 03/13/2024 VASCULAR SCREENING EVALUATION Patient Name:  JACIN DUKES  Date of Exam:   03/11/2024 Medical Rec #: [id number]         Accession #:    [id number] Date of Birth: 22-Jul-1951        Patient Gender: M Patient Age:   56 years Exam Location:  [facility] Procedure:      VAS US  VASCUSCREEN Referring Phys: Ralene Burger --------------------------------------------------------------------------------  History: Pain of lower extremity, unspecified laterality [M79.606 (ICD-10-CM)].  Risk Factors: Hypertension. Other Factors: Atrial fibrillation,  Supraventricular tachycardia.  Performing Technologist: Reel, Jimmy RDCS, RVT  Examination Guidelines: A complete evaluation includes B-mode imaging, spectral Doppler, color Doppler, and power Doppler as needed of all accessible portions of each vessel. Bilateral testing is considered an integral part of a complete examination. Limited examinations for reoccurring indications may be performed as noted.  Carotid Findings: +---------+----------+----------+--------------------------------+          PSV (cm/s)EDV (cm/s)Comment                          +---------+----------+----------+--------------------------------+ Right ICA80        17        . 1-39% stenosis with shadowing. +---------+----------+----------+--------------------------------+ Left ICA 67        19        . 1-39% stenosis with shadowing. +---------+----------+----------+--------------------------------+  ABI: +----------------+------------+-----+-----------+-----+                 Right (mmHg)IndexLeft (mmHg)Index +----------------+------------+-----+-----------+-----+ Brachial        123              119              +----------------+------------+-----+-----------+-----+ Posterior Tibial70          0.57 88         0.72  +----------------+------------+-----+-----------+-----+ Dorsal Pedis    72               85               +----------------+------------+-----+-----------+-----+ Great Toe                   0.38            0.44  +----------------+------------+-----+-----------+-----+  Aortic Findings: +--------------+-------+----------+               AP (cm)Trans (cm) +--------------+-------+----------+ Aorta Proximal1.9    1.9        +--------------+-------+----------+ Aorta Distal  1.6    2.2        +--------------+-------+----------+  Summary: Right Carotid: Mild: plaque visualized in the right internal carotid artery. Left Carotid: Mild: plaque visualized in the left internal carotid artery.   Right ABI: The right ankle brachial index is abnormally low. This suggests the presence of significant arterial obstruction. A complete arterial examination and/or further consultation may be warranted. Left ABI: The left ankle brachial index is abnormally low. This suggests the presence of significant arterial obstruction. A complete arterial examination and/or further consultation may be warranted.  Aorta: There is no evidence of an abdominal aortic aneurysm.  *See table(s) above for measurements and observations.  Please share your results with your physician, who will incorporate them into a personal prevention and wellness program tailored to your needs. For any results outside of the normal range, it is imperative that your physician have this information.  Electronically signed by Ralene Burger MD on 03/13/2024 at 1:55:47 PM.    Final       IMPRESSION/PLAN: 1. 73 y.o. gentleman with Stage T1c adenocarcinoma of the prostate with Gleason score of 3+4, and PSA of 4.1.  The patient has elected to proceed with seed implant for treatment of his disease. We reviewed the risks, benefits, short and long-term effects associated with brachytherapy and discussed the role of SpaceOAR in reducing the rectal toxicity associated with radiotherapy.  He appears to have a good understanding of his disease and our treatment recommendations which  are of curative intent.  He was encouraged to ask questions that were answered to his stated satisfaction. He has freely signed written consent to proceed today in the office and a copy of this document will be placed in his medical record. His procedure is tentatively scheduled for 04/14/24 in collaboration with Dr. Sherrine Bowen and we will see him back for his post-procedure visit approximately 3 weeks thereafter. We look forward to continuing to participate in his care. He knows that he is welcome to call with any questions or concerns at any time in the interim.  I personally spent  30 minutes in this encounter including chart review, reviewing radiological studies, meeting face-to-face with the patient, entering orders and completing documentation.    Arta Bihari, MMS, PA-C [facility] Radiation Oncology Physician Assistant Direct Dial: [phone]  Fax: [phone]

## 2024-03-31 NOTE — Progress Notes (Addendum)
 COVID Vaccine Completed:  Date of COVID positive in last 90 days:  PCP - [name], MD Cardiologist - Ralene Burger, MD LOV 02/07/24 Electrophysiologist - Agatha Horsfall, MD Neurologist- Alejo Hurter, MD LOV 09/30/23  Cardiac clearance by Marlana Silvan, NP 03/03/24 in Epic  Chest x-ray - n/a EKG - 02/07/24 Epic Stress Test - 02/13/24 Epic ECHO - 07/01/24 Epic Cardiac Cath - n/a Pacemaker/ICD device last checked: n/a Spinal Cord Stimulator: n/a  Bowel Prep - clear liquid diet day before, fleet enema  Sleep Study - yes, negative CPAP -   Fasting Blood Sugar - n/a Checks Blood Sugar _____ times a day  Last dose of GLP1 agonist-  N/A GLP1 instructions:  Hold 7 days before surgery    Last dose of SGLT-2 inhibitors-  N/A SGLT-2 instructions:  Hold 3 days before surgery    Blood Thinner Instructions:  Eliquis , hold 2 days Aspirin  Instructions: Last Dose: 04/11/24 2200  Activity level: Can go up a flight of stairs and perform activities of daily living without stopping and without symptoms of chest pain or shortness of breath.  Anesthesia review: atrial flutter, SVT, PAF, PVC, parkinson's, HTN  Patient denies shortness of breath, fever, cough and chest pain at PAT appointment  Patient verbalized understanding of instructions that were given to them at the PAT appointment. Patient was also instructed that they will need to review over the PAT instructions again at home before surgery.

## 2024-03-31 NOTE — Patient Instructions (Addendum)
 SURGICAL WAITING ROOM VISITATION  Patients having surgery or a procedure may have no more than 2 support people in the waiting area - these visitors may rotate.    Children under the age of 103 must have an adult with them who is not the patient.  Due to an increase in RSV and influenza rates and associated hospitalizations, children ages 31 and under may not visit patients in [facility] hospitals.  Visitors with respiratory illnesses are discouraged from visiting and should remain at home.  If the patient needs to stay at the hospital during part of their recovery, the visitor guidelines for inpatient rooms apply. Pre-op nurse will coordinate an appropriate time for 1 support person to accompany patient in pre-op.  This support person may not rotate.    Please refer to the [facility] website for the visitor guidelines for Inpatients (after your surgery is over and you are in a regular room).    Your procedure is scheduled on: 04/14/24   Report to [facility] Main Entrance    Report to admitting at 5:15 AM   Call this number if you have problems the morning of surgery [phone]   Follow a clear liquid diet the day before surgery.  Water Non-Citrus Juices (without pulp, NO RED-Apple, White grape, White cranberry) Black Coffee (NO MILK/CREAM OR CREAMERS, sugar ok)  Clear Tea (NO MILK/CREAM OR CREAMERS, sugar ok) regular and decaf                             Plain Jell-O (NO RED)                                           Fruit ices (not with fruit pulp, NO RED)                                     Popsicles (NO RED)                                                               Sports drinks like Gatorade (NO RED)               Nothing to drink after midnight.          If you have questions, please contact your surgeon's office.   FOLLOW BOWEL PREP AND ANY ADDITIONAL PRE OP INSTRUCTIONS YOU RECEIVED FROM YOUR SURGEON'S OFFICE!!!     Oral Hygiene is also important to  reduce your risk of infection.                                    Remember - BRUSH YOUR TEETH THE MORNING OF SURGERY WITH YOUR REGULAR TOOTHPASTE  DENTURES WILL BE REMOVED PRIOR TO SURGERY PLEASE DO NOT APPLY "Poly grip" OR ADHESIVES!!!   Stop all vitamins and herbal supplements 7 days before surgery.   Take these medicines the morning of surgery with A SIP OF WATER: Carbidopa- Levodopa, Diltiazem, Gabapentin, Carvedilol , Omeprazole   You may not have any metal on your body including jewelry, and body piercing             Do not wear lotions, powders, cologne, or deodorant              Men may shave face and neck.   Do not bring valuables to the hospital. [facility] IS NOT             RESPONSIBLE   FOR VALUABLES.   Contacts, glasses, dentures or bridgework may not be worn into surgery.  DO NOT BRING YOUR HOME MEDICATIONS TO THE HOSPITAL. PHARMACY WILL DISPENSE MEDICATIONS LISTED ON YOUR MEDICATION LIST TO YOU DURING YOUR ADMISSION IN THE HOSPITAL!    Patients discharged on the day of surgery will not be allowed to drive home.  Someone NEEDS to stay with you for the first 24 hours after anesthesia.   Special Instructions: Bring a copy of your healthcare power of attorney and living will documents the day of surgery if you haven't scanned them before.              Please read over the following fact sheets you were given: IF YOU HAVE QUESTIONS ABOUT YOUR PRE-OP INSTRUCTIONS PLEASE CALL [phone] Party   If you received a COVID test during your pre-op visit  it is requested that you wear a mask when out in public, stay away from anyone that may not be feeling well and notify your surgeon if you develop symptoms. If you test positive for Covid or have been in contact with anyone that has tested positive in the last 10 days please notify you surgeon.    [facility] - Preparing for Surgery Before surgery, you can play an important role.  Because skin is not  sterile, your skin needs to be as free of germs as possible.  You can reduce the number of germs on your skin by washing with CHG (chlorahexidine gluconate) soap before surgery.  CHG is an antiseptic cleaner which kills germs and bonds with the skin to continue killing germs even after washing. Please DO NOT use if you have an allergy to CHG or antibacterial soaps.  If your skin becomes reddened/irritated stop using the CHG and inform your nurse when you arrive at Short Stay. Do not shave (including legs and underarms) for at least 48 hours prior to the first CHG shower.  You may shave your face/neck.  Please follow these instructions carefully:  1.  Shower with CHG Soap the night before surgery and the  morning of surgery.  2.  If you choose to wash your hair, wash your hair first as usual with your normal  shampoo.  3.  After you shampoo, rinse your hair and body thoroughly to remove the shampoo.                             4.  Use CHG as you would any other liquid soap.  You can apply chg directly to the skin and wash.  Gently with a scrungie or clean washcloth.  5.  Apply the CHG Soap to your body ONLY FROM THE NECK DOWN.   Do   not use on face/ open                           Wound or open sores. Avoid contact with eyes, ears mouth and  genitals (private parts).                       Wash face,  Genitals (private parts) with your normal soap.             6.  Wash thoroughly, paying special attention to the area where your    surgery  will be performed.  7.  Thoroughly rinse your body with warm water from the neck down.  8.  DO NOT shower/wash with your normal soap after using and rinsing off the CHG Soap.                9.  Pat yourself dry with a clean towel.            10.  Wear clean pajamas.            11.  Place clean sheets on your bed the night of your first shower and do not  sleep with pets. Day of Surgery : Do not apply any lotions/deodorants the morning of surgery.  Please wear  clean clothes to the hospital/surgery center.  FAILURE TO FOLLOW THESE INSTRUCTIONS MAY RESULT IN THE CANCELLATION OF YOUR SURGERY  PATIENT SIGNATURE_________________________________  NURSE SIGNATURE__________________________________  ________________________________________________________________________

## 2024-04-01 ENCOUNTER — Encounter (HOSPITAL_COMMUNITY): Payer: Self-pay

## 2024-04-01 ENCOUNTER — Other Ambulatory Visit: Payer: Self-pay

## 2024-04-01 ENCOUNTER — Encounter (HOSPITAL_COMMUNITY)
Admission: RE | Admit: 2024-04-01 | Discharge: 2024-04-01 | Disposition: A | Discharge status: Home / Self Care | Source: Ambulatory Visit | Attending: Urology | Admitting: Urology

## 2024-04-01 VITALS — BP 113/68 | HR 55 | Temp 97.7°F | Resp 16 | Ht 70.0 in | Wt 178.6 lb

## 2024-04-01 DIAGNOSIS — Z01818 Encounter for other preprocedural examination: Secondary | ICD-10-CM | POA: Diagnosis present

## 2024-04-01 DIAGNOSIS — Z01812 Encounter for preprocedural laboratory examination: Secondary | ICD-10-CM | POA: Diagnosis not present

## 2024-04-01 DIAGNOSIS — I480 Paroxysmal atrial fibrillation: Secondary | ICD-10-CM | POA: Insufficient documentation

## 2024-04-01 HISTORY — DX: Personal history of urinary calculi: Z87.442

## 2024-04-01 HISTORY — DX: Inflammatory liver disease, unspecified: K75.9

## 2024-04-01 LAB — CBC
HCT: 47.2 % (ref 39.0–52.0)
Hemoglobin: 15 g/dL (ref 13.0–17.0)
MCH: 30.3 pg (ref 26.0–34.0)
MCHC: 31.8 g/dL (ref 30.0–36.0)
MCV: 95.4 fL (ref 80.0–100.0)
Platelets: 215 10*3/uL (ref 150–400)
RBC: 4.95 MIL/uL (ref 4.22–5.81)
RDW: 13.9 % (ref 11.5–15.5)
WBC: 9.8 10*3/uL (ref 4.0–10.5)
nRBC: 0 % (ref 0.0–0.2)

## 2024-04-01 LAB — BASIC METABOLIC PANEL WITH GFR
Anion gap: 8 (ref 5–15)
BUN: 22 mg/dL (ref 8–23)
CO2: 25 mmol/L (ref 22–32)
Calcium: 9.2 mg/dL (ref 8.9–10.3)
Chloride: 106 mmol/L (ref 98–111)
Creatinine, Ser: 0.82 mg/dL (ref 0.61–1.24)
GFR, Estimated: >60 mL/min (ref >60)
Glucose, Bld: 71 mg/dL (ref 70–99)
Potassium: 4 mmol/L (ref 3.5–5.1)
Sodium: 139 mmol/L (ref 135–145)

## 2024-04-01 NOTE — Progress Notes (Signed)
 DISCUSSION: Alexander Bowen is a 73 yo male who presents to PAT prior to RADIOACTIVE SEED IMPLANT AND SPACE OAR on 04/14/24 with Dr. Sherrine Dolly. PMH of former smoking, HTN, aortic atherosclerosis, A.fib/flutter, SVT, PAD, Parkinsons disease, GERD  Prior anesthesia complications include PONV  Pt follows with Cardiology for PAF and aortic atherosclerosis. He takes Eliquis  and Coreg . Last seen by Dr. Krasowski on 02/07/24. PT expressed concern about the extensive atherosclerosis noted on his MRI pelvis. He was sent for vascuscreen ultrasound of his carotids, aorta, and ABIs. Carotid and aorta showed mild plaque however ABIs were abnormally low and was recommended to have dedicated US  lower extremitiy ABI. He was also advised to have a stress test done which came back normal.  Lower extremity studies done on 5/30. C/f significant stenosis in the R SFA and vascular consult recommended however pt reports he can walk one mile w/o symptoms. Discussed with Dr. Noreene Bearded. Ok to proceed.  LD Eliquis  04/11/24 at 2200  VS: BP 113/68   Pulse (!) 55   Temp 36.5 C (Oral)   Resp 16   Ht 5\' 10"  (1.778 m)   Wt 81 kg   SpO2 100%   BMI 25.63 kg/m   PROVIDERS: [name], MD Cardiologist - Ralene Burger, MD LOV 02/07/24 Electrophysiologist - Agatha Horsfall, MD Neurologist- Alejo Hurter, MD LOV 09/30/23  LABS: Labs reviewed: Acceptable for surgery. (all labs ordered are listed, but only abnormal results are displayed)  Labs Reviewed  BASIC METABOLIC PANEL WITH GFR  CBC     IMAGES:  MRI Pelvis 11/16/23:  IMPRESSION: 1. Two PI-RADS category 5 lesions primarily involving the peripheral zone. Targeting data sent to [facility]. 2. Mild prostatomegaly and benign prostatic hypertrophy. 3. Substantial arterial atherosclerosis in the pelvis. 4. Left hamstring tendinopathy.  EKG 02/07/24:  Sinus bradycardia with Premature atrial complexes, rate 58 Left posterior fascicular block Abnormal  ECG  CV:   VAS US  LE arterial bilateral 04/10/24:  Summary:  Right: Extensive bulky calcified plaque throughout.  30-49% stenosis in the mid CFA.  75-99% stenosis in the mid/distal SFA, high end range, based on VR 5.8.  50-74% stenosis in the mid TPT.   Left: Extensive bulky calcified plaque throughout.  50-74% stenosis in the mid SFA.  30-49% stenosis in the distal CFA, high end range.  50-74% stenosis in the DFA and ostial SFA.   US  ABI 04/10/24:  Summary:  Right: Resting right ankle-brachial index indicates mild right lower  extremity arterial disease. The right toe-brachial index is abnormal.   Left: Resting left ankle-brachial index indicates moderate left lower  extremity arterial disease. The left toe-brachial index is abnormal.   US  Vascuscreen 03/11/24:  Summary: Right Carotid: Mild: plaque visualized in the right internal carotid artery. Left Carotid: Mild: plaque visualized in the left internal carotid artery.   Right ABI: The right ankle brachial index is abnormally low. This suggests the presence of significant arterial obstruction. A complete arterial examination and/or further consultation may be warranted. Left ABI: The left ankle brachial index is abnormally low. This suggests the presence of significant arterial obstruction. A complete arterial examination and/or further consultation may be warranted.   Aorta: There is no evidence of an abdominal aortic aneurysm.  Stress test 02/13/24:    The study is normal. The study is low risk.   No ST deviation was noted.   Left ventricular function is normal. Nuclear stress EF: 55%. The left ventricular ejection fraction is normal (55-65%). End diastolic cavity size is  normal.   Prior study available for comparison from 07/18/2021.    Echo 07/02/23:  IMPRESSIONS    1. Left ventricular ejection fraction, by estimation, is 60 to 65%. The left ventricle has normal function. The left ventricle has no regional wall  motion abnormalities. There is mild concentric left ventricular hypertrophy. Left ventricular diastolic parameters were normal. The average left ventricular global longitudinal strain is -19.1 %. The global longitudinal strain is normal.  2. Right ventricular systolic function is normal. The right ventricular size is normal. There is normal pulmonary artery systolic pressure.  3. The mitral valve is normal in structure. No evidence of mitral valve regurgitation. No evidence of mitral stenosis.  4. The aortic valve is tricuspid. Aortic valve regurgitation is not visualized. Aortic valve sclerosis is present, with no evidence of aortic valve stenosis.  5. Aortic Normal DTA.  6. The inferior vena cava is normal in size with greater than 50% respiratory variability, suggesting right atrial pressure of 3 mmHg.  Past Medical History:  Diagnosis Date   Atrial fibrillation [facility])    Atrial flutter (HCC) 09/27/2016   Overview:  Chads score 0, only an aspirin   Formatting of this note might be different from the original. Chads score 0, only an aspirin    Atypical chest pain 06/30/2018   Cholelithiasis 02/17/2019   Drug-induced erectile dysfunction 12/28/2016   Dyslipidemia 06/11/2017   Dyspnea on exertion 11/20/2019   Dysrhythmia 2001   palpitations -checked out by Cardiologist   GERD (gastroesophageal reflux disease)    Hepatitis    A   High cholesterol    History of kidney stones    Hypertension    Lumbar radiculopathy 06/08/2021   Major neurocognitive disorder due to Parkinson's disease, possible 08/24/2019   PAC (premature atrial contraction) 05/19/2020   PAF (paroxysmal atrial fibrillation) (HCC) 12/24/2019   Palpitations 09/27/2016   Parkinson disease (HCC) 09/19/2020   Patella fracture 08/05/2015   PONV (postoperative nausea and vomiting)    PVC (premature ventricular contraction) 11/20/2019   Supraventricular tachycardia (HCC) 09/27/2016   Tinnitus 12/28/2019    Past  Surgical History:  Procedure Laterality Date   APPENDECTOMY     CHOLECYSTECTOMY     HYDROCELE EXCISION / REPAIR Right    LITHOTRIPSY     x 2   ORIF PATELLA Left 08/05/2015   Procedure: OPEN REDUCTION INTERNAL (ORIF) LEFT  FIXATION PATELLA;  Surgeon: Orvan Blanch, MD;  Location: [facility] ORS;  Service: Orthopedics;  Laterality: Left;   STONE EXTRACTION WITH BASKET     x2   TONSILLECTOMY      MEDICATIONS:  carbidopa-levodopa (SINEMET IR) 25-100 MG tablet   carvedilol  (COREG ) 3.125 MG tablet   diltiazem (CARDIZEM) 30 MG tablet   ELIQUIS  5 MG TABS tablet   gabapentin (NEURONTIN) 300 MG capsule   ibuprofen (ADVIL) 200 MG tablet   losartan  (COZAAR ) 50 MG tablet   MAGNESIUM  GLYCINATE PO   omeprazole  (PRILOSEC) 40 MG capsule   polyethylene glycol (MIRALAX / GLYCOLAX) 17 g packet   rOPINIRole  (REQUIP ) 3 MG tablet   simvastatin (ZOCOR) 40 MG tablet   No current facility-administered medications for this encounter.   Antoinette Kirschner [facility] Surgical Short Stay/Anesthesiology [facility] Phone [phone] 04/13/2024 9:30 AM

## 2024-04-08 ENCOUNTER — Encounter (HOSPITAL_COMMUNITY): Payer: Self-pay | Admitting: Cardiology

## 2024-04-10 ENCOUNTER — Ambulatory Visit (HOSPITAL_COMMUNITY)
Admission: RE | Admit: 2024-04-10 | Discharge: 2024-04-10 | Disposition: A | Discharge status: Home / Self Care | Source: Ambulatory Visit | Attending: Cardiology | Admitting: Cardiology

## 2024-04-10 DIAGNOSIS — M79606 Pain in leg, unspecified: Secondary | ICD-10-CM | POA: Insufficient documentation

## 2024-04-10 DIAGNOSIS — M79605 Pain in left leg: Secondary | ICD-10-CM

## 2024-04-13 ENCOUNTER — Telehealth: Payer: Self-pay | Admitting: *Deleted

## 2024-04-13 ENCOUNTER — Other Ambulatory Visit: Payer: Self-pay | Admitting: Urology

## 2024-04-13 DIAGNOSIS — C61 Malignant neoplasm of prostate: Secondary | ICD-10-CM

## 2024-04-13 LAB — VAS US ABI WITH/WO TBI
Left ABI: 0.74
Right ABI: 0.83

## 2024-04-13 NOTE — Anesthesia Preprocedure Evaluation (Addendum)
 Anesthesia Evaluation  Patient identified by MRN, date of birth, ID band Patient awake    Reviewed: Allergy & Precautions, NPO status , Patient's Chart, lab work & pertinent test results  History of Anesthesia Complications (+) PONV and history of anesthetic complications  Airway Mallampati: III  TM Distance: >3 FB Neck ROM: Full    Dental  (+) Dental Advisory Given   Pulmonary neg shortness of breath, neg sleep apnea, neg COPD, neg recent URI, former smoker   Pulmonary exam normal breath sounds clear to auscultation       Cardiovascular hypertension (carvedilol , losartan ), Pt. on home beta blockers and Pt. on medications (-) angina (-) Past MI, (-) Cardiac Stents and (-) CABG + dysrhythmias (PVCs, PACs) Atrial Fibrillation and Supra Ventricular Tachycardia  Rhythm:Irregular Rate:Normal  HLD  Normal stress test 02/13/2024  TTE 07/02/2023: IMPRESSIONS    1. Left ventricular ejection fraction, by estimation, is 60 to 65%. The  left ventricle has normal function. The left ventricle has no regional  wall motion abnormalities. There is mild concentric left ventricular  hypertrophy. Left ventricular diastolic  parameters were normal. The average left ventricular global longitudinal  strain is -19.1 %. The global longitudinal strain is normal.   2. Right ventricular systolic function is normal. The right ventricular  size is normal. There is normal pulmonary artery systolic pressure.   3. The mitral valve is normal in structure. No evidence of mitral valve  regurgitation. No evidence of mitral stenosis.   4. The aortic valve is tricuspid. Aortic valve regurgitation is not  visualized. Aortic valve sclerosis is present, with no evidence of aortic  valve stenosis.   5. Aortic Normal DTA.   6. The inferior vena cava is normal in size with greater than 50%  respiratory variability, suggesting right atrial pressure of 3 mmHg.      Neuro/Psych neg Seizures  Neuromuscular disease (Parkinsons disease, lumbar radiculopathy)    GI/Hepatic ,GERD  Medicated,,(+) Hepatitis -, A  Endo/Other  negative endocrine ROS    Renal/GU negative Renal ROS   Prostate cancer    Musculoskeletal   Abdominal   Peds  Hematology negative hematology ROS (+) Lab Results      Component                Value               Date                      WBC                      9.8                 04/01/2024                HGB                      15.0                04/01/2024                HCT                      47.2                04/01/2024                MCV  95.4                04/01/2024                PLT                      215                 04/01/2024              Anesthesia Other Findings Last Eliquis : 04/11/2024  Reproductive/Obstetrics                             Anesthesia Physical Anesthesia Plan  ASA: 3  Anesthesia Plan: General   Post-op Pain Management: Tylenol  PO (pre-op)*   Induction: Intravenous  PONV Risk Score and Plan: 3 and Ondansetron , Dexamethasone  and Treatment may vary due to age or medical condition  Airway Management Planned: Oral ETT  Additional Equipment:   Intra-op Plan:   Post-operative Plan: Extubation in OR  Informed Consent: I have reviewed the patients History and Physical, chart, labs and discussed the procedure including the risks, benefits and alternatives for the proposed anesthesia with the patient or authorized representative who has indicated his/her understanding and acceptance.     Dental advisory given  Plan Discussed with: Anesthesiologist and CRNA  Anesthesia Plan Comments: (See PAT note from 5/21  Risks of general anesthesia discussed including, but not limited to, sore throat, hoarse voice, chipped/damaged teeth, injury to vocal cords, nausea and vomiting, allergic reactions, lung infection, heart attack, stroke, and  death. All questions answered. )        Anesthesia Quick Evaluation

## 2024-04-13 NOTE — Telephone Encounter (Signed)
 Called  patient to remind of procedure for 04-14-24, spoke with patient and he is aware of this procedure

## 2024-04-14 ENCOUNTER — Ambulatory Visit (HOSPITAL_COMMUNITY): Admitting: Medical

## 2024-04-14 ENCOUNTER — Ambulatory Visit (HOSPITAL_COMMUNITY)
Admission: RE | Admit: 2024-04-14 | Discharge: 2024-04-14 | Disposition: A | Discharge status: Home / Self Care | Attending: Urology | Admitting: Urology

## 2024-04-14 ENCOUNTER — Ambulatory Visit (HOSPITAL_COMMUNITY): Admitting: Anesthesiology

## 2024-04-14 ENCOUNTER — Ambulatory Visit (HOSPITAL_COMMUNITY)

## 2024-04-14 ENCOUNTER — Other Ambulatory Visit: Payer: Self-pay

## 2024-04-14 ENCOUNTER — Encounter (HOSPITAL_COMMUNITY): Payer: Self-pay | Admitting: Urology

## 2024-04-14 ENCOUNTER — Encounter (HOSPITAL_COMMUNITY)
Admission: RE | Disposition: A | Discharge status: Home / Self Care | Payer: Self-pay | Source: Home / Self Care | Attending: Urology

## 2024-04-14 DIAGNOSIS — E7800 Pure hypercholesterolemia, unspecified: Secondary | ICD-10-CM | POA: Diagnosis not present

## 2024-04-14 DIAGNOSIS — Z87891 Personal history of nicotine dependence: Secondary | ICD-10-CM | POA: Diagnosis not present

## 2024-04-14 DIAGNOSIS — I4891 Unspecified atrial fibrillation: Secondary | ICD-10-CM

## 2024-04-14 DIAGNOSIS — E785 Hyperlipidemia, unspecified: Secondary | ICD-10-CM | POA: Diagnosis not present

## 2024-04-14 DIAGNOSIS — G20A1 Parkinson's disease without dyskinesia, without mention of fluctuations: Secondary | ICD-10-CM | POA: Insufficient documentation

## 2024-04-14 DIAGNOSIS — I10 Essential (primary) hypertension: Secondary | ICD-10-CM | POA: Diagnosis not present

## 2024-04-14 DIAGNOSIS — Z7901 Long term (current) use of anticoagulants: Secondary | ICD-10-CM | POA: Insufficient documentation

## 2024-04-14 DIAGNOSIS — Z79899 Other long term (current) drug therapy: Secondary | ICD-10-CM | POA: Diagnosis not present

## 2024-04-14 DIAGNOSIS — K219 Gastro-esophageal reflux disease without esophagitis: Secondary | ICD-10-CM | POA: Diagnosis not present

## 2024-04-14 DIAGNOSIS — C61 Malignant neoplasm of prostate: Secondary | ICD-10-CM

## 2024-04-14 HISTORY — PX: RADIOACTIVE SEED IMPLANT: SHX5150

## 2024-04-14 HISTORY — PX: SPACE OAR INSTILLATION: SHX6769

## 2024-04-14 SURGERY — INSERTION, RADIATION SOURCE, PROSTATE
Anesthesia: General | Site: Prostate

## 2024-04-14 MED ORDER — EPHEDRINE SULFATE-NACL 50-0.9 MG/10ML-% IV SOSY
PREFILLED_SYRINGE | INTRAVENOUS | Status: DC | PRN
  Administered 2024-04-14: 10 mg via INTRAVENOUS
  Administered 2024-04-14: 5 mg via INTRAVENOUS
  Administered 2024-04-14 (×2): 10 mg via INTRAVENOUS
  Administered 2024-04-14: 5 mg via INTRAVENOUS

## 2024-04-14 MED ORDER — ROCURONIUM BROMIDE 100 MG/10ML IV SOLN
INTRAVENOUS | Status: DC | PRN
  Administered 2024-04-14: 60 mg via INTRAVENOUS
  Administered 2024-04-14: 20 mg via INTRAVENOUS

## 2024-04-14 MED ORDER — PROPOFOL 10 MG/ML IV BOLUS
INTRAVENOUS | Status: AC
  Filled 2024-04-14: qty 20

## 2024-04-14 MED ORDER — SODIUM CHLORIDE (PF) 0.9 % IJ SOLN
INTRAMUSCULAR | Status: AC
  Filled 2024-04-14: qty 50

## 2024-04-14 MED ORDER — PROPOFOL 10 MG/ML IV BOLUS
INTRAVENOUS | Status: DC | PRN
  Administered 2024-04-14: 150 mg via INTRAVENOUS

## 2024-04-14 MED ORDER — EPHEDRINE 5 MG/ML INJ
INTRAVENOUS | Status: AC
  Filled 2024-04-14: qty 5

## 2024-04-14 MED ORDER — CHLORHEXIDINE GLUCONATE 0.12 % MT SOLN
15.0000 mL | Freq: Once | OROMUCOSAL | Status: AC
  Administered 2024-04-14: 15 mL via OROMUCOSAL

## 2024-04-14 MED ORDER — SODIUM CHLORIDE (PF) 0.9 % IJ SOLN
INTRAMUSCULAR | Status: AC
  Filled 2024-04-14: qty 10

## 2024-04-14 MED ORDER — SODIUM CHLORIDE 0.9 % IV SOLN: INTRAVENOUS | Status: DC | PRN

## 2024-04-14 MED ORDER — FLEET ENEMA RE ENEM: 1.0000 | ENEMA | Freq: Once | RECTAL | Status: DC

## 2024-04-14 MED ORDER — SUGAMMADEX SODIUM 200 MG/2ML IV SOLN
INTRAVENOUS | Status: DC | PRN
  Administered 2024-04-14: 50 mg via INTRAVENOUS
  Administered 2024-04-14: 200 mg via INTRAVENOUS

## 2024-04-14 MED ORDER — SODIUM CHLORIDE (PF) 0.9 % IJ SOLN
INTRAMUSCULAR | Status: DC | PRN
  Administered 2024-04-14: 50 mL

## 2024-04-14 MED ORDER — ROCURONIUM BROMIDE 10 MG/ML (PF) SYRINGE
PREFILLED_SYRINGE | INTRAVENOUS | Status: AC
  Filled 2024-04-14: qty 10

## 2024-04-14 MED ORDER — LIDOCAINE HCL (PF) 2 % IJ SOLN
INTRAMUSCULAR | Status: AC
  Filled 2024-04-14: qty 5

## 2024-04-14 MED ORDER — MIDAZOLAM HCL 5 MG/5ML IJ SOLN
INTRAMUSCULAR | Status: DC | PRN
  Administered 2024-04-14: 1 mg via INTRAVENOUS

## 2024-04-14 MED ORDER — ONDANSETRON HCL 4 MG/2ML IJ SOLN
INTRAMUSCULAR | Status: AC
  Filled 2024-04-14: qty 2

## 2024-04-14 MED ORDER — IOHEXOL 300 MG/ML  SOLN
INTRAMUSCULAR | Status: DC | PRN
  Administered 2024-04-14: 5 mL

## 2024-04-14 MED ORDER — ONDANSETRON HCL 4 MG/2ML IJ SOLN
INTRAMUSCULAR | Status: DC | PRN
Start: 2024-04-14 — End: 2024-04-14
  Administered 2024-04-14: 4 mg via INTRAVENOUS

## 2024-04-14 MED ORDER — DEXAMETHASONE SODIUM PHOSPHATE 10 MG/ML IJ SOLN
INTRAMUSCULAR | Status: AC
  Filled 2024-04-14: qty 1

## 2024-04-14 MED ORDER — DEXAMETHASONE SODIUM PHOSPHATE 10 MG/ML IJ SOLN
INTRAMUSCULAR | Status: DC | PRN
  Administered 2024-04-14: 5 mg via INTRAVENOUS

## 2024-04-14 MED ORDER — ACETAMINOPHEN 500 MG PO TABS
1000.0000 mg | ORAL_TABLET | Freq: Once | ORAL | Status: AC
  Administered 2024-04-14: 1000 mg via ORAL
  Filled 2024-04-14: qty 2

## 2024-04-14 MED ORDER — STERILE WATER FOR IRRIGATION IR SOLN
Status: DC | PRN
  Administered 2024-04-14: 1000 mL

## 2024-04-14 MED ORDER — ORAL CARE MOUTH RINSE
15.0000 mL | Freq: Once | OROMUCOSAL | Status: AC
Start: 2024-04-14 — End: 2024-04-14

## 2024-04-14 MED ORDER — LACTATED RINGERS IV SOLN: INTRAVENOUS | Status: DC

## 2024-04-14 MED ORDER — SODIUM CHLORIDE 0.9 % IV SOLN
2.0000 g | Freq: Once | INTRAVENOUS | Status: AC
  Administered 2024-04-14: 2 g via INTRAVENOUS
  Filled 2024-04-14: qty 20

## 2024-04-14 MED ORDER — MIDAZOLAM HCL 2 MG/2ML IJ SOLN
INTRAMUSCULAR | Status: AC
  Filled 2024-04-14: qty 2

## 2024-04-14 MED ORDER — FENTANYL CITRATE (PF) 100 MCG/2ML IJ SOLN
INTRAMUSCULAR | Status: DC | PRN
  Administered 2024-04-14: 100 ug via INTRAVENOUS

## 2024-04-14 MED ORDER — LIDOCAINE HCL (CARDIAC) PF 100 MG/5ML IV SOSY
PREFILLED_SYRINGE | INTRAVENOUS | Status: DC | PRN
  Administered 2024-04-14: 100 mg via INTRAVENOUS

## 2024-04-14 MED ORDER — FENTANYL CITRATE (PF) 100 MCG/2ML IJ SOLN
INTRAMUSCULAR | Status: AC
  Filled 2024-04-14: qty 2

## 2024-04-14 SURGICAL SUPPLY — 34 items
BAG URINE DRAIN 2000ML AR STRL (UROLOGICAL SUPPLIES) ×2 IMPLANT
BARD QUICKLINK CARTRIDGES WITH BRACHYSOURCE I-125 IMPLANT
CATH FOLEY 2WAY SLVR 5CC 16FR (CATHETERS) IMPLANT
CATH ROBINSON RED A/P 20FR (CATHETERS) ×2 IMPLANT
COVER BACK TABLE 60X90IN (DRAPES) ×2 IMPLANT
COVER MAYO STAND STRL (DRAPES) ×2 IMPLANT
COVER SURGICAL LIGHT HANDLE (MISCELLANEOUS) ×2 IMPLANT
DRSG TEGADERM 4X4.75 (GAUZE/BANDAGES/DRESSINGS) ×2 IMPLANT
DRSG TEGADERM 8X12 (GAUZE/BANDAGES/DRESSINGS) ×2 IMPLANT
GLOVE BIO SURGEON STRL SZ7.5 (GLOVE) ×2 IMPLANT
GLOVE ECLIPSE 8.0 STRL XLNG CF (GLOVE) ×2 IMPLANT
GLOVE SURG LX STRL 8.0 MICRO (GLOVE) ×4 IMPLANT
GLOVE SURG ORTHO 8.5 STRL (GLOVE) ×8 IMPLANT
GOWN STRL REUS W/ TWL LRG LVL3 (GOWN DISPOSABLE) ×2 IMPLANT
GOWN STRL REUS W/ TWL XL LVL3 (GOWN DISPOSABLE) ×4 IMPLANT
GRID BRACH TEMP 18GA 2.8X3X.75 (MISCELLANEOUS) ×2 IMPLANT
HOLDER FOLEY CATH W/STRAP (MISCELLANEOUS) ×2 IMPLANT
IMPL SPACEOAR SYSTEM 10ML (Spacer) ×2 IMPLANT
KIT TURNOVER KIT A (KITS) IMPLANT
MARKER SKIN DUAL TIP RULER LAB (MISCELLANEOUS) ×2 IMPLANT
NDL BRACHY 18G 5PK (NEEDLE) ×8 IMPLANT
NDL BRACHY 18G SINGLE (NEEDLE) IMPLANT
NDL PK MORGANSTERN STABILIZ (NEEDLE) ×2 IMPLANT
NEEDLE BRACHY 18G 5PK (NEEDLE) ×8 IMPLANT
NEEDLE BRACHY 18G SINGLE (NEEDLE) IMPLANT
NEEDLE PK MORGANSTERN STABILIZ (NEEDLE) ×2 IMPLANT
PACK CYSTO (CUSTOM PROCEDURE TRAY) ×2 IMPLANT
SHEATH ULTRASOUND LF (SHEATH) IMPLANT
SHEATH ULTRASOUND LTX NONSTRL (SHEATH) IMPLANT
SURGILUBE 2OZ TUBE FLIPTOP (MISCELLANEOUS) ×2 IMPLANT
SYR 10ML LL (SYRINGE) ×2 IMPLANT
TOWEL OR 17X26 10 PK STRL BLUE (TOWEL DISPOSABLE) ×2 IMPLANT
TRAY FOLEY MTR SLVR 16FR STAT (SET/KITS/TRAYS/PACK) ×2 IMPLANT
UNDERPAD 30X36 HEAVY ABSORB (UNDERPADS AND DIAPERS) ×4 IMPLANT

## 2024-04-14 NOTE — Transfer of Care (Signed)
 Immediate Anesthesia Transfer of Care Note  Patient: Alexander Bowen  Procedure(s) Performed: INSERTION, RADIATION SOURCE, PROSTATE (Prostate) INJECTION, HYDROGEL SPACER (Perineum)  Patient Location: PACU  Anesthesia Type:General  Level of Consciousness: awake, alert , oriented, and patient cooperative  Airway & Oxygen Therapy: Patient Spontanous Breathing and Patient connected to face mask oxygen  Post-op Assessment: Report given to RN and Post -op Vital signs reviewed and stable  Post vital signs: Reviewed and stable  Last Vitals:  Vitals Value Taken Time  BP 126/64 04/14/24 0915  Temp    Pulse 55 04/14/24 0914  Resp 21 04/14/24 0915  SpO2 99 % 04/14/24 0914  Vitals shown include unfiled device data.  Last Pain:  Vitals:   04/14/24 0911  TempSrc:   PainSc: 0-No pain      Patients Stated Pain Goal: 3 (04/14/24 1610)  Complications: No notable events documented.

## 2024-04-14 NOTE — Anesthesia Postprocedure Evaluation (Signed)
 Anesthesia Post Note  Patient: Alexander Bowen  Procedure(s) Performed: INSERTION, RADIATION SOURCE, PROSTATE (Prostate) INJECTION, HYDROGEL SPACER (Perineum)     Patient location during evaluation: PACU Anesthesia Type: General Level of consciousness: awake Pain management: pain level controlled Vital Signs Assessment: post-procedure vital signs reviewed and stable Respiratory status: spontaneous breathing, nonlabored ventilation and respiratory function stable Cardiovascular status: blood pressure returned to baseline and stable Postop Assessment: no apparent nausea or vomiting Anesthetic complications: no   No notable events documented.  Last Vitals:  Vitals:   04/14/24 0945 04/14/24 0949  BP: 130/65   Pulse: (!) 51 (!) 50  Resp: 19 18  Temp: (!) 36.4 C   SpO2: 95% 94%    Last Pain:  Vitals:   04/14/24 0930  TempSrc:   PainSc: 4                  Conard Decent

## 2024-04-14 NOTE — Progress Notes (Signed)
  Radiation Oncology         [phone] ________________________________  Name: Alexander Bowen MRN: [id number]  Date: 04/14/2024  DOB: 19-Nov-1950       Prostate Seed Implant  CC:[name], MD  No ref. provider found  DIAGNOSIS:  73 y.o. gentleman with Stage T1c adenocarcinoma of the prostate with Gleason score of 3+4, and PSA of 4.1.   Oncology History  Malignant neoplasm of prostate (HCC)  12/26/2023 Cancer Staging   Staging form: Prostate, AJCC 8th Edition - Clinical stage from 12/26/2023: Stage IIB (cT1c, cN0, cM0, PSA: 4.1, Grade Group: 2) - Signed by Keitha Pata, PA-C on 01/15/2024 Histopathologic type: Adenocarcinoma, NOS Stage prefix: Initial diagnosis Prostate specific antigen (PSA) range: Less than 10 Gleason primary pattern: 3 Gleason secondary pattern: 4 Gleason score: 7 Histologic grading system: 5 grade system Number of biopsy cores examined: 20 Number of biopsy cores positive: 11 Location of positive needle core biopsies: Both sides   01/15/2024 Initial Diagnosis   Malignant neoplasm of prostate (HCC)     No diagnosis found.  PROCEDURE: Insertion of radioactive I-125 seeds into the prostate gland.  RADIATION DOSE: 145 Gy, boost therapy.  TECHNIQUE: Alexander Bowen was brought to the operating room with the urologist. He was placed in the dorsolithotomy position. He was catheterized and a rectal tube was inserted. The perineum was shaved, prepped and draped. The ultrasound probe was then introduced by me into the rectum to see the prostate gland.  TREATMENT DEVICE: I attached the needle grid to the ultrasound probe stand and anchor needles were placed.  3D PLANNING: The prostate was imaged in 3D using a sagittal sweep of the prostate probe. These images were transferred to the planning computer. There, the prostate, urethra and rectum were defined on each axial reconstructed image. Then, the software created an optimized 3D plan and a few seed  positions were adjusted. The quality of the plan was reviewed using [facility] information for the target and the following two organs at risk:  Urethra and Rectum.  Then the accepted plan was printed and handed off to the radiation therapist.  Under my supervision, the custom loading of the seeds and spacers was carried out using the quick loader.  These pre-loaded needles were then placed into the needle holder.Aaron Aas  PROSTATE VOLUME STUDY:  Using transrectal ultrasound the volume of the prostate was verified to be 43.6 cc.  SPECIAL TREATMENT PROCEDURE/SUPERVISION AND HANDLING: The pre-loaded needles were then delivered by the urologist under sagittal guidance. A total of 16 needles were used to deposit 57 seeds in the prostate gland. The individual seed activity was 0.564 mCi.  SpaceOAR:  Yes  COMPLEX SIMULATION: At the end of the procedure, an anterior radiograph of the pelvis was obtained to document seed positioning and count. Cystoscopy was performed by the urologist to check the urethra and bladder.  MICRODOSIMETRY: At the end of the procedure, the patient was emitting 0.083 mR/hr at 1 meter. Accordingly, he was considered safe for hospital discharge.  PLAN: The patient will return to the radiation oncology clinic for post implant CT dosimetry in three weeks.   ________________________________  Trilby Fujisawa Lorri Rota, M.D.

## 2024-04-14 NOTE — Anesthesia Procedure Notes (Signed)
 Procedure Name: Intubation Date/Time: 04/14/2024 7:47 AM  Performed by: Mervyn Ace, CRNAPre-anesthesia Checklist: Patient identified, Emergency Drugs available, Suction available, Patient being monitored and Timeout performed Patient Re-evaluated:Patient Re-evaluated prior to induction Oxygen Delivery Method: Circle system utilized Preoxygenation: Pre-oxygenation with 100% oxygen Induction Type: IV induction Ventilation: Mask ventilation without difficulty Laryngoscope Size: Mac and 4 Grade View: Grade I Tube type: Oral Tube size: 8.0 mm Number of attempts: 1 Airway Equipment and Method: Stylet Placement Confirmation: ETT inserted through vocal cords under direct vision, positive ETCO2, CO2 detector and breath sounds checked- equal and bilateral Secured at: 24 cm Tube secured with: Tape (secured with pink Hy-tape) Dental Injury: Teeth and Oropharynx as per pre-operative assessment and Injury to lip  Comments: Left upper lip cut/ accidentally caught in laryngoscope, small amount of blood on upper left lip

## 2024-04-14 NOTE — H&P (Signed)
 Office Visit Report     04/07/2024   --------------------------------------------------------------------------------   Alexander Bowen  MRN: [id number]  DOB: [birth year], 73 year old Male  SSN:    PRIMARY CARE:  Francisco M. Arlena Lacrosse, MD  PRIMARY CARE FAX:  [phone]  REFERRING:  Aloysius Janus. Sherrine Dolly, MD  PROVIDER:  Yevonne Heman, M.D.  TREATING:  [place], [place]  LOCATION:  [facility] [phone]     --------------------------------------------------------------------------------   CC/HPI: Pt presents today for pre-operative history and physical exam in anticipation of space oar and brachytherapy by Dr. Sherrine Dolly on 04/14/24. He is doing well and is without complaint.    Pt denies F/C, HA, CP, SOB, N/V, diarrhea/constipation, back pain, flank pain, hematuria, and dysuria.    HX:   Prostate cancer   HPI: Alexander Bowen is a 73 year old male with T1c, grade 2 prostate cancer. Hx of Parkinson's disease, a-fib (on eliquis ).   Last PSA: 4.1 (2024)-- His PSA has hovered around 4.0 for the past year. Family history of prostate cancer involving his dad and brother.  Prostate volume: 55 cm   12/26/2023: The patient is here today for a prostate biopsy after he was found to have two PI-RADS 5 lesions involving the peripheral zone of the prostate.   01/02/24: The patient presents back today following his prostate biopsy, which revealed multifocal grade 2 prostate cancer. He is recovered well from his biopsy denies residual hematuria, dysuria or blood per rectum.   SHIM: 1  AUASS: 5/1     ALLERGIES: None   MEDICATIONS: levoFLOXacin 750 MG Tablet 1 tablet PO As Directed Take one hour prior to your scheduled procedure.  Omeprazole   Carvedilol  3.125 MG Tablet  Eliquis  5 MG Tablet tablet BID  Gabapentin 300 MG Capsule  Ibuprofen  Losartan  Potassium 50 MG Tablet  rOPINIRole  HCl 3 MG Tablet  Zocor 40 MG Tablet     Notes: Sinamet 200 mg TID  Magnesium   800 mg QD   GU PSH: Hydrocele repair Prostate Needle Biopsy - 12/26/2023       PSH Notes: fractured patella   NON-GU PSH: Appendectomy Cholecystectomy (laparoscopic) Surgical Pathology, Gross And Microscopic Examination For Prostate Needle - 12/26/2023 Tonsillectomy     GU PMH: Family Hx of Prostate Cancer - 01/02/2024, - 09/26/2023 Prostate Cancer - 01/02/2024 Elevated PSA - 12/26/2023, - 09/26/2023 Other male ED - 09/26/2023    NON-GU PMH: Arrhythmia Atrial Fibrillation GERD Hypercholesterolemia Hypertension Inflammatory liver disease, unspecified Parkinson's disease without dyskinesia, without mention of fluctuations    FAMILY HISTORY: Heart Disease - Mother Kidney Stones - Father prostate cancer in father - Brother TB (tuberculous cystitis) - Father   SOCIAL HISTORY: Marital Status: Married Preferred Language: English; Ethnicity: Not Hispanic Or Latino; Race: White Current Smoking Status: Patient does not smoke anymore.   Tobacco Use Assessment Completed: Used Tobacco in last 30 days? Does not use smokeless tobacco. Has never drank.  Does not use drugs. Does not drink caffeine. Has not had a blood transfusion.     Notes: Quit smoking in 1990    REVIEW OF SYSTEMS:    GU Review Male:   Patient denies frequent urination, hard to postpone urination, burning/ pain with urination, get up at night to urinate, leakage of urine, stream starts and stops, trouble starting your stream, have to strain to urinate , erection problems, and penile pain.  Gastrointestinal (Upper):   Patient denies nausea, vomiting, and indigestion/ heartburn.  Gastrointestinal (Lower):   Patient reports  constipation. Patient denies diarrhea.  Constitutional:   Patient denies fever, night sweats, weight loss, and fatigue.  Skin:   Patient denies skin rash/ lesion and itching.  Eyes:   Patient denies blurred vision and double vision.  Ears/ Nose/ Throat:   Patient denies sore throat and sinus  problems.  Hematologic/Lymphatic:   Patient denies swollen glands and easy bruising.  Cardiovascular:   Patient denies leg swelling and chest pains.  Respiratory:   Patient denies cough and shortness of breath.  Endocrine:   Patient denies excessive thirst.  Musculoskeletal:   Patient denies back pain and joint pain.  Neurological:   Patient denies dizziness and headaches.  Psychologic:   Patient denies depression and anxiety.   VITAL SIGNS:      04/07/2024 12:57 PM  Weight 180 lb / 81.65 kg  Height 70 in / 177.8 cm  BP 148/72 mmHg  Pulse 74 /min  Temperature 97.1 F / 36.1 C  BMI 25.8 kg/m   MULTI-SYSTEM PHYSICAL EXAMINATION:    Constitutional: Well-nourished. No physical deformities. Normally developed. Good grooming.  Neck: Neck symmetrical, not swollen. Normal tracheal position.  Respiratory: Normal breath sounds. No labored breathing, no use of accessory muscles.   Cardiovascular: Regular rate and rhythm. No murmur, no gallop.   Lymphatic: No enlargement of neck, axillae, groin.  Skin: No paleness, no jaundice, no cyanosis. No lesion, no ulcer, no rash.  Neurologic / Psychiatric: Oriented to time, oriented to place, oriented to person. No depression, no anxiety, no agitation.  Gastrointestinal: No mass, no tenderness, no rigidity, non obese abdomen.  Eyes: Normal conjunctivae. Normal eyelids.  Ears, Nose, Mouth, and Throat: Left ear no scars, no lesions, no masses. Right ear no scars, no lesions, no masses. Nose no scars, no lesions, no masses. Normal hearing. Normal lips.  Musculoskeletal: Normal gait and station of head and neck.     Complexity of Data:  Records Review:   Previous Patient Records  Urine Test Review:   Urinalysis   04/07/24  Urinalysis  Urine Appearance Clear   Urine Color Yellow   Urine Glucose Neg mg/dL  Urine Bilirubin Neg mg/dL  Urine Ketones Neg mg/dL  Urine Specific Gravity 1.025   Urine Blood Neg ery/uL  Urine pH <=5.0   Urine Protein Neg  mg/dL  Urine Urobilinogen 1.0 mg/dL  Urine Nitrites Neg   Urine Leukocyte Esterase Neg leu/uL   PROCEDURES:          Urinalysis - 81003 Dipstick Dipstick Cont'd  Color: Yellow Bilirubin: Neg mg/dL  Appearance: Clear Ketones: Neg mg/dL  Specific Gravity: 9.562 Blood: Neg ery/uL  pH: <=5.0 Protein: Neg mg/dL  Glucose: Neg mg/dL Urobilinogen: 1.0 mg/dL    Nitrites: Neg    Leukocyte Esterase: Neg leu/uL    ASSESSMENT:      ICD-10 Details  1 GU:   Prostate Cancer - C61    PLAN:           Schedule Return Visit/Planned Activity: Keep Scheduled Appointment - Schedule Surgery          Document Letter(s):  Created for Patient: Clinical Summary         Notes:   There are no changes in the patients history or physical exam since last evaluation by Dr. Sherrine Dolly. Pt is scheduled to undergo space oar and brachytherapy on 04/14/24.   All pt's questions were answered to the best of my ability.    -The patient was counseled about the natural history of prostate cancer and  the standard treatment options that are available for prostate cancer. It was explained to him how his age and life expectancy, clinical stage, Gleason score, and PSA affect his prognosis, the decision to proceed with additional staging studies, as well as how that information influences recommended treatment strategies. We discussed the roles for active surveillance, radiation therapy, surgical therapy, androgen deprivation, as well as ablative therapy options for the treatment of prostate cancer as appropriate to his individual cancer situation. We discussed the risks and benefits of these options with regard to their impact on cancer control and also in terms of potential adverse events, complications, and impact on quality of life particularly related to urinary and sexual function. The patient was encouraged to ask questions throughout the discussion today and all questions were answered to his stated satisfaction. In addition,  the patient was provided with and/or directed to appropriate resources and literature for further education about prostate cancer and treatment options.   The patient has decided to proceed with cystoscopy, brachytherapy seed and SpaceOAR placement as primary treatment of his risk prostate cancer.  The risks, benefits and alternatives of the aforementioned procedures was discussed in detail.  Risks include, bur are not limited to worsening LUTS, erectile dysfunction, rectal irritation, urethral stricture formation, fistula formation, cancer recurrence, MI, CVA, PE, DVT and the inherent risk of general anesthesia.  He voices understanding and wishes to proceed.

## 2024-04-14 NOTE — Op Note (Signed)
 PATIENT:  Alexander Bowen  PRE-OPERATIVE DIAGNOSIS:  Adenocarcinoma of the prostate  POST-OPERATIVE DIAGNOSIS:  Same  PROCEDURE:  1. I-125 radioactive seed implantation 2. Cystoscopy  3. Placement of SpaceOAR  SURGEON:  Yevonne Heman, MD  Radiation oncologist: Kenith Payer, MD  ANESTHESIA:  General  EBL:  Minimal  DRAINS: None  INDICATION: Alexander Bowen is a 73 year old male with grade 2 prostate cancer.  He is here today for brachytherapy seed and SpaceOAR placement as primary treatment of his prostate cancer. He has been consented for the above procedures, voices understanding and wishes to proceed.   Description of procedure: After informed consent the patient was brought to the major OR, placed on the table and administered general anesthesia. He was then moved to the modified lithotomy position with his perineum perpendicular to the floor. His perineum and genitalia were then sterilely prepped. An official timeout was then performed. A 16 French Foley catheter was then placed in the bladder and filled with dilute contrast, a rectal tube was placed in the rectum and the transrectal ultrasound probe was placed in the rectum and affixed to the stand. He was then sterilely draped.  Real time ultrasonography was used along with the seed planning software. This was used to develop the seed plan including the number of needles as well as number of seeds required for complete and adequate coverage. Real-time ultrasonography was then used along with the previously developed plan and the Nucletron device to implant a total of 57 seeds using 16 needles. This proceeded without difficulty or complication.   I then proceeded with placement of SpaceOAR by introducing a needle with the bevel angled inferiorly approximately 2 cm superior to the anus. This was angled downward and under direct ultrasound was placed within the space between the prostatic capsule and rectum. This was confirmed  with a small amount of sterile saline injected and this was performed under direct ultrasound. I then attached the SpaceOAR to the needle and injected this in the space between the prostate and rectum with good placement noted.  A Foley catheter was then removed as well as the transrectal ultrasound probe and rectal probe. Flexible cystoscopy was then performed using the 16 French flexible scope which revealed a normal urethra throughout its length down to the sphincter which appeared intact. The prostatic urethra revealed bilobar hypertrophy but no evidence of obstruction, seeds, spacers or lesions. The bladder was then entered and fully and systematically inspected. The ureteral orifices were noted to be of normal configuration and position. The mucosa revealed no evidence of tumors. There were also no stones identified within the bladder. I noted no seeds or spacers on the floor of the bladder and retroflexion of the scope revealed no seeds protruding from the base of the prostate.  The cystoscope was then removed and the patient was awakened and taken to recovery room in stable and satisfactory condition. He tolerated procedure well and there were no intraoperative complications.  Plan: discharge home

## 2024-04-15 ENCOUNTER — Encounter (HOSPITAL_COMMUNITY): Payer: Self-pay | Admitting: Urology

## 2024-04-17 NOTE — Progress Notes (Signed)
 Patient was a RadOnc Consult on 01/16/24 for his stage T1c adenocarcinoma of the prostate with Gleason score of 3+4, and PSA of 4.1.  Patient proceed with treatment recommendations of brachytherapy and had his treatment on 04/14/24.   Patient is scheduled for a post seed CT Sim/MRI on 6/25 and post op urology on 7/15.

## 2024-04-22 ENCOUNTER — Other Ambulatory Visit: Payer: Self-pay | Admitting: Cardiology

## 2024-04-22 DIAGNOSIS — I480 Paroxysmal atrial fibrillation: Secondary | ICD-10-CM

## 2024-04-22 NOTE — Telephone Encounter (Signed)
 Eliquis  mg refill request received. Patient is 73 years old, weight-81kg, Crea-0.82 on 04/01/24, Diagnosis-Afib, and last seen by Dr. Gordan Latina on 02/07/24. Dose is appropriate based on dosing criteria. Will send in refill to requested pharmacy.

## 2024-04-25 ENCOUNTER — Other Ambulatory Visit: Payer: Self-pay | Admitting: Cardiology

## 2024-04-27 ENCOUNTER — Ambulatory Visit: Payer: Self-pay | Admitting: Cardiology

## 2024-04-30 ENCOUNTER — Telehealth: Payer: Self-pay

## 2024-04-30 DIAGNOSIS — M79606 Pain in leg, unspecified: Secondary | ICD-10-CM

## 2024-04-30 NOTE — Telephone Encounter (Signed)
 Lower Ext Doppler Study Results reviewed with pt as per Dr. Tonja Fray note.  Pt verbalized understanding and had no additional questions. Routed to PCP

## 2024-04-30 NOTE — Telephone Encounter (Signed)
 Lower Ext Doppler Study Results reviewed with pt as per Dr. Tonja Fray note.  Pt verbalized understanding and had no additional questions. Referral made to Dr. Katheryne Pane Routed to PCP

## 2024-05-01 NOTE — Progress Notes (Signed)
 Post-seed nursing interview for a diagnosis of Stage T1c adenocarcinoma of the prostate with Gleason score of 3+4, and PSA of 4.1.   Patient identity verified x2.   Patient states issues as follows...  -Pain: *** -Fatigue: *** -Abdomen: *** -Groin: *** -Urinary: *** -Bowels: *** -Appetite: ***  Patient denies all other related issues at this time.  Meaningful use complete.  I-PSS (AUA) score- *** - {(BH) RANGE ABSENT/SEVERE:20013:s} SHIM (ED) score- *** Urinary Management medication(s) *** Urology appointment date- *** with Dr. Sherrine Dolly at [facility]- ***  This concludes the interaction.   Avery Bodo, LPN

## 2024-05-05 ENCOUNTER — Telehealth: Payer: Self-pay | Admitting: *Deleted

## 2024-05-05 NOTE — Telephone Encounter (Signed)
 CALLED PATIENT TO REMIND OF POST SEED APPTS. AND MRI FOR 05-06-24, SPOKE WITH PATIENT AND HE IS AWARE OF THESE APPTS.

## 2024-05-06 ENCOUNTER — Ambulatory Visit
Admission: RE | Admit: 2024-05-06 | Discharge: 2024-05-06 | Disposition: A | Discharge status: Home / Self Care | Payer: Self-pay | Source: Ambulatory Visit | Attending: Urology | Admitting: Urology

## 2024-05-06 ENCOUNTER — Ambulatory Visit
Admission: RE | Admit: 2024-05-06 | Discharge: 2024-05-06 | Disposition: A | Discharge status: Home / Self Care | Source: Ambulatory Visit | Attending: Radiation Oncology | Admitting: Radiation Oncology

## 2024-05-06 ENCOUNTER — Encounter: Payer: Self-pay | Admitting: Urology

## 2024-05-06 ENCOUNTER — Ambulatory Visit (HOSPITAL_COMMUNITY)
Admission: RE | Admit: 2024-05-06 | Discharge: 2024-05-06 | Disposition: A | Discharge status: Home / Self Care | Source: Ambulatory Visit | Attending: Urology | Admitting: Urology

## 2024-05-06 VITALS — BP 121/61 | HR 56 | Temp 97.8°F | Resp 18 | Ht 70.0 in | Wt 180.2 lb

## 2024-05-06 DIAGNOSIS — C61 Malignant neoplasm of prostate: Secondary | ICD-10-CM | POA: Insufficient documentation

## 2024-05-06 NOTE — Progress Notes (Signed)
 Radiation Oncology         [phone] ________________________________  Name: Alexander Bowen MRN: [id number]  Date: 05/06/2024  DOB: [birth year]  Post-Seed Follow-Up Visit Note  CC: Trinidad Glisson, MD  Devere Lonni Fire*  Diagnosis:   73 y.o. gentleman with Stage T1c adenocarcinoma of the prostate with Gleason score of 3+4, and PSA of 4.1     ICD-10-CM   1. Malignant neoplasm of prostate (HCC)  C61       Interval Since Last Radiation:  3 weeks 04/14/24:  Insertion of radioactive I-125 seeds into the prostate gland; 145 Gy, definitive therapy with placement of SpaceOAR gel.  Narrative:  The patient returns today for routine follow-up.  He is complaining of increased urinary frequency and urinary hesitation symptoms. He filled out a questionnaire regarding urinary function today providing and overall IPSS score of 13 characterizing his symptoms as moderate but tolerable.  He specifically denies dysuria, gross hematuria, straining to void or incontinence.  His pre-implant score was 9. He denies any abdominal pain or bowel symptoms.  Reports a healthy appetite instantaneously.  He has good energy and overall he is quite pleased with his progress today.  ALLERGIES:  has no known allergies.  Meds: Current Outpatient Medications  Medication Sig Dispense Refill   carbidopa-levodopa (SINEMET IR) 25-100 MG tablet Take 1 tablet by mouth 3 (three) times daily. HAVE NOT STARTED YET, TRANSITIONING FROM REQUIP      carvedilol  (COREG ) 3.125 MG tablet TAKE 1 TABLET(3.125 MG) BY MOUTH TWICE DAILY WITH A MEAL 180 tablet 2   diltiazem (CARDIZEM) 30 MG tablet Take 30 mg by mouth as needed (Afib sx's). Take if heart rate goes over 120     ELIQUIS  5 MG TABS tablet TAKE 1 TABLET(5 MG) BY MOUTH TWICE DAILY 180 tablet 1   gabapentin (NEURONTIN) 300 MG capsule Take 300 mg by mouth 2 (two) times daily as needed (back pain .).     ibuprofen (ADVIL) 200 MG tablet Take 800 mg by mouth daily as needed  (back pain).     losartan  (COZAAR ) 50 MG tablet TAKE 1 TABLET(50 MG) BY MOUTH DAILY (Patient taking differently: Take 50 mg by mouth every evening.) 90 tablet 2   MAGNESIUM  GLYCINATE PO Take 400 mg by mouth 2 (two) times daily.     omeprazole  (PRILOSEC) 40 MG capsule Take 40 mg by mouth daily as needed (indigestion/heartburn).     polyethylene glycol (MIRALAX / GLYCOLAX) 17 g packet Take 17 g by mouth every other day.     rOPINIRole  (REQUIP ) 3 MG tablet Take 1 tablet (3 mg total) by mouth 4 (four) times daily. (Patient taking differently: Take 3 mg by mouth at bedtime.) 360 tablet 3   simvastatin (ZOCOR) 40 MG tablet Take 40 mg by mouth every evening.     No current facility-administered medications for this encounter.    Physical Findings: In general this is a well appearing Caucasian male in no acute distress. He's alert and oriented x4 and appropriate throughout the examination. Cardiopulmonary assessment is negative for acute distress and he exhibits normal effort.   Lab Findings: Lab Results  Component Value Date   WBC 9.8 04/01/2024   HGB 15.0 04/01/2024   HCT 47.2 04/01/2024   MCV 95.4 04/01/2024   PLT 215 04/01/2024    Radiographic Findings:  Patient underwent CT imaging in our clinic for post implant dosimetry. The CT will be fused with his prostate MRI that will be performed today at 10:30am and  will be reviewed by Dr. [name] to confirm there is an adequate distribution of radioactive seeds throughout the prostate gland and ensure that there are no seeds in or near the rectum. We suspect the final radiation plan and dosimetry will show appropriate coverage of the prostate gland. He understands that we will call and inform him of any unexpected findings on further review of his imaging and dosimetry.  Impression/Plan: 73 y.o. gentleman with Stage T1c adenocarcinoma of the prostate with Gleason score of 3+4, and PSA of 4.1  The patient is recovering from the effects of radiation.  His urinary symptoms should gradually improve over the next 4-6 months. We talked about this today. He is encouraged by his improvement already and is otherwise pleased with his outcome. We also talked about long-term follow-up for prostate cancer following seed implant. He understands that ongoing PSA determinations and digital rectal exams will help perform surveillance to rule out disease recurrence. He has a follow up appointment scheduled with Sherlyn Moats, NP on 05/26/24. He understands what to expect with his PSA measures. Patient was also educated today about some of the long-term effects from radiation including a small risk for rectal bleeding and possibly erectile dysfunction. We talked about some of the general management approaches to these potential complications. However, I did encourage the patient to contact our office or return at any point if he has questions or concerns related to his previous radiation and prostate cancer.    Sabra MICAEL Rusk, PA-C

## 2024-05-06 NOTE — Progress Notes (Signed)
  Radiation Oncology         [phone] ________________________________  Name: Alexander Bowen MRN: [id number]  Date: 05/06/2024  DOB: Jun 09, 1951  COMPLEX SIMULATION NOTE  NARRATIVE:  The patient was brought to the CT Simulation planning suite today following prostate seed implantation approximately one month ago.  Identity was confirmed.  All relevant records and images related to the planned course of therapy were reviewed.  Then, the patient was set-up supine.  CT images were obtained.  The CT images were loaded into the planning software.  Then the prostate and rectum were contoured.  Treatment planning then occurred.  The implanted iodine 125 seeds were identified by the physics staff for projection of radiation distribution  I have requested : 3D Simulation  I have requested a DVH of the following structures: Prostate and rectum.    ________________________________  [name] [name], M.D.

## 2024-05-11 DIAGNOSIS — C61 Malignant neoplasm of prostate: Secondary | ICD-10-CM | POA: Diagnosis not present

## 2024-05-11 NOTE — Radiation Completion Notes (Signed)
 Patient Name: Alexander Bowen, Alexander Bowen MRN: [id number] Date of Birth: Jun 16, 1951 Referring Physician: Lonni Han, M.D. Date of Service: 2024-05-11 Radiation Oncologist: Adina Barge, M.D. [facility]                             RADIATION ONCOLOGY END OF TREATMENT NOTE     Diagnosis: C61 Malignant neoplasm of prostate Staging on 2023-12-26: Malignant neoplasm of prostate (HCC) T=cT1c, N=cN0, M=cM0 Intent: Curative     ==========DELIVERED PLANS==========  Prostate Seed Implant Date: 2024-04-14   Plan Name: Prostate Seed Implant Site: Prostate Technique: Radioactive Seed Implant I-125 Mode: Brachytherapy Dose Per Fraction: 145 Gy Prescribed Dose (Delivered / Prescribed): 145 Gy / 145 Gy Prescribed Fxs (Delivered / Prescribed): 1 / 1     ==========ON TREATMENT VISIT DATES========== 2024-04-14     ==========UPCOMING VISITS========== 05/12/2024 [facility] Magda Debby SAILOR, MD

## 2024-05-12 ENCOUNTER — Encounter: Payer: Self-pay | Admitting: Radiation Oncology

## 2024-05-12 ENCOUNTER — Encounter: Payer: Self-pay | Admitting: Vascular Surgery

## 2024-05-12 ENCOUNTER — Ambulatory Visit: Attending: Vascular Surgery | Admitting: Vascular Surgery

## 2024-05-12 VITALS — BP 108/67 | HR 52 | Temp 98.1°F | Resp 18 | Ht 70.0 in | Wt 179.0 lb

## 2024-05-12 DIAGNOSIS — I739 Peripheral vascular disease, unspecified: Secondary | ICD-10-CM

## 2024-05-12 NOTE — Progress Notes (Signed)
  Radiation Oncology         [phone] ________________________________  Name: Alexander Bowen MRN: [id number]  Date: 05/11/2024  DOB: [birth year]  3D Planning Note   Prostate Brachytherapy Post-Implant Dosimetry  Diagnosis: 73 y.o. gentleman with Stage T1c adenocarcinoma of the prostate with Gleason score of 3+4, and PSA of 4.1.   Narrative: On a previous date, Alexander Bowen returned following prostate seed implantation for post implant planning. He underwent CT scan complex simulation to delineate the three-dimensional structures of the pelvis and demonstrate the radiation distribution.  Since that time, the seed localization, and complex isodose planning with dose volume histograms have now been completed.  Results:   Prostate Coverage - The dose of radiation delivered to the 90% or more of the prostate gland (D90) was 120.3% of the prescription dose. This exceeds our goal of greater than 90%. Rectal Sparing - The volume of rectal tissue receiving the prescription dose or higher was 0.0 cc. This falls under our thresholds tolerance of 1.0 cc.  Impression: The prostate seed implant appears to show adequate target coverage and appropriate rectal sparing.  Plan:  The patient will continue to follow with urology for ongoing PSA determinations. I would anticipate a high likelihood for local tumor control with minimal risk for rectal morbidity.  ________________________________  [name] [name], M.D.

## 2024-05-12 NOTE — Progress Notes (Signed)
 [facility] [place]  ASSESSMENT / PLAN: Alexander Bowen is a 73 y.o. male with atherosclerosis of native arteries of bilateral lower extremities causing no symptoms.  Patient counseled patients with asymptomatic peripheral arterial disease or claudication have a 1-2% risk of developing chronic limb threatening ischemia, but a 15-30% risk of mortality in the next 5 years. Intervention should only be considered for medically optimized patients with disabling symptoms.   Recommend:  Abstinence from all tobacco products. Blood glucose control with goal A1c < 7%. Blood pressure control with goal blood pressure < 130/80 mmHg. Lipid reduction therapy with goal LDL-C < 55 mg/dL. Aspirin  81mg  by mouth daily. Atorvastatin 40-80mg  PO QD (or other high intensity statin therapy).  Follow up as needed should he develop symptomatic peripheral arterial disease.  CHIEF COMPLAINT: atherosclerosis identified on MRI  HISTORY OF PRESENT ILLNESS: Alexander Bowen is a 73 y.o. male referred to clinic for incidental discovery of atherosclerosis during MRI for workup of prostate cancer.  The patient is asymptomatic from peripheral arterial disease standpoint.  He is a fairly active gentleman, who participates in parkinsonism physical therapy.  He previously enjoyed walking and doing other types of exercises, but reports doing less of these in the past 5 years.  He is a non-smoker.  He has regular follow-up with his primary care physician and cardiologist.  He is on Eliquis  for A-fib.  He denies any symptoms typical of intermittent claudication, ischemic rest pain, or ischemic ulceration.   Past Medical History:  Diagnosis Date   Atrial fibrillation [facility])    Atrial flutter (HCC) 09/27/2016   Overview:  Chads score 0, only an aspirin   Formatting of this note might be different from the original. Chads score 0, only an aspirin    Atypical chest pain 06/30/2018   Cholelithiasis 02/17/2019    Drug-induced erectile dysfunction 12/28/2016   Dyslipidemia 06/11/2017   Dyspnea on exertion 11/20/2019   Dysrhythmia 2001   palpitations -checked out by Cardiologist   GERD (gastroesophageal reflux disease)    Hepatitis    A   High cholesterol    History of kidney stones    Hypertension    Lumbar radiculopathy 06/08/2021   Major neurocognitive disorder due to Parkinson's disease, possible 08/24/2019   PAC (premature atrial contraction) 05/19/2020   PAF (paroxysmal atrial fibrillation) (HCC) 12/24/2019   Palpitations 09/27/2016   Parkinson disease (HCC) 09/19/2020   Patella fracture 08/05/2015   PONV (postoperative nausea and vomiting)    PVC (premature ventricular contraction) 11/20/2019   Supraventricular tachycardia (HCC) 09/27/2016   Tinnitus 12/28/2019    Past Surgical History:  Procedure Laterality Date   APPENDECTOMY     CHOLECYSTECTOMY     HYDROCELE EXCISION / REPAIR Right    LITHOTRIPSY     x 2   ORIF PATELLA Left 08/05/2015   Procedure: OPEN REDUCTION INTERNAL (ORIF) LEFT  FIXATION PATELLA;  Surgeon: Reyes Billing, MD;  Location: [facility];  Service: Orthopedics;  Laterality: Left;   RADIOACTIVE SEED IMPLANT N/A 04/14/2024   Procedure: INSERTION, RADIATION SOURCE, PROSTATE;  Surgeon: Devere Lonni Righter, MD;  Location: [facility];  Service: Urology;  Laterality: N/A;  RADIOACTIVE SEED IMPLANT AND SPACE OAR   SPACE OAR INSTILLATION N/A 04/14/2024   Procedure: INJECTION, HYDROGEL SPACER;  Surgeon: Devere Lonni Righter, MD;  Location: [facility];  Service: Urology;  Laterality: N/A;   STONE EXTRACTION WITH BASKET     x2   TONSILLECTOMY      Family History  Problem  Relation Age of Onset   Heart disease Mother    Heart failure Mother    Diabetes Father    Prostate cancer Father    Dementia Father     Social History   Socioeconomic History   Marital status: Married    Spouse name: Not on file   Number of children: Not on file   Years of education: Not on  file   Highest education level: Not on file  Occupational History   Not on file  Tobacco Use   Smoking status: Former    Current packs/day: 0.00    Average packs/day: 1.5 packs/day for 20.0 years (30.0 ttl pk-yrs)    Types: Cigarettes    Start date: 03/23/1969    Quit date: 03/23/1989    Years since quitting: 35.1   Smokeless tobacco: Never  Vaping Use   Vaping status: Never Used  Substance and Sexual Activity   Alcohol  use: Not Currently    Comment: none in last 2 years   Drug use: No   Sexual activity: Not on file  Other Topics Concern   Not on file  Social History Narrative   Right handed   Caffeine: none   Lives at home with wife   Social Drivers of Corporate investment banker Strain: Not on file  Food Insecurity: No Food Insecurity (05/06/2024)   Hunger Vital Sign    Worried About Running Out of Food in the Last Year: Never true    Ran Out of Food in the Last Year: Never true  Transportation Needs: No Transportation Needs (05/06/2024)   PRAPARE - Administrator, Civil Service (Medical): No    Lack of Transportation (Non-Medical): No  Physical Activity: Not on file  Stress: Not on file  Social Connections: Not on file  Intimate Partner Violence: Not At Risk (05/06/2024)   Humiliation, Afraid, Rape, and Kick questionnaire    Fear of Current or Ex-Partner: No    Emotionally Abused: No    Physically Abused: No    Sexually Abused: No    No Known Allergies  Current Outpatient Medications  Medication Sig Dispense Refill   carbidopa-levodopa (SINEMET IR) 25-100 MG tablet Take 1 tablet by mouth 3 (three) times daily. HAVE NOT STARTED YET, TRANSITIONING FROM REQUIP      carvedilol  (COREG ) 3.125 MG tablet TAKE 1 TABLET(3.125 MG) BY MOUTH TWICE DAILY WITH A MEAL 180 tablet 2   diltiazem (CARDIZEM) 30 MG tablet Take 30 mg by mouth as needed (Afib sx's). Take if heart rate goes over 120     ELIQUIS  5 MG TABS tablet TAKE 1 TABLET(5 MG) BY MOUTH TWICE DAILY 180  tablet 1   gabapentin (NEURONTIN) 300 MG capsule Take 300 mg by mouth 2 (two) times daily as needed (back pain .).     ibuprofen (ADVIL) 200 MG tablet Take 800 mg by mouth daily as needed (back pain).     losartan  (COZAAR ) 50 MG tablet TAKE 1 TABLET(50 MG) BY MOUTH DAILY (Patient taking differently: Take 50 mg by mouth every evening.) 90 tablet 2   MAGNESIUM  GLYCINATE PO Take 400 mg by mouth 2 (two) times daily.     omeprazole  (PRILOSEC) 40 MG capsule Take 40 mg by mouth daily as needed (indigestion/heartburn).     polyethylene glycol (MIRALAX / GLYCOLAX) 17 g packet Take 17 g by mouth every other day.     rOPINIRole  (REQUIP ) 3 MG tablet Take 1 tablet (3 mg total) by mouth 4 (four)  times daily. (Patient taking differently: Take 3 mg by mouth at bedtime.) 360 tablet 3   simvastatin (ZOCOR) 40 MG tablet Take 40 mg by mouth every evening.     No current facility-administered medications for this visit.    PHYSICAL EXAM Vitals:   05/12/24 1100  BP: 108/67  Pulse: (!) 52  Resp: 18  Temp: 98.1 F (36.7 C)  TempSrc: Temporal  SpO2: 96%  Weight: 179 lb (81.2 kg)  Height: 5' 10 (1.778 m)    Well-appearing elderly man in no distress Regular rate and rhythm Unlabored breathing No palpable pedal pulses Feet are warm and well-perfused No ischemic ulcers  PERTINENT LABORATORY AND RADIOLOGIC DATA  Most recent CBC    Latest Ref Rng & Units 04/01/2024    8:53 AM 08/04/2015   11:35 AM  CBC  WBC 4.0 - 10.5 K/uL 9.8  8.6   Hemoglobin 13.0 - 17.0 g/dL 84.9  84.6   Hematocrit 39.0 - 52.0 % 47.2  43.4   Platelets 150 - 400 K/uL 215  266      Most recent CMP    Latest Ref Rng & Units 04/01/2024    8:53 AM 10/28/2019    1:46 PM 10/01/2019    2:29 PM  CMP  Glucose 70 - 99 mg/dL 71  84  860   BUN 8 - 23 mg/dL 22  14  15    Creatinine 0.61 - 1.24 mg/dL 9.17  8.89  8.90   Sodium 135 - 145 mmol/L 139  140  142   Potassium 3.5 - 5.1 mmol/L 4.0  5.3  4.4   Chloride 98 - 111 mmol/L 106  103   103   CO2 22 - 32 mmol/L 25  26  23    Calcium 8.9 - 10.3 mg/dL 9.2  89.9  9.1    +-------+-----------+-----------+------------+------------+  ABI/TBIToday's ABIToday's TBIPrevious ABIPrevious TBI  +-------+-----------+-----------+------------+------------+  Right .83        .52        .57         .38           +-------+-----------+-----------+------------+------------+  Left  .74        .43        .72         .44           +-------+-----------+-----------+------------+------------+   Bilateral arterial duplex: Right: Extensive bulky calcified plaque throughout.  30-49% stenosis in the mid CFA.  75-99% stenosis in the mid/distal SFA, high end range, based on VR 5.8.  50-74% stenosis in the mid TPT.   Left: Extensive bulky calcified plaque throughout.  50-74% stenosis in the mid SFA.  30-49% stenosis in the distal CFA, high end range.  50-74% stenosis in the DFA and ostial SFA.   Debby SAILOR. Magda, MD [facility] Phone Number: [phone] 05/12/2024 12:33 PM   Total time spent on preparing this encounter including chart review, data review, collecting history, examining the patient, and coordinating care: 45 minutes  Portions of this report may have been transcribed using voice recognition software.  Every effort has been made to ensure accuracy; however, inadvertent computerized transcription errors may still be present.

## 2024-06-11 ENCOUNTER — Encounter: Payer: Self-pay | Admitting: *Deleted

## 2024-07-16 ENCOUNTER — Encounter: Payer: Self-pay | Admitting: *Deleted

## 2024-07-21 ENCOUNTER — Encounter: Payer: Self-pay | Admitting: *Deleted

## 2024-08-06 ENCOUNTER — Inpatient Hospital Stay: Admitting: *Deleted

## 2024-08-10 ENCOUNTER — Encounter: Payer: Self-pay | Admitting: *Deleted

## 2024-08-10 ENCOUNTER — Inpatient Hospital Stay: Attending: Adult Health | Admitting: *Deleted

## 2024-08-10 DIAGNOSIS — C61 Malignant neoplasm of prostate: Secondary | ICD-10-CM

## 2024-08-10 NOTE — Progress Notes (Signed)
 SCP reviewed and completed.Allergies and meds reviewed and updated. Pt denies smoking and drinking. Pt denies pain today but does have fatigue rated a 4/10, but it doesn't interfere with his day to day activities. Pt is an exerciser and does scheduled PT because of his Parkinson's. Pt urinates more frequently during daytime hours every 30 mins to an hour. Pt only gets up once or twice at night. Last colonoscopy was 3 years ago. Vaccines were discussed and updated. Nutrition was discussed and encouraged pt to attend the virtual nutrition class. Pt said his urine flow varies, but he says he doesn't have any trouble completing voids. Pt will have post-tx PSA labs in October with Dr. Devere.

## 2024-08-14 ENCOUNTER — Encounter: Admitting: *Deleted

## 2024-08-25 ENCOUNTER — Ambulatory Visit: Attending: Cardiology | Admitting: Cardiology

## 2024-08-25 ENCOUNTER — Other Ambulatory Visit: Payer: Self-pay

## 2024-08-25 VITALS — BP 120/62 | HR 60 | Ht 70.0 in | Wt 182.4 lb

## 2024-08-25 DIAGNOSIS — I480 Paroxysmal atrial fibrillation: Secondary | ICD-10-CM

## 2024-08-25 DIAGNOSIS — I10 Essential (primary) hypertension: Secondary | ICD-10-CM

## 2024-08-25 DIAGNOSIS — G20B2 Parkinson's disease with dyskinesia, with fluctuations: Secondary | ICD-10-CM | POA: Diagnosis not present

## 2024-08-25 DIAGNOSIS — C61 Malignant neoplasm of prostate: Secondary | ICD-10-CM

## 2024-08-25 DIAGNOSIS — R0609 Other forms of dyspnea: Secondary | ICD-10-CM

## 2024-08-25 DIAGNOSIS — K759 Inflammatory liver disease, unspecified: Secondary | ICD-10-CM | POA: Insufficient documentation

## 2024-08-25 DIAGNOSIS — Z87442 Personal history of urinary calculi: Secondary | ICD-10-CM | POA: Insufficient documentation

## 2024-08-25 MED ORDER — DILTIAZEM HCL 30 MG PO TABS: 30.0000 mg | ORAL_TABLET | ORAL | 3 refills | Status: AC | PRN

## 2024-08-25 NOTE — Progress Notes (Signed)
 Cardiology Office Note:    Date:  08/25/2024   ID:  Alexander Bowen, DOB January 24, 1951, MRN [id number]  PCP:  Trinidad Glisson, MD  Cardiologist:  Lamar Fitch, MD    Referring MD: Trinidad Glisson, MD   No chief complaint on file. I had 2 episode of atrial fibrillation  History of Present Illness:    Alexander Bowen is a 73 y.o. male past medical history significant for paroxysmal atrial fibrillation, anticoagulated with Eliquis , dyslipidemia, parkinsonian, PVCs, PACs, peripheral vascular disease with inform of significant distal stenosis of the aorta.  Comes today to months for follow-up overall doing well still participate in boxing classes for his Parkinson's he said his medication has been adjusted so checks are not bad.  Overall he is doing well since I have seen him last time he reports 2 episodes of palpitations when his heart speeds up 1 episode lasted up to 48 hours.  He did not record EKG during this episode  Past Medical History:  Diagnosis Date   Atrial fibrillation (HCC)    Atrial flutter (HCC) 09/27/2016   Overview:  Chads score 0, only an aspirin   Formatting of this note might be different from the original. Chads score 0, only an aspirin    Cholelithiasis 02/17/2019   Drug-induced erectile dysfunction 12/28/2016   Dyspnea on exertion 11/20/2019   Dysrhythmia 2001   palpitations -checked out by Cardiologist   GERD (gastroesophageal reflux disease)    Hepatitis    A   High cholesterol    History of kidney stones    Hypertension    Low back pain 11/18/2023   Lumbar radiculopathy 06/08/2021   Major neurocognitive disorder possibly due to Parkinson's disease (HCC) 08/24/2019   IMO SNOMED Dx Update Oct 2024     Malignant neoplasm of prostate (HCC) 01/15/2024   PAC (premature atrial contraction) 05/19/2020   PAF (paroxysmal atrial fibrillation) (HCC) 12/24/2019   Palpitations 09/27/2016   Parkinson disease (HCC) 09/19/2020   Patella fracture 08/05/2015    PONV (postoperative nausea and vomiting)    Preop cardiovascular exam 10/16/2021   PVC (premature ventricular contraction) 11/20/2019   Supraventricular tachycardia 09/27/2016   Tinnitus 12/28/2019    Past Surgical History:  Procedure Laterality Date   APPENDECTOMY     CHOLECYSTECTOMY     HYDROCELE EXCISION / REPAIR Right    LITHOTRIPSY     x 2   ORIF PATELLA Left 08/05/2015   Procedure: OPEN REDUCTION INTERNAL (ORIF) LEFT  FIXATION PATELLA;  Surgeon: Reyes Billing, MD;  Location: [facility];  Service: Orthopedics;  Laterality: Left;   RADIOACTIVE SEED IMPLANT N/A 04/14/2024   Procedure: INSERTION, RADIATION SOURCE, PROSTATE;  Surgeon: Devere Lonni Righter, MD;  Location: [facility];  Service: Urology;  Laterality: N/A;  RADIOACTIVE SEED IMPLANT AND SPACE OAR   SPACE OAR INSTILLATION N/A 04/14/2024   Procedure: INJECTION, HYDROGEL SPACER;  Surgeon: Devere Lonni Righter, MD;  Location: [facility];  Service: Urology;  Laterality: N/A;   STONE EXTRACTION WITH BASKET     x2   TONSILLECTOMY      Current Medications: Current Meds  Medication Sig   carbidopa-levodopa (SINEMET IR) 25-100 MG tablet Take 1 tablet by mouth 3 (three) times daily. HAVE NOT STARTED YET, TRANSITIONING FROM REQUIP    carvedilol  (COREG ) 3.125 MG tablet TAKE 1 TABLET(3.125 MG) BY MOUTH TWICE DAILY WITH A MEAL   diltiazem (CARDIZEM) 30 MG tablet Take 30 mg by mouth as needed (Afib sx's). Take if heart rate goes over 120  ELIQUIS  5 MG TABS tablet TAKE 1 TABLET(5 MG) BY MOUTH TWICE DAILY   gabapentin (NEURONTIN) 300 MG capsule Take 300 mg by mouth 2 (two) times daily as needed (back pain .).   ibuprofen (ADVIL) 200 MG tablet Take 800 mg by mouth daily as needed (back pain).   losartan  (COZAAR ) 50 MG tablet TAKE 1 TABLET(50 MG) BY MOUTH DAILY   MAGNESIUM  GLYCINATE PO Take 400 mg by mouth 2 (two) times daily.   omeprazole  (PRILOSEC) 40 MG capsule Take 40 mg by mouth daily as needed (indigestion/heartburn).   polyethylene  glycol (MIRALAX / GLYCOLAX) 17 g packet Take 17 g by mouth every other day. (Patient taking differently: Take 17 g by mouth every other day. Pt takes couple time a week)   rOPINIRole  (REQUIP ) 3 MG tablet Take 1 tablet (3 mg total) by mouth 4 (four) times daily. (Patient taking differently: Take 3 mg by mouth at bedtime.)   simvastatin (ZOCOR) 40 MG tablet Take 40 mg by mouth every evening.     Allergies:   Patient has no known allergies.   Social History   Socioeconomic History   Marital status: Married    Spouse name: Not on file   Number of children: Not on file   Years of education: Not on file   Highest education level: Not on file  Occupational History   Not on file  Tobacco Use   Smoking status: Former    Current packs/day: 0.00    Average packs/day: 1.5 packs/day for 20.0 years (30.0 ttl pk-yrs)    Types: Cigarettes    Start date: 03/23/1969    Quit date: 03/23/1989    Years since quitting: 35.4   Smokeless tobacco: Never  Vaping Use   Vaping status: Never Used  Substance and Sexual Activity   Alcohol  use: Not Currently    Comment: none in last 2 years   Drug use: No   Sexual activity: Not on file  Other Topics Concern   Not on file  Social History Narrative   Right handed   Caffeine: none   Lives at home with wife   Social Drivers of Corporate investment banker Strain: Not on file  Food Insecurity: No Food Insecurity (05/06/2024)   Hunger Vital Sign    Worried About Running Out of Food in the Last Year: Never true    Ran Out of Food in the Last Year: Never true  Transportation Needs: No Transportation Needs (05/06/2024)   PRAPARE - Administrator, Civil Service (Medical): No    Lack of Transportation (Non-Medical): No  Physical Activity: Not on file  Stress: Not on file  Social Connections: Not on file     Family History: The patient's family history includes Dementia in his father; Diabetes in his father; Heart disease in his mother; Heart  failure in his mother; Prostate cancer in his father. ROS:   Please see the history of present illness.    All 14 point review of systems negative except as described per history of present illness  EKGs/Labs/Other Studies Reviewed:         Recent Labs: 04/01/2024: BUN 22; Creatinine, Ser 0.82; Hemoglobin 15.0; Platelets 215; Potassium 4.0; Sodium 139  Recent Lipid Panel No results found for: CHOL, TRIG, HDL, CHOLHDL, VLDL, LDLCALC, LDLDIRECT  Physical Exam:    VS:  BP 120/62   Pulse 60   Ht 5' 10 (1.778 m)   Wt 182 lb 6.4 oz (82.7 kg)  SpO2 96%   BMI 26.17 kg/m     Wt Readings from Last 3 Encounters:  08/25/24 182 lb 6.4 oz (82.7 kg)  05/12/24 179 lb (81.2 kg)  05/06/24 180 lb 3.2 oz (81.7 kg)     GEN:  Well nourished, well developed in no acute distress HEENT: Normal NECK: No JVD; No carotid bruits LYMPHATICS: No lymphadenopathy CARDIAC: RRR, no murmurs, no rubs, no gallops RESPIRATORY:  Clear to auscultation without rales, wheezing or rhonchi  ABDOMEN: Soft, non-tender, non-distended MUSCULOSKELETAL:  No edema; No deformity  SKIN: Warm and dry LOWER EXTREMITIES: no swelling NEUROLOGIC:  Alert and oriented x 3 PSYCHIATRIC:  Normal affect   ASSESSMENT:    1. Paroxysmal atrial fibrillation (HCC)   2. Primary hypertension   3. Parkinson's disease with dyskinesia and fluctuating manifestations (HCC)   4. Malignant neoplasm of prostate (HCC)    PLAN:    In order of problems listed above:  Paroxysmal atrial fibrillation.  Continue anticoagulation told him of episode become more frequent or more bothersome and he may need to let me know. Essential hypertension blood pressure well-controlled continue present management. He describes to be a little more winded.  Will schedule him to have echocardiogram. Dyslipidemia I did review KPN, LDL 60 HDL 42 this is from 04/15/2024 continue present management with simvastatin   Medication Adjustments/Labs and  Tests Ordered: Current medicines are reviewed at length with the patient today.  Concerns regarding medicines are outlined above.  No orders of the defined types were placed in this encounter.  Medication changes: No orders of the defined types were placed in this encounter.   Signed, Lamar DOROTHA Fitch, MD, [facility] 08/25/2024 2:15 PM    [facility]

## 2024-08-25 NOTE — Patient Instructions (Addendum)

## 2024-09-03 ENCOUNTER — Ambulatory Visit: Attending: Cardiology

## 2024-09-03 DIAGNOSIS — R0609 Other forms of dyspnea: Secondary | ICD-10-CM | POA: Diagnosis not present

## 2024-09-04 LAB — ECHOCARDIOGRAM COMPLETE
AR max vel: 2.17 cm2
AV Area VTI: 2.2 cm2
AV Area mean vel: 2.17 cm2
AV Mean grad: 7 mmHg
AV Peak grad: 14 mmHg
Ao pk vel: 1.87 m/s
Area-P 1/2: 3.66 cm2
S' Lateral: 3 cm

## 2024-09-07 ENCOUNTER — Ambulatory Visit: Payer: Self-pay | Admitting: Cardiology

## 2024-09-10 ENCOUNTER — Telehealth: Payer: Self-pay

## 2024-09-10 NOTE — Telephone Encounter (Signed)
 Pt viewed Echo results on My Chart per Dr. Vanetta Shawl note. Routed to PCP.

## 2024-12-10 ENCOUNTER — Telehealth: Payer: Self-pay

## 2024-12-10 NOTE — Telephone Encounter (Signed)
 Patient called reporting increased claudication and tingling in feet while in bed particularly early in the morning. Patient reported claudication begins after 1/2 mile, he plays pickle ball several times per week and has PT for his Parkinson's disease.    Advised patient to continue his level of activity. Advised to walk until the pain begins then walk a little more before resting.  Will make appt for ABI and PA visit.

## 2024-12-15 ENCOUNTER — Telehealth: Payer: Self-pay | Admitting: *Deleted

## 2024-12-15 MED ORDER — CARVEDILOL 3.125 MG PO TABS: 3.1250 mg | ORAL_TABLET | Freq: Two times a day (BID) | ORAL | 2 refills | Status: AC

## 2024-12-15 NOTE — Telephone Encounter (Signed)
 Rx refill sent to pharmacy.

## 2024-12-16 ENCOUNTER — Other Ambulatory Visit: Payer: Self-pay | Admitting: Cardiology

## 2024-12-18 ENCOUNTER — Other Ambulatory Visit: Payer: Self-pay

## 2024-12-18 DIAGNOSIS — I739 Peripheral vascular disease, unspecified: Secondary | ICD-10-CM

## 2024-12-23 ENCOUNTER — Ambulatory Visit (HOSPITAL_COMMUNITY)

## 2024-12-25 ENCOUNTER — Ambulatory Visit (HOSPITAL_COMMUNITY)

## 2025-01-08 ENCOUNTER — Ambulatory Visit
